# Patient Record
Sex: Female | Born: 1945 | Race: White | Hispanic: No | Marital: Married | Smoking: Never smoker
Health system: Southern US, Community
[De-identification: ages and names within clinical notes are randomized; demographics above are authoritative.]

## PROBLEM LIST (undated history)

## (undated) DIAGNOSIS — F419 Anxiety disorder, unspecified: Secondary | ICD-10-CM

## (undated) DIAGNOSIS — E119 Type 2 diabetes mellitus without complications: Secondary | ICD-10-CM

## (undated) DIAGNOSIS — M542 Cervicalgia: Secondary | ICD-10-CM

## (undated) DIAGNOSIS — H269 Unspecified cataract: Secondary | ICD-10-CM

## (undated) DIAGNOSIS — Z8679 Personal history of other diseases of the circulatory system: Secondary | ICD-10-CM

## (undated) DIAGNOSIS — E7800 Pure hypercholesterolemia, unspecified: Secondary | ICD-10-CM

## (undated) DIAGNOSIS — C801 Malignant (primary) neoplasm, unspecified: Secondary | ICD-10-CM

## (undated) DIAGNOSIS — E039 Hypothyroidism, unspecified: Secondary | ICD-10-CM

## (undated) DIAGNOSIS — K589 Irritable bowel syndrome without diarrhea: Secondary | ICD-10-CM

## (undated) DIAGNOSIS — K56609 Unspecified intestinal obstruction, unspecified as to partial versus complete obstruction: Secondary | ICD-10-CM

## (undated) DIAGNOSIS — K219 Gastro-esophageal reflux disease without esophagitis: Secondary | ICD-10-CM

## (undated) DIAGNOSIS — Z9889 Other specified postprocedural states: Secondary | ICD-10-CM

## (undated) DIAGNOSIS — C4491 Basal cell carcinoma of skin, unspecified: Secondary | ICD-10-CM

## (undated) DIAGNOSIS — K5792 Diverticulitis of intestine, part unspecified, without perforation or abscess without bleeding: Secondary | ICD-10-CM

## (undated) DIAGNOSIS — R519 Headache, unspecified: Secondary | ICD-10-CM

## (undated) DIAGNOSIS — T7840XA Allergy, unspecified, initial encounter: Secondary | ICD-10-CM

## (undated) DIAGNOSIS — M109 Gout, unspecified: Secondary | ICD-10-CM

## (undated) DIAGNOSIS — M47816 Spondylosis without myelopathy or radiculopathy, lumbar region: Secondary | ICD-10-CM

## (undated) DIAGNOSIS — I2510 Atherosclerotic heart disease of native coronary artery without angina pectoris: Secondary | ICD-10-CM

## (undated) DIAGNOSIS — I10 Essential (primary) hypertension: Secondary | ICD-10-CM

## (undated) DIAGNOSIS — I150 Renovascular hypertension: Secondary | ICD-10-CM

## (undated) DIAGNOSIS — Z87442 Personal history of urinary calculi: Secondary | ICD-10-CM

## (undated) HISTORY — DX: Allergy, unspecified, initial encounter: T78.40XA

## (undated) HISTORY — DX: Hypothyroidism, unspecified: E03.9

## (undated) HISTORY — PX: WRIST GANGLION EXCISION: SUR520

## (undated) HISTORY — PX: EXPLORATORY LAPAROTOMY: SUR591

## (undated) HISTORY — DX: Gastro-esophageal reflux disease without esophagitis: K21.9

## (undated) HISTORY — PX: CARDIAC CATHETERIZATION: SHX172

## (undated) HISTORY — DX: Basal cell carcinoma of skin, unspecified: C44.91

## (undated) HISTORY — DX: Irritable bowel syndrome, unspecified: K58.9

## (undated) HISTORY — PX: CHOLECYSTECTOMY: SHX55

## (undated) HISTORY — PX: BREAST BIOPSY: SHX20

## (undated) HISTORY — PX: APPENDECTOMY: SHX54

## (undated) HISTORY — DX: Unspecified cataract: H26.9

## (undated) HISTORY — PX: BLADDER SURGERY: SHX569

## (undated) HISTORY — DX: Gout, unspecified: M10.9

## (undated) HISTORY — PX: WRIST FRACTURE SURGERY: SHX121

## (undated) HISTORY — PX: ABDOMINAL HYSTERECTOMY: SHX81

## (undated) HISTORY — DX: Anxiety disorder, unspecified: F41.9

## (undated) HISTORY — DX: Spondylosis without myelopathy or radiculopathy, lumbar region: M47.816

## (undated) HISTORY — DX: Cervicalgia: M54.2

## (undated) HISTORY — PX: TONSILLECTOMY: SUR1361

## (undated) HISTORY — PX: EYE SURGERY: SHX253

## (undated) HISTORY — PX: FRACTURE SURGERY: SHX138

## (undated) HISTORY — PX: HERNIA REPAIR: SHX51

---

## 1997-08-07 ENCOUNTER — Emergency Department (HOSPITAL_COMMUNITY): Admission: EM | Admit: 1997-08-07 | Discharge: 1997-08-07 | Payer: Self-pay | Admitting: Emergency Medicine

## 1997-09-18 ENCOUNTER — Emergency Department (HOSPITAL_COMMUNITY): Admission: EM | Admit: 1997-09-18 | Discharge: 1997-09-18 | Payer: Self-pay | Admitting: Emergency Medicine

## 1998-05-31 ENCOUNTER — Ambulatory Visit (HOSPITAL_COMMUNITY): Admission: RE | Admit: 1998-05-31 | Discharge: 1998-05-31 | Payer: Self-pay | Admitting: Cardiology

## 1998-05-31 ENCOUNTER — Encounter: Payer: Self-pay | Admitting: Cardiology

## 1999-12-12 ENCOUNTER — Ambulatory Visit (HOSPITAL_COMMUNITY): Admission: RE | Admit: 1999-12-12 | Discharge: 1999-12-12 | Payer: Self-pay | Admitting: Internal Medicine

## 1999-12-12 ENCOUNTER — Encounter: Payer: Self-pay | Admitting: Internal Medicine

## 2000-07-04 ENCOUNTER — Encounter: Admission: RE | Admit: 2000-07-04 | Discharge: 2000-07-04 | Payer: Self-pay | Admitting: *Deleted

## 2000-07-04 ENCOUNTER — Encounter: Payer: Self-pay | Admitting: *Deleted

## 2000-12-18 ENCOUNTER — Encounter: Payer: Self-pay | Admitting: Internal Medicine

## 2000-12-18 ENCOUNTER — Ambulatory Visit (HOSPITAL_COMMUNITY): Admission: RE | Admit: 2000-12-18 | Discharge: 2000-12-18 | Payer: Self-pay | Admitting: Internal Medicine

## 2002-01-04 ENCOUNTER — Encounter: Payer: Self-pay | Admitting: Internal Medicine

## 2002-01-04 ENCOUNTER — Ambulatory Visit (HOSPITAL_COMMUNITY): Admission: RE | Admit: 2002-01-04 | Discharge: 2002-01-04 | Payer: Self-pay | Admitting: Internal Medicine

## 2003-01-16 ENCOUNTER — Emergency Department (HOSPITAL_COMMUNITY): Admission: EM | Admit: 2003-01-16 | Discharge: 2003-01-16 | Payer: Self-pay | Admitting: Emergency Medicine

## 2006-05-01 ENCOUNTER — Ambulatory Visit (HOSPITAL_COMMUNITY): Admission: RE | Admit: 2006-05-01 | Discharge: 2006-05-02 | Payer: Self-pay | Admitting: Surgery

## 2006-06-30 ENCOUNTER — Encounter: Admission: RE | Admit: 2006-06-30 | Discharge: 2006-06-30 | Payer: Self-pay | Admitting: Internal Medicine

## 2010-03-18 ENCOUNTER — Encounter: Payer: Self-pay | Admitting: Surgery

## 2010-03-18 ENCOUNTER — Encounter: Payer: Self-pay | Admitting: Internal Medicine

## 2016-03-27 DIAGNOSIS — Z79899 Other long term (current) drug therapy: Secondary | ICD-10-CM | POA: Diagnosis not present

## 2016-03-27 DIAGNOSIS — D649 Anemia, unspecified: Secondary | ICD-10-CM | POA: Diagnosis not present

## 2016-03-27 DIAGNOSIS — E039 Hypothyroidism, unspecified: Secondary | ICD-10-CM | POA: Diagnosis not present

## 2016-03-27 DIAGNOSIS — E559 Vitamin D deficiency, unspecified: Secondary | ICD-10-CM | POA: Diagnosis not present

## 2016-03-27 DIAGNOSIS — E119 Type 2 diabetes mellitus without complications: Secondary | ICD-10-CM | POA: Diagnosis not present

## 2016-03-27 DIAGNOSIS — E8881 Metabolic syndrome: Secondary | ICD-10-CM | POA: Diagnosis not present

## 2016-03-27 DIAGNOSIS — E785 Hyperlipidemia, unspecified: Secondary | ICD-10-CM | POA: Diagnosis not present

## 2016-04-02 DIAGNOSIS — E1140 Type 2 diabetes mellitus with diabetic neuropathy, unspecified: Secondary | ICD-10-CM | POA: Diagnosis not present

## 2016-04-02 DIAGNOSIS — I10 Essential (primary) hypertension: Secondary | ICD-10-CM | POA: Diagnosis not present

## 2016-04-02 DIAGNOSIS — E559 Vitamin D deficiency, unspecified: Secondary | ICD-10-CM | POA: Diagnosis not present

## 2016-04-02 DIAGNOSIS — E785 Hyperlipidemia, unspecified: Secondary | ICD-10-CM | POA: Diagnosis not present

## 2016-04-02 DIAGNOSIS — G8929 Other chronic pain: Secondary | ICD-10-CM | POA: Diagnosis not present

## 2016-04-02 DIAGNOSIS — Z79899 Other long term (current) drug therapy: Secondary | ICD-10-CM | POA: Diagnosis not present

## 2016-04-02 DIAGNOSIS — E7212 Methylenetetrahydrofolate reductase deficiency: Secondary | ICD-10-CM | POA: Diagnosis not present

## 2016-04-03 DIAGNOSIS — R531 Weakness: Secondary | ICD-10-CM | POA: Diagnosis not present

## 2016-04-03 DIAGNOSIS — M545 Low back pain: Secondary | ICD-10-CM | POA: Diagnosis not present

## 2016-04-10 DIAGNOSIS — R531 Weakness: Secondary | ICD-10-CM | POA: Diagnosis not present

## 2016-04-10 DIAGNOSIS — M545 Low back pain: Secondary | ICD-10-CM | POA: Diagnosis not present

## 2016-04-12 DIAGNOSIS — M545 Low back pain: Secondary | ICD-10-CM | POA: Diagnosis not present

## 2016-04-12 DIAGNOSIS — R531 Weakness: Secondary | ICD-10-CM | POA: Diagnosis not present

## 2016-04-23 DIAGNOSIS — M545 Low back pain: Secondary | ICD-10-CM | POA: Diagnosis not present

## 2016-04-23 DIAGNOSIS — R531 Weakness: Secondary | ICD-10-CM | POA: Diagnosis not present

## 2016-04-25 DIAGNOSIS — M545 Low back pain: Secondary | ICD-10-CM | POA: Diagnosis not present

## 2016-04-25 DIAGNOSIS — R531 Weakness: Secondary | ICD-10-CM | POA: Diagnosis not present

## 2016-05-07 DIAGNOSIS — R531 Weakness: Secondary | ICD-10-CM | POA: Diagnosis not present

## 2016-05-07 DIAGNOSIS — M545 Low back pain: Secondary | ICD-10-CM | POA: Diagnosis not present

## 2016-05-13 DIAGNOSIS — K5730 Diverticulosis of large intestine without perforation or abscess without bleeding: Secondary | ICD-10-CM | POA: Diagnosis not present

## 2016-05-13 DIAGNOSIS — N200 Calculus of kidney: Secondary | ICD-10-CM | POA: Diagnosis not present

## 2016-05-13 DIAGNOSIS — R1084 Generalized abdominal pain: Secondary | ICD-10-CM | POA: Diagnosis not present

## 2016-05-14 DIAGNOSIS — R531 Weakness: Secondary | ICD-10-CM | POA: Diagnosis not present

## 2016-05-14 DIAGNOSIS — M545 Low back pain: Secondary | ICD-10-CM | POA: Diagnosis not present

## 2016-05-18 DIAGNOSIS — M545 Low back pain: Secondary | ICD-10-CM | POA: Diagnosis not present

## 2016-05-18 DIAGNOSIS — R531 Weakness: Secondary | ICD-10-CM | POA: Diagnosis not present

## 2016-05-23 DIAGNOSIS — R531 Weakness: Secondary | ICD-10-CM | POA: Diagnosis not present

## 2016-05-23 DIAGNOSIS — M545 Low back pain: Secondary | ICD-10-CM | POA: Diagnosis not present

## 2016-05-27 DIAGNOSIS — M5126 Other intervertebral disc displacement, lumbar region: Secondary | ICD-10-CM | POA: Diagnosis not present

## 2016-05-27 DIAGNOSIS — M438X6 Other specified deforming dorsopathies, lumbar region: Secondary | ICD-10-CM | POA: Diagnosis not present

## 2016-05-27 DIAGNOSIS — M4696 Unspecified inflammatory spondylopathy, lumbar region: Secondary | ICD-10-CM | POA: Diagnosis not present

## 2016-05-30 DIAGNOSIS — M545 Low back pain: Secondary | ICD-10-CM | POA: Diagnosis not present

## 2016-05-30 DIAGNOSIS — M4155 Other secondary scoliosis, thoracolumbar region: Secondary | ICD-10-CM | POA: Diagnosis not present

## 2016-05-30 DIAGNOSIS — M542 Cervicalgia: Secondary | ICD-10-CM | POA: Diagnosis not present

## 2016-05-30 DIAGNOSIS — M4036 Flatback syndrome, lumbar region: Secondary | ICD-10-CM | POA: Diagnosis not present

## 2016-06-04 DIAGNOSIS — R1084 Generalized abdominal pain: Secondary | ICD-10-CM | POA: Diagnosis not present

## 2016-06-04 DIAGNOSIS — K5730 Diverticulosis of large intestine without perforation or abscess without bleeding: Secondary | ICD-10-CM | POA: Diagnosis not present

## 2016-06-05 DIAGNOSIS — R531 Weakness: Secondary | ICD-10-CM | POA: Diagnosis not present

## 2016-06-05 DIAGNOSIS — M545 Low back pain: Secondary | ICD-10-CM | POA: Diagnosis not present

## 2016-06-19 DIAGNOSIS — M79604 Pain in right leg: Secondary | ICD-10-CM | POA: Diagnosis not present

## 2016-07-31 DIAGNOSIS — L304 Erythema intertrigo: Secondary | ICD-10-CM | POA: Diagnosis not present

## 2016-07-31 DIAGNOSIS — L570 Actinic keratosis: Secondary | ICD-10-CM | POA: Diagnosis not present

## 2016-07-31 DIAGNOSIS — B351 Tinea unguium: Secondary | ICD-10-CM | POA: Diagnosis not present

## 2016-07-31 DIAGNOSIS — L820 Inflamed seborrheic keratosis: Secondary | ICD-10-CM | POA: Diagnosis not present

## 2016-07-31 DIAGNOSIS — L728 Other follicular cysts of the skin and subcutaneous tissue: Secondary | ICD-10-CM | POA: Diagnosis not present

## 2016-08-08 DIAGNOSIS — M1288 Other specific arthropathies, not elsewhere classified, other specified site: Secondary | ICD-10-CM | POA: Diagnosis not present

## 2016-08-08 DIAGNOSIS — N959 Unspecified menopausal and perimenopausal disorder: Secondary | ICD-10-CM | POA: Diagnosis not present

## 2016-08-08 DIAGNOSIS — Z01419 Encounter for gynecological examination (general) (routine) without abnormal findings: Secondary | ICD-10-CM | POA: Diagnosis not present

## 2016-08-08 DIAGNOSIS — I10 Essential (primary) hypertension: Secondary | ICD-10-CM | POA: Diagnosis not present

## 2016-08-13 DIAGNOSIS — M47816 Spondylosis without myelopathy or radiculopathy, lumbar region: Secondary | ICD-10-CM | POA: Diagnosis not present

## 2016-08-13 DIAGNOSIS — M545 Low back pain: Secondary | ICD-10-CM | POA: Diagnosis not present

## 2016-08-13 DIAGNOSIS — G8929 Other chronic pain: Secondary | ICD-10-CM | POA: Diagnosis not present

## 2016-08-13 DIAGNOSIS — M4126 Other idiopathic scoliosis, lumbar region: Secondary | ICD-10-CM | POA: Diagnosis not present

## 2016-08-15 DIAGNOSIS — G8929 Other chronic pain: Secondary | ICD-10-CM | POA: Diagnosis not present

## 2016-08-15 DIAGNOSIS — M47816 Spondylosis without myelopathy or radiculopathy, lumbar region: Secondary | ICD-10-CM | POA: Diagnosis not present

## 2016-09-02 DIAGNOSIS — M47816 Spondylosis without myelopathy or radiculopathy, lumbar region: Secondary | ICD-10-CM | POA: Diagnosis not present

## 2016-09-02 DIAGNOSIS — G8929 Other chronic pain: Secondary | ICD-10-CM | POA: Diagnosis not present

## 2016-09-25 DIAGNOSIS — E559 Vitamin D deficiency, unspecified: Secondary | ICD-10-CM | POA: Diagnosis not present

## 2016-09-25 DIAGNOSIS — E1140 Type 2 diabetes mellitus with diabetic neuropathy, unspecified: Secondary | ICD-10-CM | POA: Diagnosis not present

## 2016-09-27 DIAGNOSIS — E559 Vitamin D deficiency, unspecified: Secondary | ICD-10-CM | POA: Diagnosis not present

## 2016-09-27 DIAGNOSIS — E1165 Type 2 diabetes mellitus with hyperglycemia: Secondary | ICD-10-CM | POA: Diagnosis not present

## 2016-09-27 DIAGNOSIS — E063 Autoimmune thyroiditis: Secondary | ICD-10-CM | POA: Diagnosis not present

## 2016-09-27 DIAGNOSIS — E039 Hypothyroidism, unspecified: Secondary | ICD-10-CM | POA: Diagnosis not present

## 2016-09-27 DIAGNOSIS — E049 Nontoxic goiter, unspecified: Secondary | ICD-10-CM | POA: Diagnosis not present

## 2016-09-27 DIAGNOSIS — Z6831 Body mass index (BMI) 31.0-31.9, adult: Secondary | ICD-10-CM | POA: Diagnosis not present

## 2016-09-30 DIAGNOSIS — M542 Cervicalgia: Secondary | ICD-10-CM | POA: Diagnosis not present

## 2016-09-30 DIAGNOSIS — M47812 Spondylosis without myelopathy or radiculopathy, cervical region: Secondary | ICD-10-CM | POA: Diagnosis not present

## 2016-10-02 DIAGNOSIS — Z0000 Encounter for general adult medical examination without abnormal findings: Secondary | ICD-10-CM | POA: Diagnosis not present

## 2016-10-02 DIAGNOSIS — I10 Essential (primary) hypertension: Secondary | ICD-10-CM | POA: Diagnosis not present

## 2016-10-02 DIAGNOSIS — M62830 Muscle spasm of back: Secondary | ICD-10-CM | POA: Diagnosis not present

## 2016-10-02 DIAGNOSIS — R609 Edema, unspecified: Secondary | ICD-10-CM | POA: Diagnosis not present

## 2016-10-02 DIAGNOSIS — H04129 Dry eye syndrome of unspecified lacrimal gland: Secondary | ICD-10-CM | POA: Diagnosis not present

## 2016-10-02 DIAGNOSIS — E785 Hyperlipidemia, unspecified: Secondary | ICD-10-CM | POA: Diagnosis not present

## 2016-10-03 DIAGNOSIS — R609 Edema, unspecified: Secondary | ICD-10-CM | POA: Diagnosis not present

## 2016-10-03 DIAGNOSIS — E785 Hyperlipidemia, unspecified: Secondary | ICD-10-CM | POA: Diagnosis not present

## 2016-10-03 DIAGNOSIS — I10 Essential (primary) hypertension: Secondary | ICD-10-CM | POA: Diagnosis not present

## 2016-10-03 DIAGNOSIS — M62830 Muscle spasm of back: Secondary | ICD-10-CM | POA: Diagnosis not present

## 2016-10-18 DIAGNOSIS — L508 Other urticaria: Secondary | ICD-10-CM | POA: Diagnosis not present

## 2016-10-18 DIAGNOSIS — L728 Other follicular cysts of the skin and subcutaneous tissue: Secondary | ICD-10-CM | POA: Diagnosis not present

## 2016-10-31 DIAGNOSIS — G4489 Other headache syndrome: Secondary | ICD-10-CM | POA: Diagnosis not present

## 2016-10-31 DIAGNOSIS — M47812 Spondylosis without myelopathy or radiculopathy, cervical region: Secondary | ICD-10-CM | POA: Diagnosis not present

## 2016-10-31 DIAGNOSIS — M542 Cervicalgia: Secondary | ICD-10-CM | POA: Diagnosis not present

## 2016-10-31 DIAGNOSIS — R51 Headache: Secondary | ICD-10-CM | POA: Diagnosis not present

## 2016-11-04 DIAGNOSIS — E039 Hypothyroidism, unspecified: Secondary | ICD-10-CM | POA: Diagnosis not present

## 2016-11-20 DIAGNOSIS — G43011 Migraine without aura, intractable, with status migrainosus: Secondary | ICD-10-CM | POA: Diagnosis not present

## 2016-11-20 DIAGNOSIS — M9901 Segmental and somatic dysfunction of cervical region: Secondary | ICD-10-CM | POA: Diagnosis not present

## 2016-12-10 DIAGNOSIS — M531 Cervicobrachial syndrome: Secondary | ICD-10-CM | POA: Diagnosis not present

## 2016-12-10 DIAGNOSIS — M9901 Segmental and somatic dysfunction of cervical region: Secondary | ICD-10-CM | POA: Diagnosis not present

## 2016-12-31 DIAGNOSIS — H10412 Chronic giant papillary conjunctivitis, left eye: Secondary | ICD-10-CM | POA: Diagnosis not present

## 2016-12-31 DIAGNOSIS — H1045 Other chronic allergic conjunctivitis: Secondary | ICD-10-CM | POA: Diagnosis not present

## 2016-12-31 DIAGNOSIS — Z961 Presence of intraocular lens: Secondary | ICD-10-CM | POA: Diagnosis not present

## 2017-01-07 DIAGNOSIS — E039 Hypothyroidism, unspecified: Secondary | ICD-10-CM | POA: Diagnosis not present

## 2017-02-03 DIAGNOSIS — E039 Hypothyroidism, unspecified: Secondary | ICD-10-CM | POA: Diagnosis not present

## 2017-02-05 DIAGNOSIS — Z961 Presence of intraocular lens: Secondary | ICD-10-CM | POA: Diagnosis not present

## 2017-02-05 DIAGNOSIS — H1045 Other chronic allergic conjunctivitis: Secondary | ICD-10-CM | POA: Diagnosis not present

## 2017-02-05 DIAGNOSIS — H10403 Unspecified chronic conjunctivitis, bilateral: Secondary | ICD-10-CM | POA: Diagnosis not present

## 2017-02-05 DIAGNOSIS — Z9841 Cataract extraction status, right eye: Secondary | ICD-10-CM | POA: Diagnosis not present

## 2017-02-10 DIAGNOSIS — E559 Vitamin D deficiency, unspecified: Secondary | ICD-10-CM | POA: Diagnosis not present

## 2017-02-10 DIAGNOSIS — E049 Nontoxic goiter, unspecified: Secondary | ICD-10-CM | POA: Diagnosis not present

## 2017-02-10 DIAGNOSIS — Z6830 Body mass index (BMI) 30.0-30.9, adult: Secondary | ICD-10-CM | POA: Diagnosis not present

## 2017-02-10 DIAGNOSIS — E039 Hypothyroidism, unspecified: Secondary | ICD-10-CM | POA: Diagnosis not present

## 2017-02-10 DIAGNOSIS — E1165 Type 2 diabetes mellitus with hyperglycemia: Secondary | ICD-10-CM | POA: Diagnosis not present

## 2017-02-10 DIAGNOSIS — E063 Autoimmune thyroiditis: Secondary | ICD-10-CM | POA: Diagnosis not present

## 2017-03-12 DIAGNOSIS — Z1231 Encounter for screening mammogram for malignant neoplasm of breast: Secondary | ICD-10-CM | POA: Diagnosis not present

## 2017-03-24 DIAGNOSIS — E039 Hypothyroidism, unspecified: Secondary | ICD-10-CM | POA: Diagnosis not present

## 2017-04-03 DIAGNOSIS — H538 Other visual disturbances: Secondary | ICD-10-CM | POA: Diagnosis not present

## 2017-04-03 DIAGNOSIS — H10413 Chronic giant papillary conjunctivitis, bilateral: Secondary | ICD-10-CM | POA: Diagnosis not present

## 2017-04-30 DIAGNOSIS — B351 Tinea unguium: Secondary | ICD-10-CM | POA: Diagnosis not present

## 2017-04-30 DIAGNOSIS — L570 Actinic keratosis: Secondary | ICD-10-CM | POA: Diagnosis not present

## 2017-04-30 DIAGNOSIS — L650 Telogen effluvium: Secondary | ICD-10-CM | POA: Diagnosis not present

## 2017-04-30 DIAGNOSIS — L728 Other follicular cysts of the skin and subcutaneous tissue: Secondary | ICD-10-CM | POA: Diagnosis not present

## 2017-06-23 DIAGNOSIS — R109 Unspecified abdominal pain: Secondary | ICD-10-CM | POA: Diagnosis not present

## 2017-06-23 DIAGNOSIS — Z6830 Body mass index (BMI) 30.0-30.9, adult: Secondary | ICD-10-CM | POA: Diagnosis not present

## 2017-06-23 DIAGNOSIS — K5792 Diverticulitis of intestine, part unspecified, without perforation or abscess without bleeding: Secondary | ICD-10-CM | POA: Diagnosis not present

## 2017-06-26 ENCOUNTER — Emergency Department (HOSPITAL_COMMUNITY)
Admission: EM | Admit: 2017-06-26 | Discharge: 2017-06-26 | Disposition: A | Discharge status: Home / Self Care | Payer: Medicare Other | Attending: Emergency Medicine | Admitting: Emergency Medicine

## 2017-06-26 ENCOUNTER — Encounter (HOSPITAL_COMMUNITY): Payer: Self-pay | Admitting: Emergency Medicine

## 2017-06-26 ENCOUNTER — Emergency Department (HOSPITAL_COMMUNITY): Payer: Medicare Other

## 2017-06-26 DIAGNOSIS — R197 Diarrhea, unspecified: Secondary | ICD-10-CM

## 2017-06-26 DIAGNOSIS — Z79899 Other long term (current) drug therapy: Secondary | ICD-10-CM | POA: Insufficient documentation

## 2017-06-26 DIAGNOSIS — R1032 Left lower quadrant pain: Secondary | ICD-10-CM | POA: Diagnosis not present

## 2017-06-26 DIAGNOSIS — N200 Calculus of kidney: Secondary | ICD-10-CM | POA: Diagnosis not present

## 2017-06-26 DIAGNOSIS — R112 Nausea with vomiting, unspecified: Secondary | ICD-10-CM | POA: Diagnosis not present

## 2017-06-26 DIAGNOSIS — E119 Type 2 diabetes mellitus without complications: Secondary | ICD-10-CM | POA: Diagnosis not present

## 2017-06-26 DIAGNOSIS — I10 Essential (primary) hypertension: Secondary | ICD-10-CM | POA: Insufficient documentation

## 2017-06-26 DIAGNOSIS — A084 Viral intestinal infection, unspecified: Secondary | ICD-10-CM | POA: Diagnosis not present

## 2017-06-26 DIAGNOSIS — Z91040 Latex allergy status: Secondary | ICD-10-CM | POA: Diagnosis not present

## 2017-06-26 DIAGNOSIS — Z7984 Long term (current) use of oral hypoglycemic drugs: Secondary | ICD-10-CM | POA: Diagnosis not present

## 2017-06-26 HISTORY — DX: Type 2 diabetes mellitus without complications: E11.9

## 2017-06-26 HISTORY — DX: Pure hypercholesterolemia, unspecified: E78.00

## 2017-06-26 HISTORY — DX: Essential (primary) hypertension: I10

## 2017-06-26 HISTORY — DX: Diverticulitis of intestine, part unspecified, without perforation or abscess without bleeding: K57.92

## 2017-06-26 HISTORY — DX: Unspecified intestinal obstruction, unspecified as to partial versus complete obstruction: K56.609

## 2017-06-26 LAB — CBC
HCT: 42.6 % (ref 36.0–46.0)
Hemoglobin: 14.3 g/dL (ref 12.0–15.0)
MCH: 29.5 pg (ref 26.0–34.0)
MCHC: 33.6 g/dL (ref 30.0–36.0)
MCV: 88 fL (ref 78.0–100.0)
Platelets: 321 10*3/uL (ref 150–400)
RBC: 4.84 MIL/uL (ref 3.87–5.11)
RDW: 13.5 % (ref 11.5–15.5)
WBC: 10.1 10*3/uL (ref 4.0–10.5)

## 2017-06-26 LAB — COMPREHENSIVE METABOLIC PANEL
ALT: 16 U/L (ref 14–54)
AST: 24 U/L (ref 15–41)
Albumin: 4.3 g/dL (ref 3.5–5.0)
Alkaline Phosphatase: 88 U/L (ref 38–126)
Anion gap: 12 (ref 5–15)
BUN: 18 mg/dL (ref 6–20)
CO2: 22 mmol/L (ref 22–32)
Calcium: 10.3 mg/dL (ref 8.9–10.3)
Chloride: 102 mmol/L (ref 101–111)
Creatinine, Ser: 0.8 mg/dL (ref 0.44–1.00)
GFR calc Af Amer: >60 mL/min (ref >60)
GFR calc non Af Amer: >60 mL/min (ref >60)
Glucose, Bld: 109 mg/dL — ABNORMAL HIGH (ref 65–99)
Potassium: 4.2 mmol/L (ref 3.5–5.1)
Sodium: 136 mmol/L (ref 135–145)
Total Bilirubin: 1.1 mg/dL (ref 0.3–1.2)
Total Protein: 8.4 g/dL — ABNORMAL HIGH (ref 6.5–8.1)

## 2017-06-26 LAB — URINALYSIS, ROUTINE W REFLEX MICROSCOPIC
Bilirubin Urine: NEGATIVE
Glucose, UA: NEGATIVE mg/dL
Ketones, ur: 20 mg/dL — AB
Nitrite: NEGATIVE
Protein, ur: NEGATIVE mg/dL
RBC / HPF: >50 RBC/hpf — ABNORMAL HIGH (ref 0–5)
Specific Gravity, Urine: 1.018 (ref 1.005–1.030)
pH: 5 (ref 5.0–8.0)

## 2017-06-26 LAB — LIPASE, BLOOD: Lipase: 31 U/L (ref 11–51)

## 2017-06-26 MED ORDER — LORAZEPAM 2 MG/ML IJ SOLN
0.5000 mg | Freq: Once | INTRAMUSCULAR | Status: DC
  Filled 2017-06-26: qty 1

## 2017-06-26 MED ORDER — METOCLOPRAMIDE HCL 5 MG/ML IJ SOLN
10.0000 mg | Freq: Once | INTRAMUSCULAR | Status: AC
  Administered 2017-06-26: 10 mg via INTRAVENOUS
  Filled 2017-06-26: qty 2

## 2017-06-26 MED ORDER — ONDANSETRON 4 MG PO TBDP
4.0000 mg | ORAL_TABLET | Freq: Once | ORAL | Status: AC | PRN
  Administered 2017-06-26: 4 mg via ORAL
  Filled 2017-06-26: qty 1

## 2017-06-26 MED ORDER — ONDANSETRON HCL 4 MG/2ML IJ SOLN
4.0000 mg | Freq: Once | INTRAMUSCULAR | Status: AC
  Administered 2017-06-26: 4 mg via INTRAVENOUS
  Filled 2017-06-26: qty 2

## 2017-06-26 MED ORDER — IOPAMIDOL (ISOVUE-300) INJECTION 61%
INTRAVENOUS | Status: AC
  Filled 2017-06-26: qty 100

## 2017-06-26 MED ORDER — ONDANSETRON 4 MG PO TBDP: 4.0000 mg | ORAL_TABLET | Freq: Three times a day (TID) | ORAL | 0 refills | Status: DC | PRN

## 2017-06-26 MED ORDER — MORPHINE SULFATE (PF) 4 MG/ML IV SOLN
4.0000 mg | Freq: Once | INTRAVENOUS | Status: AC
  Administered 2017-06-26: 4 mg via INTRAVENOUS
  Filled 2017-06-26: qty 1

## 2017-06-26 MED ORDER — SODIUM CHLORIDE 0.9 % IJ SOLN
INTRAMUSCULAR | Status: AC
  Filled 2017-06-26: qty 50

## 2017-06-26 MED ORDER — SODIUM CHLORIDE 0.9 % IV BOLUS
1000.0000 mL | Freq: Once | INTRAVENOUS | Status: AC
Start: 2017-06-26 — End: 2017-06-26
  Administered 2017-06-26: 1000 mL via INTRAVENOUS

## 2017-06-26 NOTE — ED Provider Notes (Signed)
[facility] DEPT Provider Note   CSN: [id number] Arrival date & time: 06/26/17  1446     History   Chief Complaint Chief Complaint  Patient presents with  . Abdominal Pain  . Emesis    HPI Jillian [name] is a 72 y.o. female.  HPI   Hx of diverticulitis and SBO Started flagyl/doxycycline, bentyl on Monday due to concern for possible diverticulitis Has had diarrhea, loud stomach sounds, is passing flatus Abdominal pain is diffuse, worse on the left, 7/10 pain Began Friday, has continued and worsened Nausea started yesterday, threw up a few times, no blood, no black or bloody stools No fever, no urinary symptoms or dysuria Diarrhea every hour, is calming down some today compared to yesterday  Past Medical History:  Diagnosis Date  . Bowel obstruction ([place])   . Diabetes mellitus without complication ([place])   . Diverticulitis   . High cholesterol   . Hypertension     There are no active problems to display for this patient.   Past Surgical History:  Procedure Laterality Date  . ABDOMINAL HYSTERECTOMY    . CARDIAC CATHETERIZATION    . CHOLECYSTECTOMY    . FRACTURE SURGERY    . HERNIA REPAIR    . TONSILLECTOMY    . WRIST FRACTURE SURGERY Right      OB History   None      Home Medications    Prior to Admission medications   Medication Sig Start Date End Date Taking? Authorizing Provider  cetirizine (ZYRTEC) 10 MG tablet Take 10 mg by mouth daily as needed for allergies.   Yes [provider]  cyclobenzaprine (FLEXERIL) 10 MG tablet Take 10 mg by mouth 3 (three) times daily as needed for muscle spasms.   Yes [provider]  Fexofenadine HCl (ALLEGRA ALLERGY PO) Take 1 tablet by mouth daily as needed (allergies).   Yes [provider]  ibuprofen (ADVIL,MOTRIN) 200 MG tablet Take 200 mg by mouth 3 (three) times daily as needed for moderate pain.   Yes [provider]  levothyroxine (SYNTHROID,  LEVOTHROID) 200 MCG tablet Take 200 mcg by mouth as directed. Take 1 tablet on Mon thru Sat and Hold on Sunday.   Yes [provider]  metFORMIN (GLUCOPHAGE-XR) 500 MG 24 hr tablet Take 1,000 mg by mouth 2 (two) times daily.   Yes [provider]  rosuvastatin (CRESTOR) 20 MG tablet Take 20 mg by mouth daily.   Yes [provider]  telmisartan (MICARDIS) 40 MG tablet Take 40 mg by mouth daily.   Yes [provider]  ondansetron (ZOFRAN ODT) 4 MG disintegrating tablet Take 1 tablet (4 mg total) by mouth every 8 (eight) hours as needed for nausea or vomiting. 06/26/17   Gareth Morgan, MD    Family History No family history on file.  Social History Social History   Tobacco Use  . Smoking status: Never Smoker  . Smokeless tobacco: Never Used  Substance Use Topics  . Alcohol use: Not on file  . Drug use: Not on file     Allergies   Amoxicillin; Iodinated diagnostic agents; Luminal [phenobarbital]; Mobic [meloxicam]; Penicillins; Allopurinol; Ciprofloxacin; and Latex   Review of Systems Review of Systems  Constitutional: Negative for fever.  HENT: Negative for sore throat.   Eyes: Negative for visual disturbance.  Respiratory: Negative for cough and shortness of breath.   Cardiovascular: Negative for chest pain.  Gastrointestinal: Positive for abdominal pain, diarrhea, nausea and vomiting.  Genitourinary: Negative for difficulty urinating and dysuria.  Musculoskeletal: Negative for back pain and neck pain.  Skin: Negative for rash.  Neurological: Negative for syncope and headaches.     Physical Exam Updated Vital Signs BP (!) 156/70 (BP Location: Right Arm)   Pulse 90   Temp 97.8 F (36.6 C) (Oral)   Resp 18   Ht 5\' 4"  (1.626 m)   Wt 78.9 kg (174 lb)   SpO2 100%   BMI 29.87 kg/m   Physical Exam  Constitutional: She is oriented to person, place, and time. She appears well-developed and well-nourished. No distress.  HENT:  Head:  Normocephalic and atraumatic.  Eyes: Conjunctivae and EOM are normal.  Neck: Normal range of motion.  Cardiovascular: Normal rate, regular rhythm, normal heart sounds and intact distal pulses. Exam reveals no gallop and no friction rub.  No murmur heard. Pulmonary/Chest: Effort normal and breath sounds normal. No respiratory distress. She has no wheezes. She has no rales.  Abdominal: Soft. She exhibits no distension. There is tenderness in the suprapubic area and left lower quadrant. There is guarding.  Musculoskeletal: She exhibits no edema or tenderness.  Neurological: She is alert and oriented to person, place, and time.  Skin: Skin is warm and dry. No rash noted. She is not diaphoretic. No erythema.  Nursing note and vitals reviewed.    ED Treatments / Results  Labs (all labs ordered are listed, but only abnormal results are displayed) Labs Reviewed  COMPREHENSIVE METABOLIC PANEL - Abnormal; Notable for the following components:      Result Value   Glucose, Bld 109 (*)    Total Protein 8.4 (*)    All other components within normal limits  URINALYSIS, ROUTINE W REFLEX MICROSCOPIC - Abnormal; Notable for the following components:   APPearance HAZY (*)    Hgb urine dipstick LARGE (*)    Ketones, ur 20 (*)    Leukocytes, UA TRACE (*)    RBC / HPF >50 (*)    Bacteria, UA RARE (*)    All other components within normal limits  URINE CULTURE  LIPASE, BLOOD  CBC    EKG None  Radiology Ct Abdomen Pelvis Wo Contrast  Result Date: 06/26/2017 CLINICAL DATA:  Pt reports abd pains with n/v/d started on Monday. Was started on doxycyline, flagyl and bentyl Monday night. Hx diverticulitis and bowel obstruction. PO Barium given EXAM: CT ABDOMEN AND PELVIS WITHOUT CONTRAST TECHNIQUE: Multidetector CT imaging of the abdomen and pelvis was performed following the standard protocol without IV contrast. COMPARISON:  01/16/2003 FINDINGS: Lower chest: Heart size is normal. No pericardial effusion  or significant coronary artery calcifications. No pulmonary nodules, pleural effusions, or infiltrates. Hepatobiliary: Status post cholecystectomy. The liver is homogeneous. There is focal fatty infiltration adjacent to the falciform ligament. No suspicious liver lesions. Pancreas: Unremarkable. No pancreatic ductal dilatation or surrounding inflammatory changes. Spleen: Normal in size without focal abnormality. Adrenals/Urinary Tract: The adrenal glands are normal in appearance. There are intrarenal calculi bilaterally, largest measuring approximately 7 millimeters in the UPPER pole of the RIGHT kidney. There is no hydronephrosis. Urinary bladder is unremarkable. Stomach/Bowel: The stomach and small bowel loops are normal in appearance. There is significant colonic diverticulosis, particularly involving the sigmoid colon. There is no associated inflammation or abscess. Vascular/Lymphatic: There is atherosclerotic associated with aneurysm calcification of the abdominal aorta not. No retroperitoneal or mesenteric adenopathy. Reproductive: Hysterectomy.  No adnexal mass. Other: No free pelvic fluid. Interval anterior abdominal wall hernia repair. Musculoskeletal: Scoliosis  and degenerative changes in the lumbar spine. There are degenerative changes in both hips. No suspicious lytic or blastic lesions are identified. IMPRESSION: 1. Nephrolithiasis.  No obstructing stone. 2. Colonic diverticulosis without acute diverticulitis. 3.  Aortic atherosclerosis.  (ICD10-I70.0) 4. Hysterectomy. 5. Scoliosis and lumbar spondylosis. Degenerative changes in both hips. Electronically Signed   By: Nolon Nations M.D.   On: 06/26/2017 21:09    Procedures Procedures (including critical care time)  Medications Ordered in ED Medications  sodium chloride 0.9 % injection (has no administration in time range)  LORazepam (ATIVAN) injection 0.5 mg (0.5 mg Intravenous Refused 06/26/17 2200)  ondansetron (ZOFRAN-ODT) disintegrating  tablet 4 mg (4 mg Oral Given 06/26/17 1527)  sodium chloride 0.9 % bolus 1,000 mL (0 mLs Intravenous Stopped 06/26/17 2301)  morphine 4 MG/ML injection 4 mg (4 mg Intravenous Given 06/26/17 1821)  ondansetron (ZOFRAN) injection 4 mg (4 mg Intravenous Given 06/26/17 1821)  metoCLOPramide (REGLAN) injection 10 mg (10 mg Intravenous Given 06/26/17 2003)  ondansetron (ZOFRAN-ODT) disintegrating tablet 4 mg (4 mg Oral Given 06/26/17 2200)     Initial Impression / Assessment and Plan / ED Course  I have reviewed the triage vital signs and the nursing notes.  Pertinent labs & imaging results that were available during my care of the patient were reviewed by me and considered in my medical decision making (see chart for details).     72 year old female with a history of diabetes, diverticulitis, SBO, hypertension, hyperlipidemia presents with concern for abdominal pain.  Patient had been started on Flagyl, doxycycline for empiric treatment of diverticulitis as an outpatient on Monday, however has had worsening symptoms.  Exam concerning for persisting diverticulitis.  UA without signs of infection. No sign of pancreatitis or hepatitis.  Labs show no significant findings.  CT abdomen pelvis done shows no sign of diverticulitis. Shows nephrolithiasis without obstructing stone.    Suspect infectious etiology of nausea, vomiting, diarrhea and abdominal pain in setting of CT without acute findings.  Recommend discontinuing abx given concern some of her symptoms may be side effects to abx, and possibility this could be viral gastroenteritis. However, discussed if she continues to have symptoms on Monday to have her PCP send stool studies.  Discussed reasons to return in detail.  Tolerating po. Given rx for zofran. Patient discharged in stable condition with understanding of reasons to return.   Final Clinical Impressions(s) / ED Diagnoses   Final diagnoses:  Nausea and vomiting, intractability of vomiting not  specified, unspecified vomiting type  Left lower quadrant pain  Diarrhea of presumed infectious origin    ED Discharge Orders        Ordered    ondansetron (ZOFRAN ODT) 4 MG disintegrating tablet  Every 8 hours PRN     06/26/17 [place], [place], MD 06/27/17 [phone]

## 2017-06-26 NOTE — ED Notes (Signed)
Pt has tried to give a urine sample but has episodes of diarrhea.

## 2017-06-26 NOTE — ED Triage Notes (Signed)
Pt reports abd pains with n/v/d started on Monday. Was started on doxycyline, flagyl and bentyl Monday night. Hx diverticulitis and bowel obstruction.

## 2017-06-26 NOTE — ED Notes (Signed)
Patient reports vomiting despite getting reglan, provider made aware.

## 2017-06-26 NOTE — ED Notes (Signed)
Patient transported to CT 

## 2017-06-28 LAB — URINE CULTURE

## 2017-07-23 DIAGNOSIS — M47816 Spondylosis without myelopathy or radiculopathy, lumbar region: Secondary | ICD-10-CM | POA: Diagnosis not present

## 2017-07-23 DIAGNOSIS — E038 Other specified hypothyroidism: Secondary | ICD-10-CM | POA: Diagnosis not present

## 2017-07-23 DIAGNOSIS — Z6829 Body mass index (BMI) 29.0-29.9, adult: Secondary | ICD-10-CM | POA: Diagnosis not present

## 2017-07-23 DIAGNOSIS — I10 Essential (primary) hypertension: Secondary | ICD-10-CM | POA: Diagnosis not present

## 2017-07-23 DIAGNOSIS — Z1389 Encounter for screening for other disorder: Secondary | ICD-10-CM | POA: Diagnosis not present

## 2017-07-23 DIAGNOSIS — E1169 Type 2 diabetes mellitus with other specified complication: Secondary | ICD-10-CM | POA: Diagnosis not present

## 2017-07-23 DIAGNOSIS — E7849 Other hyperlipidemia: Secondary | ICD-10-CM | POA: Diagnosis not present

## 2017-07-23 DIAGNOSIS — Z8719 Personal history of other diseases of the digestive system: Secondary | ICD-10-CM | POA: Diagnosis not present

## 2017-08-11 DIAGNOSIS — M47812 Spondylosis without myelopathy or radiculopathy, cervical region: Secondary | ICD-10-CM | POA: Diagnosis not present

## 2017-08-11 DIAGNOSIS — M542 Cervicalgia: Secondary | ICD-10-CM | POA: Diagnosis not present

## 2017-08-11 DIAGNOSIS — M47816 Spondylosis without myelopathy or radiculopathy, lumbar region: Secondary | ICD-10-CM | POA: Diagnosis not present

## 2017-09-08 DIAGNOSIS — I10 Essential (primary) hypertension: Secondary | ICD-10-CM | POA: Diagnosis not present

## 2017-09-08 DIAGNOSIS — M47816 Spondylosis without myelopathy or radiculopathy, lumbar region: Secondary | ICD-10-CM | POA: Diagnosis not present

## 2017-09-08 DIAGNOSIS — M542 Cervicalgia: Secondary | ICD-10-CM | POA: Diagnosis not present

## 2017-09-08 DIAGNOSIS — M5481 Occipital neuralgia: Secondary | ICD-10-CM | POA: Diagnosis not present

## 2017-09-08 DIAGNOSIS — Z6830 Body mass index (BMI) 30.0-30.9, adult: Secondary | ICD-10-CM | POA: Diagnosis not present

## 2017-10-02 DIAGNOSIS — E049 Nontoxic goiter, unspecified: Secondary | ICD-10-CM | POA: Diagnosis not present

## 2017-10-02 DIAGNOSIS — E063 Autoimmune thyroiditis: Secondary | ICD-10-CM | POA: Diagnosis not present

## 2017-10-02 DIAGNOSIS — E559 Vitamin D deficiency, unspecified: Secondary | ICD-10-CM | POA: Diagnosis not present

## 2017-10-02 DIAGNOSIS — E039 Hypothyroidism, unspecified: Secondary | ICD-10-CM | POA: Diagnosis not present

## 2017-10-02 DIAGNOSIS — E1165 Type 2 diabetes mellitus with hyperglycemia: Secondary | ICD-10-CM | POA: Diagnosis not present

## 2017-10-02 DIAGNOSIS — Z6830 Body mass index (BMI) 30.0-30.9, adult: Secondary | ICD-10-CM | POA: Diagnosis not present

## 2017-10-17 DIAGNOSIS — M542 Cervicalgia: Secondary | ICD-10-CM | POA: Diagnosis not present

## 2017-10-17 DIAGNOSIS — I10 Essential (primary) hypertension: Secondary | ICD-10-CM | POA: Diagnosis not present

## 2017-10-17 DIAGNOSIS — Z6831 Body mass index (BMI) 31.0-31.9, adult: Secondary | ICD-10-CM | POA: Diagnosis not present

## 2017-12-10 DIAGNOSIS — I10 Essential (primary) hypertension: Secondary | ICD-10-CM | POA: Diagnosis not present

## 2017-12-10 DIAGNOSIS — E038 Other specified hypothyroidism: Secondary | ICD-10-CM | POA: Diagnosis not present

## 2017-12-10 DIAGNOSIS — E1169 Type 2 diabetes mellitus with other specified complication: Secondary | ICD-10-CM | POA: Diagnosis not present

## 2017-12-10 DIAGNOSIS — Z23 Encounter for immunization: Secondary | ICD-10-CM | POA: Diagnosis not present

## 2017-12-10 DIAGNOSIS — E7849 Other hyperlipidemia: Secondary | ICD-10-CM | POA: Diagnosis not present

## 2017-12-10 DIAGNOSIS — E663 Overweight: Secondary | ICD-10-CM | POA: Diagnosis not present

## 2017-12-10 DIAGNOSIS — M47816 Spondylosis without myelopathy or radiculopathy, lumbar region: Secondary | ICD-10-CM | POA: Diagnosis not present

## 2017-12-10 DIAGNOSIS — Z8719 Personal history of other diseases of the digestive system: Secondary | ICD-10-CM | POA: Diagnosis not present

## 2017-12-18 DIAGNOSIS — N6489 Other specified disorders of breast: Secondary | ICD-10-CM | POA: Diagnosis not present

## 2017-12-18 DIAGNOSIS — Z6831 Body mass index (BMI) 31.0-31.9, adult: Secondary | ICD-10-CM | POA: Diagnosis not present

## 2017-12-18 DIAGNOSIS — E669 Obesity, unspecified: Secondary | ICD-10-CM | POA: Diagnosis not present

## 2018-01-20 DIAGNOSIS — Z6832 Body mass index (BMI) 32.0-32.9, adult: Secondary | ICD-10-CM | POA: Diagnosis not present

## 2018-01-20 DIAGNOSIS — K5900 Constipation, unspecified: Secondary | ICD-10-CM | POA: Diagnosis not present

## 2018-01-20 DIAGNOSIS — I10 Essential (primary) hypertension: Secondary | ICD-10-CM | POA: Diagnosis not present

## 2018-01-20 DIAGNOSIS — E039 Hypothyroidism, unspecified: Secondary | ICD-10-CM | POA: Diagnosis not present

## 2018-02-03 DIAGNOSIS — M542 Cervicalgia: Secondary | ICD-10-CM | POA: Diagnosis not present

## 2018-02-03 DIAGNOSIS — M5481 Occipital neuralgia: Secondary | ICD-10-CM | POA: Diagnosis not present

## 2018-02-03 DIAGNOSIS — I10 Essential (primary) hypertension: Secondary | ICD-10-CM | POA: Diagnosis not present

## 2018-02-03 DIAGNOSIS — G4489 Other headache syndrome: Secondary | ICD-10-CM | POA: Diagnosis not present

## 2018-02-04 DIAGNOSIS — M5481 Occipital neuralgia: Secondary | ICD-10-CM | POA: Diagnosis not present

## 2018-02-04 DIAGNOSIS — R51 Headache: Secondary | ICD-10-CM | POA: Diagnosis not present

## 2018-02-04 DIAGNOSIS — M542 Cervicalgia: Secondary | ICD-10-CM | POA: Diagnosis not present

## 2018-02-04 DIAGNOSIS — M4722 Other spondylosis with radiculopathy, cervical region: Secondary | ICD-10-CM | POA: Diagnosis not present

## 2018-04-16 ENCOUNTER — Ambulatory Visit: Payer: Medicare Other | Admitting: Neurology

## 2018-04-28 DIAGNOSIS — R69 Illness, unspecified: Secondary | ICD-10-CM | POA: Diagnosis not present

## 2018-04-29 DIAGNOSIS — N6489 Other specified disorders of breast: Secondary | ICD-10-CM | POA: Diagnosis not present

## 2018-04-29 DIAGNOSIS — E1169 Type 2 diabetes mellitus with other specified complication: Secondary | ICD-10-CM | POA: Diagnosis not present

## 2018-04-29 DIAGNOSIS — M47816 Spondylosis without myelopathy or radiculopathy, lumbar region: Secondary | ICD-10-CM | POA: Diagnosis not present

## 2018-04-29 DIAGNOSIS — Z8719 Personal history of other diseases of the digestive system: Secondary | ICD-10-CM | POA: Diagnosis not present

## 2018-04-29 DIAGNOSIS — K5900 Constipation, unspecified: Secondary | ICD-10-CM | POA: Diagnosis not present

## 2018-04-29 DIAGNOSIS — M542 Cervicalgia: Secondary | ICD-10-CM | POA: Diagnosis not present

## 2018-04-29 DIAGNOSIS — E038 Other specified hypothyroidism: Secondary | ICD-10-CM | POA: Diagnosis not present

## 2018-04-29 DIAGNOSIS — E785 Hyperlipidemia, unspecified: Secondary | ICD-10-CM | POA: Diagnosis not present

## 2018-04-29 DIAGNOSIS — E669 Obesity, unspecified: Secondary | ICD-10-CM | POA: Diagnosis not present

## 2018-04-29 DIAGNOSIS — I10 Essential (primary) hypertension: Secondary | ICD-10-CM | POA: Diagnosis not present

## 2018-05-22 DIAGNOSIS — H6690 Otitis media, unspecified, unspecified ear: Secondary | ICD-10-CM | POA: Diagnosis not present

## 2018-05-22 DIAGNOSIS — I10 Essential (primary) hypertension: Secondary | ICD-10-CM | POA: Diagnosis not present

## 2018-05-22 DIAGNOSIS — H9202 Otalgia, left ear: Secondary | ICD-10-CM | POA: Diagnosis not present

## 2018-07-23 DIAGNOSIS — L821 Other seborrheic keratosis: Secondary | ICD-10-CM | POA: Diagnosis not present

## 2018-07-23 DIAGNOSIS — L570 Actinic keratosis: Secondary | ICD-10-CM | POA: Diagnosis not present

## 2018-07-23 DIAGNOSIS — L719 Rosacea, unspecified: Secondary | ICD-10-CM | POA: Diagnosis not present

## 2018-07-23 DIAGNOSIS — E119 Type 2 diabetes mellitus without complications: Secondary | ICD-10-CM | POA: Diagnosis not present

## 2018-07-23 DIAGNOSIS — L820 Inflamed seborrheic keratosis: Secondary | ICD-10-CM | POA: Diagnosis not present

## 2018-07-27 DIAGNOSIS — E7849 Other hyperlipidemia: Secondary | ICD-10-CM | POA: Diagnosis not present

## 2018-07-27 DIAGNOSIS — E038 Other specified hypothyroidism: Secondary | ICD-10-CM | POA: Diagnosis not present

## 2018-07-27 DIAGNOSIS — E1169 Type 2 diabetes mellitus with other specified complication: Secondary | ICD-10-CM | POA: Diagnosis not present

## 2018-07-28 DIAGNOSIS — I10 Essential (primary) hypertension: Secondary | ICD-10-CM | POA: Diagnosis not present

## 2018-07-28 DIAGNOSIS — R82998 Other abnormal findings in urine: Secondary | ICD-10-CM | POA: Diagnosis not present

## 2018-08-03 DIAGNOSIS — N6489 Other specified disorders of breast: Secondary | ICD-10-CM | POA: Diagnosis not present

## 2018-08-03 DIAGNOSIS — K5900 Constipation, unspecified: Secondary | ICD-10-CM | POA: Diagnosis not present

## 2018-08-03 DIAGNOSIS — I10 Essential (primary) hypertension: Secondary | ICD-10-CM | POA: Diagnosis not present

## 2018-08-03 DIAGNOSIS — E1169 Type 2 diabetes mellitus with other specified complication: Secondary | ICD-10-CM | POA: Diagnosis not present

## 2018-08-03 DIAGNOSIS — Z1331 Encounter for screening for depression: Secondary | ICD-10-CM | POA: Diagnosis not present

## 2018-08-03 DIAGNOSIS — E785 Hyperlipidemia, unspecified: Secondary | ICD-10-CM | POA: Diagnosis not present

## 2018-08-03 DIAGNOSIS — Z0000 Encounter for general adult medical examination without abnormal findings: Secondary | ICD-10-CM | POA: Diagnosis not present

## 2018-08-03 DIAGNOSIS — M47816 Spondylosis without myelopathy or radiculopathy, lumbar region: Secondary | ICD-10-CM | POA: Diagnosis not present

## 2018-08-03 DIAGNOSIS — E039 Hypothyroidism, unspecified: Secondary | ICD-10-CM | POA: Diagnosis not present

## 2018-08-03 DIAGNOSIS — Z1339 Encounter for screening examination for other mental health and behavioral disorders: Secondary | ICD-10-CM | POA: Diagnosis not present

## 2018-08-03 DIAGNOSIS — E669 Obesity, unspecified: Secondary | ICD-10-CM | POA: Diagnosis not present

## 2018-08-03 DIAGNOSIS — M542 Cervicalgia: Secondary | ICD-10-CM | POA: Diagnosis not present

## 2018-09-02 DIAGNOSIS — R350 Frequency of micturition: Secondary | ICD-10-CM | POA: Diagnosis not present

## 2018-09-02 DIAGNOSIS — Z7689 Persons encountering health services in other specified circumstances: Secondary | ICD-10-CM | POA: Diagnosis not present

## 2018-09-29 DIAGNOSIS — L821 Other seborrheic keratosis: Secondary | ICD-10-CM | POA: Diagnosis not present

## 2018-09-29 DIAGNOSIS — D225 Melanocytic nevi of trunk: Secondary | ICD-10-CM | POA: Diagnosis not present

## 2018-09-29 DIAGNOSIS — D485 Neoplasm of uncertain behavior of skin: Secondary | ICD-10-CM | POA: Diagnosis not present

## 2018-09-29 DIAGNOSIS — D2271 Melanocytic nevi of right lower limb, including hip: Secondary | ICD-10-CM | POA: Diagnosis not present

## 2018-09-29 DIAGNOSIS — B078 Other viral warts: Secondary | ICD-10-CM | POA: Diagnosis not present

## 2018-09-29 DIAGNOSIS — D2272 Melanocytic nevi of left lower limb, including hip: Secondary | ICD-10-CM | POA: Diagnosis not present

## 2018-11-11 DIAGNOSIS — R6881 Early satiety: Secondary | ICD-10-CM | POA: Diagnosis not present

## 2018-11-11 DIAGNOSIS — R140 Abdominal distension (gaseous): Secondary | ICD-10-CM | POA: Diagnosis not present

## 2018-11-11 DIAGNOSIS — K219 Gastro-esophageal reflux disease without esophagitis: Secondary | ICD-10-CM | POA: Diagnosis not present

## 2018-11-16 ENCOUNTER — Other Ambulatory Visit: Payer: Self-pay | Admitting: Internal Medicine

## 2018-11-16 ENCOUNTER — Other Ambulatory Visit: Payer: Self-pay

## 2018-11-16 ENCOUNTER — Ambulatory Visit (HOSPITAL_COMMUNITY)
Admission: RE | Admit: 2018-11-16 | Discharge: 2018-11-16 | Disposition: A | Discharge status: Home / Self Care | Payer: Medicare HMO | Source: Ambulatory Visit | Attending: Internal Medicine | Admitting: Internal Medicine

## 2018-11-16 DIAGNOSIS — N200 Calculus of kidney: Secondary | ICD-10-CM | POA: Diagnosis not present

## 2018-11-16 DIAGNOSIS — Z8719 Personal history of other diseases of the digestive system: Secondary | ICD-10-CM | POA: Diagnosis not present

## 2018-11-16 DIAGNOSIS — R3121 Asymptomatic microscopic hematuria: Secondary | ICD-10-CM | POA: Diagnosis not present

## 2018-11-16 DIAGNOSIS — R1084 Generalized abdominal pain: Secondary | ICD-10-CM | POA: Diagnosis not present

## 2018-11-16 DIAGNOSIS — E1169 Type 2 diabetes mellitus with other specified complication: Secondary | ICD-10-CM | POA: Diagnosis not present

## 2018-11-16 DIAGNOSIS — R109 Unspecified abdominal pain: Secondary | ICD-10-CM | POA: Diagnosis not present

## 2018-11-16 DIAGNOSIS — R350 Frequency of micturition: Secondary | ICD-10-CM | POA: Diagnosis not present

## 2018-11-19 DIAGNOSIS — R1084 Generalized abdominal pain: Secondary | ICD-10-CM | POA: Diagnosis not present

## 2018-11-19 DIAGNOSIS — R1900 Intra-abdominal and pelvic swelling, mass and lump, unspecified site: Secondary | ICD-10-CM | POA: Diagnosis not present

## 2018-11-25 ENCOUNTER — Other Ambulatory Visit (HOSPITAL_COMMUNITY): Payer: Self-pay | Admitting: Internal Medicine

## 2018-11-25 DIAGNOSIS — E1169 Type 2 diabetes mellitus with other specified complication: Secondary | ICD-10-CM | POA: Diagnosis not present

## 2018-11-25 DIAGNOSIS — M47816 Spondylosis without myelopathy or radiculopathy, lumbar region: Secondary | ICD-10-CM | POA: Diagnosis not present

## 2018-11-25 DIAGNOSIS — R109 Unspecified abdominal pain: Secondary | ICD-10-CM | POA: Diagnosis not present

## 2018-11-25 DIAGNOSIS — N6489 Other specified disorders of breast: Secondary | ICD-10-CM | POA: Diagnosis not present

## 2018-11-25 DIAGNOSIS — E669 Obesity, unspecified: Secondary | ICD-10-CM | POA: Diagnosis not present

## 2018-11-25 DIAGNOSIS — E039 Hypothyroidism, unspecified: Secondary | ICD-10-CM | POA: Diagnosis not present

## 2018-11-25 DIAGNOSIS — E785 Hyperlipidemia, unspecified: Secondary | ICD-10-CM | POA: Diagnosis not present

## 2018-11-25 DIAGNOSIS — K5900 Constipation, unspecified: Secondary | ICD-10-CM | POA: Diagnosis not present

## 2018-11-25 DIAGNOSIS — M542 Cervicalgia: Secondary | ICD-10-CM | POA: Diagnosis not present

## 2018-11-25 DIAGNOSIS — I10 Essential (primary) hypertension: Secondary | ICD-10-CM | POA: Diagnosis not present

## 2018-11-26 ENCOUNTER — Other Ambulatory Visit: Payer: Self-pay

## 2018-11-26 ENCOUNTER — Ambulatory Visit (HOSPITAL_COMMUNITY): Admission: RE | Admit: 2018-11-26 | Payer: Medicare HMO | Source: Ambulatory Visit

## 2018-11-26 ENCOUNTER — Ambulatory Visit (HOSPITAL_COMMUNITY)
Admission: RE | Admit: 2018-11-26 | Discharge: 2018-11-26 | Disposition: A | Discharge status: Home / Self Care | Payer: Medicare HMO | Source: Ambulatory Visit | Attending: Internal Medicine | Admitting: Internal Medicine

## 2018-11-26 ENCOUNTER — Other Ambulatory Visit (HOSPITAL_COMMUNITY): Payer: Self-pay | Admitting: Internal Medicine

## 2018-11-26 ENCOUNTER — Encounter (HOSPITAL_COMMUNITY): Payer: Self-pay

## 2018-11-26 DIAGNOSIS — K760 Fatty (change of) liver, not elsewhere classified: Secondary | ICD-10-CM | POA: Diagnosis not present

## 2018-11-26 DIAGNOSIS — R1084 Generalized abdominal pain: Secondary | ICD-10-CM | POA: Diagnosis not present

## 2018-11-26 DIAGNOSIS — R109 Unspecified abdominal pain: Secondary | ICD-10-CM

## 2018-11-26 DIAGNOSIS — Z01812 Encounter for preprocedural laboratory examination: Secondary | ICD-10-CM | POA: Diagnosis not present

## 2018-11-26 DIAGNOSIS — Z20828 Contact with and (suspected) exposure to other viral communicable diseases: Secondary | ICD-10-CM | POA: Diagnosis not present

## 2018-11-26 DIAGNOSIS — K5730 Diverticulosis of large intestine without perforation or abscess without bleeding: Secondary | ICD-10-CM | POA: Diagnosis not present

## 2018-11-26 LAB — CREATININE, SERUM
Creatinine, Ser: 1.04 mg/dL — ABNORMAL HIGH (ref 0.44–1.00)
GFR calc Af Amer: >60 mL/min (ref >60)
GFR calc non Af Amer: 54 mL/min — ABNORMAL LOW (ref >60)

## 2018-11-26 MED ORDER — GADOBUTROL 1 MMOL/ML IV SOLN
10.0000 mL | Freq: Once | INTRAVENOUS | Status: AC | PRN
  Administered 2018-11-26: 10 mL via INTRAVENOUS

## 2018-11-30 ENCOUNTER — Other Ambulatory Visit: Payer: Self-pay | Admitting: Internal Medicine

## 2018-12-02 DIAGNOSIS — K2950 Unspecified chronic gastritis without bleeding: Secondary | ICD-10-CM | POA: Diagnosis not present

## 2018-12-02 DIAGNOSIS — K2960 Other gastritis without bleeding: Secondary | ICD-10-CM | POA: Diagnosis not present

## 2018-12-02 DIAGNOSIS — E119 Type 2 diabetes mellitus without complications: Secondary | ICD-10-CM | POA: Diagnosis not present

## 2018-12-02 DIAGNOSIS — R1084 Generalized abdominal pain: Secondary | ICD-10-CM | POA: Diagnosis not present

## 2018-12-02 DIAGNOSIS — K2990 Gastroduodenitis, unspecified, without bleeding: Secondary | ICD-10-CM | POA: Diagnosis not present

## 2018-12-02 DIAGNOSIS — K319 Disease of stomach and duodenum, unspecified: Secondary | ICD-10-CM | POA: Diagnosis not present

## 2018-12-02 DIAGNOSIS — K2980 Duodenitis without bleeding: Secondary | ICD-10-CM | POA: Diagnosis not present

## 2018-12-02 DIAGNOSIS — K449 Diaphragmatic hernia without obstruction or gangrene: Secondary | ICD-10-CM | POA: Diagnosis not present

## 2018-12-07 DIAGNOSIS — E785 Hyperlipidemia, unspecified: Secondary | ICD-10-CM | POA: Diagnosis not present

## 2018-12-07 DIAGNOSIS — E1169 Type 2 diabetes mellitus with other specified complication: Secondary | ICD-10-CM | POA: Diagnosis not present

## 2018-12-07 DIAGNOSIS — M109 Gout, unspecified: Secondary | ICD-10-CM | POA: Diagnosis not present

## 2018-12-07 DIAGNOSIS — K589 Irritable bowel syndrome without diarrhea: Secondary | ICD-10-CM | POA: Diagnosis not present

## 2018-12-07 DIAGNOSIS — N6489 Other specified disorders of breast: Secondary | ICD-10-CM | POA: Diagnosis not present

## 2018-12-07 DIAGNOSIS — E039 Hypothyroidism, unspecified: Secondary | ICD-10-CM | POA: Diagnosis not present

## 2018-12-07 DIAGNOSIS — K219 Gastro-esophageal reflux disease without esophagitis: Secondary | ICD-10-CM | POA: Diagnosis not present

## 2018-12-07 DIAGNOSIS — M542 Cervicalgia: Secondary | ICD-10-CM | POA: Diagnosis not present

## 2018-12-07 DIAGNOSIS — M47816 Spondylosis without myelopathy or radiculopathy, lumbar region: Secondary | ICD-10-CM | POA: Diagnosis not present

## 2018-12-07 DIAGNOSIS — I10 Essential (primary) hypertension: Secondary | ICD-10-CM | POA: Diagnosis not present

## 2018-12-15 DIAGNOSIS — K2930 Chronic superficial gastritis without bleeding: Secondary | ICD-10-CM | POA: Diagnosis not present

## 2018-12-21 DIAGNOSIS — M47816 Spondylosis without myelopathy or radiculopathy, lumbar region: Secondary | ICD-10-CM | POA: Diagnosis not present

## 2018-12-21 DIAGNOSIS — M5136 Other intervertebral disc degeneration, lumbar region: Secondary | ICD-10-CM | POA: Diagnosis not present

## 2018-12-21 DIAGNOSIS — M4125 Other idiopathic scoliosis, thoracolumbar region: Secondary | ICD-10-CM | POA: Diagnosis not present

## 2018-12-21 DIAGNOSIS — M545 Low back pain: Secondary | ICD-10-CM | POA: Diagnosis not present

## 2018-12-25 DIAGNOSIS — B078 Other viral warts: Secondary | ICD-10-CM | POA: Diagnosis not present

## 2018-12-25 DIAGNOSIS — D485 Neoplasm of uncertain behavior of skin: Secondary | ICD-10-CM | POA: Diagnosis not present

## 2018-12-30 DIAGNOSIS — E1169 Type 2 diabetes mellitus with other specified complication: Secondary | ICD-10-CM | POA: Diagnosis not present

## 2018-12-30 DIAGNOSIS — M109 Gout, unspecified: Secondary | ICD-10-CM | POA: Diagnosis not present

## 2018-12-30 DIAGNOSIS — M79602 Pain in left arm: Secondary | ICD-10-CM | POA: Diagnosis not present

## 2019-01-07 DIAGNOSIS — R300 Dysuria: Secondary | ICD-10-CM | POA: Diagnosis not present

## 2019-01-07 DIAGNOSIS — D485 Neoplasm of uncertain behavior of skin: Secondary | ICD-10-CM | POA: Diagnosis not present

## 2019-01-07 DIAGNOSIS — L309 Dermatitis, unspecified: Secondary | ICD-10-CM | POA: Diagnosis not present

## 2019-01-07 DIAGNOSIS — C44311 Basal cell carcinoma of skin of nose: Secondary | ICD-10-CM | POA: Diagnosis not present

## 2019-01-07 DIAGNOSIS — N390 Urinary tract infection, site not specified: Secondary | ICD-10-CM | POA: Diagnosis not present

## 2019-03-08 ENCOUNTER — Ambulatory Visit: Payer: Medicare Other | Attending: Internal Medicine

## 2019-03-08 DIAGNOSIS — Z23 Encounter for immunization: Secondary | ICD-10-CM | POA: Insufficient documentation

## 2019-03-08 NOTE — Progress Notes (Signed)
.     Covid-19 Vaccination Clinic  Name:  Jillian [name]    MRN: [id number] DOB: 08/18/1945  03/08/2019  [name] was observed post Covid-19 immunization for 30 minutes based on pre-vaccination screening without incidence. She was provided with Vaccine Information Sheet and instruction to access the V-Safe system.   [name] was instructed to call 911 with any severe reactions post vaccine: Marland Kitchen Difficulty breathing  . Swelling of your face and throat  . A fast heartbeat  . A bad rash all over your body  . Dizziness and weakness    Immunizations Administered    Name Date Dose VIS Date Route   Pfizer COVID-19 Vaccine 03/08/2019 11:13 AM 0.3 mL 02/05/2019 Intramuscular   Manufacturer: [employer], [employer]   Lot: [id number]   [place]: [id number]

## 2019-03-17 DIAGNOSIS — L988 Other specified disorders of the skin and subcutaneous tissue: Secondary | ICD-10-CM | POA: Diagnosis not present

## 2019-03-17 DIAGNOSIS — C44311 Basal cell carcinoma of skin of nose: Secondary | ICD-10-CM | POA: Diagnosis not present

## 2019-03-17 DIAGNOSIS — L578 Other skin changes due to chronic exposure to nonionizing radiation: Secondary | ICD-10-CM | POA: Diagnosis not present

## 2019-03-17 DIAGNOSIS — L814 Other melanin hyperpigmentation: Secondary | ICD-10-CM | POA: Diagnosis not present

## 2019-03-26 ENCOUNTER — Ambulatory Visit: Payer: Medicare Other

## 2019-03-26 DIAGNOSIS — L298 Other pruritus: Secondary | ICD-10-CM | POA: Diagnosis not present

## 2019-03-27 ENCOUNTER — Ambulatory Visit: Payer: Medicare HMO | Attending: Internal Medicine

## 2019-03-27 DIAGNOSIS — Z23 Encounter for immunization: Secondary | ICD-10-CM | POA: Insufficient documentation

## 2019-03-27 NOTE — Progress Notes (Signed)
   Covid-19 Vaccination Clinic  Name:  Jillian [name]    MRN: [id number] DOB: 1946-01-29  03/27/2019  [name] was observed post Covid-19 immunization for 30 minutes based on pre-vaccination screening without incidence. She was provided with Vaccine Information Sheet and instruction to access the V-Safe system.   [name] was instructed to call 911 with any severe reactions post vaccine: Marland Kitchen Difficulty breathing  . Swelling of your face and throat  . A fast heartbeat  . A bad rash all over your body  . Dizziness and weakness    Immunizations Administered    Name Date Dose VIS Date Route   Pfizer COVID-19 Vaccine 03/27/2019  1:36 PM 0.3 mL 02/05/2019 Intramuscular   Manufacturer: [employer]   Lot: [id number]   [place]: [id number]

## 2019-03-28 ENCOUNTER — Telehealth: Payer: Self-pay | Admitting: Internal Medicine

## 2019-03-28 NOTE — Telephone Encounter (Signed)
Pt called and stated that she had 2nd covid vaccine and had no reactions. Pt thinks this is strange and would like to know if vaccine is good. Please advise

## 2019-03-28 NOTE — Telephone Encounter (Signed)
Pt. Concerned because she did not have a reaction to her second COVID 19 vaccine. Concerned it "was maybe expired." Instructed it was a new vaccine and she could always reach out to her PCP. For guidance. Verbalizes understanding.

## 2019-03-29 DIAGNOSIS — Z79899 Other long term (current) drug therapy: Secondary | ICD-10-CM | POA: Diagnosis not present

## 2019-03-29 DIAGNOSIS — Z85828 Personal history of other malignant neoplasm of skin: Secondary | ICD-10-CM | POA: Diagnosis not present

## 2019-03-29 DIAGNOSIS — L309 Dermatitis, unspecified: Secondary | ICD-10-CM | POA: Diagnosis not present

## 2019-03-29 DIAGNOSIS — L298 Other pruritus: Secondary | ICD-10-CM | POA: Diagnosis not present

## 2019-04-14 DIAGNOSIS — L308 Other specified dermatitis: Secondary | ICD-10-CM | POA: Diagnosis not present

## 2019-04-14 DIAGNOSIS — Z85828 Personal history of other malignant neoplasm of skin: Secondary | ICD-10-CM | POA: Diagnosis not present

## 2019-04-23 ENCOUNTER — Other Ambulatory Visit: Payer: Self-pay | Admitting: Internal Medicine

## 2019-04-23 DIAGNOSIS — Z85828 Personal history of other malignant neoplasm of skin: Secondary | ICD-10-CM | POA: Diagnosis not present

## 2019-04-23 DIAGNOSIS — L509 Urticaria, unspecified: Secondary | ICD-10-CM | POA: Diagnosis not present

## 2019-04-27 ENCOUNTER — Other Ambulatory Visit: Payer: Self-pay | Admitting: Internal Medicine

## 2019-04-27 DIAGNOSIS — Z1231 Encounter for screening mammogram for malignant neoplasm of breast: Secondary | ICD-10-CM

## 2019-04-28 DIAGNOSIS — R69 Illness, unspecified: Secondary | ICD-10-CM | POA: Diagnosis not present

## 2019-06-01 ENCOUNTER — Ambulatory Visit: Payer: Medicare HMO | Admitting: Neurology

## 2019-06-28 ENCOUNTER — Other Ambulatory Visit: Payer: Self-pay

## 2019-06-28 ENCOUNTER — Ambulatory Visit
Admission: RE | Admit: 2019-06-28 | Discharge: 2019-06-28 | Disposition: A | Discharge status: Home / Self Care | Payer: Medicare HMO | Source: Ambulatory Visit | Attending: Internal Medicine | Admitting: Internal Medicine

## 2019-06-28 DIAGNOSIS — Z1231 Encounter for screening mammogram for malignant neoplasm of breast: Secondary | ICD-10-CM

## 2019-07-09 ENCOUNTER — Encounter: Payer: Self-pay | Admitting: Neurology

## 2019-07-09 ENCOUNTER — Ambulatory Visit: Payer: Medicare HMO | Admitting: Neurology

## 2019-07-09 ENCOUNTER — Other Ambulatory Visit: Payer: Self-pay

## 2019-07-09 VITALS — BP 155/78 | HR 94 | Ht 64.0 in | Wt 207.0 lb

## 2019-07-09 DIAGNOSIS — E538 Deficiency of other specified B group vitamins: Secondary | ICD-10-CM

## 2019-07-09 DIAGNOSIS — G603 Idiopathic progressive neuropathy: Secondary | ICD-10-CM | POA: Diagnosis not present

## 2019-07-09 MED ORDER — GABAPENTIN 100 MG PO CAPS: 100.0000 mg | ORAL_CAPSULE | Freq: Every day | ORAL | 3 refills | Status: DC

## 2019-07-09 NOTE — Patient Instructions (Signed)
We will start low dose gabapentin at night for the neuropathy pain.  Neurontin (gabapentin) may result in drowsiness, ankle swelling, gait instability, or possibly dizziness. Please contact our office if significant side effects occur with this medication.

## 2019-07-09 NOTE — Progress Notes (Signed)
Reason for visit: Peripheral neuropathy  Referring physician: Dr. Reynaldo Minium  Jillian [name] is a 74 y.o. female  History of present illness:  Jillian [name] is a 74 year old right-handed white female with a history of diabetes over the last 12 years.  She claims that about 3 years prior to her diabetes diagnosis she began having some sensory alteration in the feet.  This has continued over time, but particularly over the last 2 months the symptoms have become much more uncomfortable and noticeable.  The patient reports that she feels fairly good when she is up on her feet walking, but at nighttime when she tries to go to bed and rest her feet begin to tingle and burn.  She feels as if her toes are in a vice.  She is wrapping the feet with a pillow cover to put pressure on the feet to help the sensation.  At times she feels a crawling sensation on the lower leg below the knee.  She has chronic issues with low back pain without associated sciatica.  She also has bilateral hip discomfort and decreased mobility of the hips.  She has difficulty walking because of this.  The patient has noted that she has had increasing issues with constipation and some abdominal discomfort.  She does note some slight gait instability, she has not had any falls.  She has noted some slight numbness in the fingers.  She is to see Dr. Berenice Primas from orthopedic surgery in the near future.  She is sent to this office for further evaluation.  Past Medical History:  Diagnosis Date  . Bowel obstruction ([place])   . Diabetes mellitus without complication ([place])   . Diverticulitis   . Diverticulitis   . GERD (gastroesophageal reflux disease)   . Gout   . High cholesterol   . Hypertension   . Hypothyroidism   . IBS (irritable bowel syndrome)   . Neck pain   . Spondylosis of lumbar spine     Past Surgical History:  Procedure Laterality Date  . ABDOMINAL HYSTERECTOMY    . CARDIAC CATHETERIZATION    . CHOLECYSTECTOMY    .  FRACTURE SURGERY    . HERNIA REPAIR    . TONSILLECTOMY    . WRIST FRACTURE SURGERY Right     History reviewed. No pertinent family history.  Social history:  reports that she has never smoked. She has never used smokeless tobacco. No history on file for alcohol and drug.  Medications:  Prior to Admission medications   Medication Sig Start Date End Date Taking? Authorizing Provider  acetaminophen (TYLENOL) 500 MG tablet Take by mouth.   Yes [provider]  cetirizine (ZYRTEC) 10 MG tablet Take 10 mg by mouth daily as needed for allergies.   Yes [provider]  Cholecalciferol 125 MCG (5000 UT) TABS Take by mouth.   Yes [provider]  cyclobenzaprine (FLEXERIL) 10 MG tablet Take 10 mg by mouth 3 (three) times daily as needed for muscle spasms.   Yes [provider]  Famotidine (PEPCID PO) Take by mouth.   Yes [provider]  ibuprofen (ADVIL,MOTRIN) 200 MG tablet Take 200 mg by mouth 3 (three) times daily as needed for moderate pain.   Yes [provider]  levothyroxine (SYNTHROID) 125 MCG tablet Take by mouth. 12/12/18  Yes [provider]  magnesium gluconate (MAGONATE) 500 MG tablet Take by mouth.   Yes [provider]  metFORMIN (GLUCOPHAGE-XR) 500 MG 24 hr tablet  Take 1,000 mg by mouth 2 (two) times daily.   Yes [provider]  rosuvastatin (CRESTOR) 20 MG tablet Take 20 mg by mouth daily.   Yes [provider]  telmisartan (MICARDIS) 40 MG tablet Take 40 mg by mouth daily.   Yes [provider]      Allergies  Allergen Reactions  . Amoxicillin     Numbness  . Iodinated Diagnostic Agents     rash  . Luminal [Phenobarbital] Other (See Comments)    Blisters  . Mobic [Meloxicam]   . Penicillins     Has patient had a PCN reaction causing immediate rash, facial/tongue/throat swelling, SOB or lightheadedness with hypotension:Y Has patient had a PCN reaction causing severe rash  involving mucus membranes or skin necrosis: N Has patient had a PCN reaction that required hospitalization: N Has patient had a PCN reaction occurring within the last 10 years: N If all of the above answers are "NO", then may proceed with Cephalosporin use.   . Allopurinol Rash  . Ciprofloxacin Rash  . Latex Rash    ROS:  Out of a complete 14 system review of symptoms, the patient complains only of the following symptoms, and all other reviewed systems are negative.  Foot discomfort Chronic back pain, hip pain Walking difficulty Weight gain Swelling in the legs Skin rash, itching Easy bruising Feeling cold Joint pain, aching muscles Allergies, runny nose, skin sensitivity Memory loss, headache, numbness Depression, anxiety, decreased energy Daytime sleepiness  Blood pressure (!) 155/78, pulse 94, height 5\' 4"  (1.626 m), weight 207 lb (93.9 kg).  Physical Exam  General: The patient is alert and cooperative at the time of the examination.  The patient is moderately to markedly obese.  Eyes: Pupils are equal, round, and reactive to light. Discs are flat bilaterally.  Neck: The neck is supple, no carotid bruits are noted.  Respiratory: The respiratory examination is clear.  Cardiovascular: The cardiovascular examination reveals a regular rate and rhythm, no obvious murmurs or rubs are noted.  Skin: Extremities are without significant edema.  Neurologic Exam  Mental status: The patient is alert and oriented x 3 at the time of the examination. The patient has apparent normal recent and remote memory, with an apparently normal attention span and concentration ability.  Cranial nerves: Facial symmetry is present. There is good sensation of the face to pinprick and soft touch bilaterally. The strength of the facial muscles and the muscles to head turning and shoulder shrug are normal bilaterally. Speech is well enunciated, no aphasia or dysarthria is noted. Extraocular  movements are full. Visual fields are full. The tongue is midline, and the patient has symmetric elevation of the soft palate. No obvious hearing deficits are noted.  Motor: The motor testing reveals 5 over 5 strength of all 4 extremities. Good symmetric motor tone is noted throughout.  Sensory: Sensory testing is intact to pinprick, soft touch, vibration sensation, and position sense on the upper extremities.  With the lower extremities there is a stocking pattern pinprick sensory deficit across the ankles bilaterally, vibration sensation and position sensation are intact bilaterally.  No evidence of extinction is noted.  Coordination: Cerebellar testing reveals good finger-nose-finger bilaterally.  The patient is not able to perform heel-to-shin due to decreased mobility and range of movement of the hips.  Gait and station: Gait is normal. Tandem gait is unsteady. Romberg is negative. No drift is seen.  The patient is able to walk on the heels and the toes  bilaterally.  Reflexes: Deep tendon reflexes are symmetric and normal bilaterally.  The ankle jerk reflexes are well maintained bilaterally.  Toes are downgoing bilaterally.   Assessment/Plan:  1.  Bilateral foot discomfort, possible diabetic neuropathy  2.  Chronic low back pain  3.  Decreased hip mobility bilaterally  The patient has well-maintained reflexes throughout, if she does have a neuropathy it is very mild in nature.  She has had symptoms however for 15 years.  We will add low-dose gabapentin at night, taking 100 mg.  She will be set up for blood work today, she will have nerve conduction studies on both legs and EMG on 1 leg.  She will follow-up for the EMG and follow-up for revisit sometime in the next 4 months.  Jill Alexanders MD 07/09/2019 9:59 AM  [facility] [address] [place], [place] [address]  Phone [phone] Fax [phone]

## 2019-07-14 LAB — ANGIOTENSIN CONVERTING ENZYME: Angio Convert Enzyme: 30 U/L (ref 14–82)

## 2019-07-14 LAB — MULTIPLE MYELOMA PANEL, SERUM
Albumin SerPl Elph-Mcnc: 3.9 g/dL (ref 2.9–4.4)
Albumin/Glob SerPl: 1.1 (ref 0.7–1.7)
Alpha 1: 0.2 g/dL (ref 0.0–0.4)
Alpha2 Glob SerPl Elph-Mcnc: 1 g/dL (ref 0.4–1.0)
B-Globulin SerPl Elph-Mcnc: 1.2 g/dL (ref 0.7–1.3)
Gamma Glob SerPl Elph-Mcnc: 1.2 g/dL (ref 0.4–1.8)
Globulin, Total: 3.6 g/dL (ref 2.2–3.9)
IgA/Immunoglobulin A, Serum: 195 mg/dL (ref 64–422)
IgG (Immunoglobin G), Serum: 1244 mg/dL (ref 586–1602)
IgM (Immunoglobulin M), Srm: 44 mg/dL (ref 26–217)
Total Protein: 7.5 g/dL (ref 6.0–8.5)

## 2019-07-14 LAB — VITAMIN B12: Vitamin B-12: 807 pg/mL (ref 232–1245)

## 2019-07-14 LAB — ANA W/REFLEX: Anti Nuclear Antibody (ANA): NEGATIVE

## 2019-07-14 LAB — SEDIMENTATION RATE: Sed Rate: 11 mm/hr (ref 0–40)

## 2019-07-14 LAB — B. BURGDORFI ANTIBODIES: Lyme IgG/IgM Ab: <0.91 {ISR} (ref 0.00–0.90)

## 2019-07-16 DIAGNOSIS — R197 Diarrhea, unspecified: Secondary | ICD-10-CM | POA: Diagnosis not present

## 2019-07-16 DIAGNOSIS — K589 Irritable bowel syndrome without diarrhea: Secondary | ICD-10-CM | POA: Diagnosis not present

## 2019-07-16 DIAGNOSIS — R1084 Generalized abdominal pain: Secondary | ICD-10-CM | POA: Diagnosis not present

## 2019-07-19 ENCOUNTER — Encounter: Payer: Self-pay | Admitting: Internal Medicine

## 2019-07-20 ENCOUNTER — Telehealth: Payer: Self-pay

## 2019-07-20 NOTE — Telephone Encounter (Signed)
-----   Message from Kathrynn Ducking, MD sent at 07/15/2019  4:16 PM EDT -----  The blood work results are unremarkable. Please call the patient. ----- Message ----- From: Lavone Neri Lab Results In Sent: 07/10/2019   7:38 AM EDT To: Kathrynn Ducking, MD

## 2019-07-20 NOTE — Telephone Encounter (Signed)
I called pt that the lab work results are unremarkable. Pt verbalized understanding

## 2019-07-28 DIAGNOSIS — E7849 Other hyperlipidemia: Secondary | ICD-10-CM | POA: Diagnosis not present

## 2019-07-28 DIAGNOSIS — E038 Other specified hypothyroidism: Secondary | ICD-10-CM | POA: Diagnosis not present

## 2019-07-28 DIAGNOSIS — E1169 Type 2 diabetes mellitus with other specified complication: Secondary | ICD-10-CM | POA: Diagnosis not present

## 2019-07-28 DIAGNOSIS — M109 Gout, unspecified: Secondary | ICD-10-CM | POA: Diagnosis not present

## 2019-07-28 DIAGNOSIS — Z0000 Encounter for general adult medical examination without abnormal findings: Secondary | ICD-10-CM | POA: Diagnosis not present

## 2019-07-29 DIAGNOSIS — M5442 Lumbago with sciatica, left side: Secondary | ICD-10-CM | POA: Diagnosis not present

## 2019-07-29 DIAGNOSIS — M545 Low back pain: Secondary | ICD-10-CM | POA: Diagnosis not present

## 2019-07-29 DIAGNOSIS — M5441 Lumbago with sciatica, right side: Secondary | ICD-10-CM | POA: Diagnosis not present

## 2019-08-04 DIAGNOSIS — K589 Irritable bowel syndrome without diarrhea: Secondary | ICD-10-CM | POA: Diagnosis not present

## 2019-08-04 DIAGNOSIS — E039 Hypothyroidism, unspecified: Secondary | ICD-10-CM | POA: Diagnosis not present

## 2019-08-04 DIAGNOSIS — E785 Hyperlipidemia, unspecified: Secondary | ICD-10-CM | POA: Diagnosis not present

## 2019-08-04 DIAGNOSIS — M47816 Spondylosis without myelopathy or radiculopathy, lumbar region: Secondary | ICD-10-CM | POA: Diagnosis not present

## 2019-08-04 DIAGNOSIS — E669 Obesity, unspecified: Secondary | ICD-10-CM | POA: Diagnosis not present

## 2019-08-04 DIAGNOSIS — I10 Essential (primary) hypertension: Secondary | ICD-10-CM | POA: Diagnosis not present

## 2019-08-04 DIAGNOSIS — E1140 Type 2 diabetes mellitus with diabetic neuropathy, unspecified: Secondary | ICD-10-CM | POA: Diagnosis not present

## 2019-08-04 DIAGNOSIS — Z1339 Encounter for screening examination for other mental health and behavioral disorders: Secondary | ICD-10-CM | POA: Diagnosis not present

## 2019-08-04 DIAGNOSIS — Z0000 Encounter for general adult medical examination without abnormal findings: Secondary | ICD-10-CM | POA: Diagnosis not present

## 2019-08-04 DIAGNOSIS — Z1331 Encounter for screening for depression: Secondary | ICD-10-CM | POA: Diagnosis not present

## 2019-08-04 DIAGNOSIS — M109 Gout, unspecified: Secondary | ICD-10-CM | POA: Diagnosis not present

## 2019-08-04 DIAGNOSIS — R69 Illness, unspecified: Secondary | ICD-10-CM | POA: Diagnosis not present

## 2019-08-04 DIAGNOSIS — E1149 Type 2 diabetes mellitus with other diabetic neurological complication: Secondary | ICD-10-CM | POA: Diagnosis not present

## 2019-08-05 DIAGNOSIS — R69 Illness, unspecified: Secondary | ICD-10-CM | POA: Diagnosis not present

## 2019-08-16 ENCOUNTER — Encounter: Payer: Medicare HMO | Admitting: Neurology

## 2019-08-26 DIAGNOSIS — R300 Dysuria: Secondary | ICD-10-CM | POA: Diagnosis not present

## 2019-08-26 DIAGNOSIS — N390 Urinary tract infection, site not specified: Secondary | ICD-10-CM | POA: Diagnosis not present

## 2019-09-02 DIAGNOSIS — M5442 Lumbago with sciatica, left side: Secondary | ICD-10-CM | POA: Diagnosis not present

## 2019-09-02 DIAGNOSIS — M545 Low back pain: Secondary | ICD-10-CM | POA: Diagnosis not present

## 2019-09-02 DIAGNOSIS — M5441 Lumbago with sciatica, right side: Secondary | ICD-10-CM | POA: Diagnosis not present

## 2019-09-02 DIAGNOSIS — M25551 Pain in right hip: Secondary | ICD-10-CM | POA: Diagnosis not present

## 2019-09-02 DIAGNOSIS — M25552 Pain in left hip: Secondary | ICD-10-CM | POA: Diagnosis not present

## 2019-09-13 ENCOUNTER — Other Ambulatory Visit: Payer: Self-pay

## 2019-09-13 ENCOUNTER — Ambulatory Visit (INDEPENDENT_AMBULATORY_CARE_PROVIDER_SITE_OTHER): Payer: Medicare HMO | Admitting: Neurology

## 2019-09-13 ENCOUNTER — Encounter: Payer: Self-pay | Admitting: Neurology

## 2019-09-13 ENCOUNTER — Ambulatory Visit: Payer: Medicare HMO | Admitting: Neurology

## 2019-09-13 DIAGNOSIS — M79672 Pain in left foot: Secondary | ICD-10-CM

## 2019-09-13 DIAGNOSIS — G603 Idiopathic progressive neuropathy: Secondary | ICD-10-CM | POA: Diagnosis not present

## 2019-09-13 DIAGNOSIS — M79671 Pain in right foot: Secondary | ICD-10-CM | POA: Insufficient documentation

## 2019-09-13 NOTE — Progress Notes (Signed)
Please refer to EMG and nerve conduction procedure note.  

## 2019-09-13 NOTE — Progress Notes (Signed)
[facility]    Nerve / Sites Muscle Latency Ref. Amplitude Ref. Rel Amp Segments Distance Velocity Ref. Area    ms ms mV mV %  cm m/s m/s mVms  R Peroneal - EDB     Ankle EDB 4.9 ?6.5 6.0 ?2.0 100 Ankle - EDB 9   17.3     Fib head EDB 11.0  5.0  82.7 Fib head - Ankle 28 46 ?44 15.8     Pop fossa EDB 13.2  4.8  97 Pop fossa - Fib head 10 45 ?44 15.4         Pop fossa - Ankle      R Tibial - AH     Ankle AH 4.8 ?5.8 7.5 ?4.0 100 Ankle - AH 9   20.5     Pop fossa AH 13.2  5.6  74.4 Pop fossa - Ankle 37 44 ?41 19.8         SNC    Nerve / Sites Rec. Site Peak Lat Ref.  Amp Ref. Segments Distance    ms ms V V  cm  R Sural - Ankle (Calf)     Calf Ankle 3.5 ?4.4 21 ?6 Calf - Ankle 14  R Superficial peroneal - Ankle     Lat leg Ankle 3.5 ?4.4 6 ?6 Lat leg - Ankle 14         F  Wave    Nerve F Lat Ref.   ms ms  R Tibial - AH 55.5 ?56.0

## 2019-09-13 NOTE — Procedures (Signed)
     HISTORY:  Jillian [name] is a 74 year old patient with a history of chronic back pain who reports some discomfort in the toes and feet in the evenings.  The patient does have a history of diabetes, she is being evaluated for a possible neuropathy or a radiculopathy.  NERVE CONDUCTION STUDIES:  Nerve conduction studies were performed on the right lower extremity. The distal motor latencies and motor amplitudes for the peroneal and posterior tibial nerves were within normal limits. The nerve conduction velocities for these nerves were also normal. The sensory latencies for the peroneal and sural nerves were within normal limits. The F wave latency for the posterior tibial nerve was within normal limits.  The patient did not wish to have the left leg evaluated.  EMG STUDIES:  EMG study was performed on the right lower extremity:  The tibialis anterior muscle reveals 2 to 4K motor units with full recruitment. No fibrillations or positive waves were seen. The peroneus tertius muscle reveals 2 to 4K motor units with full recruitment. No fibrillations or positive waves were seen. The medial gastrocnemius muscle reveals 1 to 3K motor units with full recruitment. No fibrillations or positive waves were seen. The vastus lateralis muscle reveals 2 to 4K motor units with full recruitment. No fibrillations or positive waves were seen. The iliopsoas muscle reveals 2 to 4K motor units with full recruitment. No fibrillations or positive waves were seen. The biceps femoris muscle (long head) reveals 2 to 4K motor units with full recruitment. No fibrillations or positive waves were seen. The lumbosacral paraspinal muscles were tested at 3 levels, and revealed no abnormalities of insertional activity at all 3 levels tested. There was good relaxation.   IMPRESSION:  Nerve conduction studies done on the right lower extremity were unremarkable, no evidence of a neuropathy was seen.  A small fiber neuropathy  cannot be excluded by this study.  EMG evaluation of the right lower extremity was unremarkable, there is no evidence of an overlying lumbosacral radiculopathy.  Jill Alexanders MD 09/13/2019 3:38 PM  [facility] [address] [place], [place] [address]  Phone [phone] Fax [phone]

## 2019-09-21 DIAGNOSIS — M791 Myalgia, unspecified site: Secondary | ICD-10-CM | POA: Diagnosis not present

## 2019-09-21 DIAGNOSIS — M47816 Spondylosis without myelopathy or radiculopathy, lumbar region: Secondary | ICD-10-CM | POA: Diagnosis not present

## 2019-10-12 ENCOUNTER — Telehealth: Payer: Self-pay

## 2019-10-12 NOTE — Telephone Encounter (Signed)
Patient called to get a new patient appointment regarding the Covid component testing. Patient states she got both of her vaccines in January 2021. Patient received her 2nd vaccine and broke out in hives all over her body. Patient seen her dermatologist and was prescribed Prednisone 3 weeks after developing hives.  The hives did disappear after the prednisone was taken. Patient is worried about taking the booster and having the same reaction or worse.  Please Advise.

## 2019-10-12 NOTE — Telephone Encounter (Signed)
Patient received her 1 vaccine in January 11 th patient received her 2nd vaccine late January 30 th 2021, 3 weeks after and developed hives several days  following her injection occurring on bilateral arms and legs ONLY. She was treated with steroid creams and benadryl for 3 weeks and didn't seemed to help. Patient was then followed up by the dermatologist 3x with in the weeks of treatments and was then prescribed prednisone after demanding them to provide her with steroids. She is allergic Phenobarbital, Cipro, Allopurinol, Latex, Amoxicillin, as well as environmental allergies as well. She has not had any allergic reactions to any other immunizations such shingles, pneumococcal, as well as the Flu vaccine. No issues with PEG in the past, but has issue taking Miralax in the past and claims she felt like she was feeling like she was passing sand through her body but did not have an allergic reaction.  Had never use derma fillers, so patient stated she is highly nervous and is wanting to know what steps she needs to take. Please advise on this

## 2019-10-15 NOTE — Telephone Encounter (Signed)
I am not certain that testing is going to help, but it might reassure her regarding future boosters. Go ahead and schedule it.   Salvatore Marvel, MD [facility] [place]

## 2019-10-15 NOTE — Telephone Encounter (Signed)
Dee,   Can you schedule this patient please?

## 2019-10-18 DIAGNOSIS — L986 Other infiltrative disorders of the skin and subcutaneous tissue: Secondary | ICD-10-CM | POA: Diagnosis not present

## 2019-10-18 DIAGNOSIS — L820 Inflamed seborrheic keratosis: Secondary | ICD-10-CM | POA: Diagnosis not present

## 2019-10-18 DIAGNOSIS — D2262 Melanocytic nevi of left upper limb, including shoulder: Secondary | ICD-10-CM | POA: Diagnosis not present

## 2019-10-18 DIAGNOSIS — L538 Other specified erythematous conditions: Secondary | ICD-10-CM | POA: Diagnosis not present

## 2019-10-18 DIAGNOSIS — L308 Other specified dermatitis: Secondary | ICD-10-CM | POA: Diagnosis not present

## 2019-10-18 DIAGNOSIS — D2271 Melanocytic nevi of right lower limb, including hip: Secondary | ICD-10-CM | POA: Diagnosis not present

## 2019-10-18 DIAGNOSIS — D225 Melanocytic nevi of trunk: Secondary | ICD-10-CM | POA: Diagnosis not present

## 2019-10-18 DIAGNOSIS — L309 Dermatitis, unspecified: Secondary | ICD-10-CM | POA: Diagnosis not present

## 2019-10-18 DIAGNOSIS — D2272 Melanocytic nevi of left lower limb, including hip: Secondary | ICD-10-CM | POA: Diagnosis not present

## 2019-10-18 DIAGNOSIS — Z85828 Personal history of other malignant neoplasm of skin: Secondary | ICD-10-CM | POA: Diagnosis not present

## 2019-10-18 NOTE — Telephone Encounter (Signed)
Patient is scheduled for 11/23/2019 @ 1:30 with Gareth Morgan. Patient had more questions so I placed her on the phone with Da'Shaunia.  Thanks

## 2019-10-18 NOTE — Telephone Encounter (Signed)
Patient had concerns of an anaphylaxis reaction and I expressed to patient that we are prepared for any kind of reaction that she may have and I stated that the doctor will come in and talk to her before any testing is done. Patient expressed understanding and felt better about coming in for appointment.

## 2019-10-19 NOTE — Telephone Encounter (Signed)
She sounds like she could be a handful. Webb Silversmith - should we try to schedule this on a Friday with Dr. Nelva Bush instead? Is Dr. Verlin Fester going to go for this?   Salvatore Marvel, MD [facility] [place]

## 2019-10-28 DIAGNOSIS — M47816 Spondylosis without myelopathy or radiculopathy, lumbar region: Secondary | ICD-10-CM | POA: Diagnosis not present

## 2019-11-02 DIAGNOSIS — R69 Illness, unspecified: Secondary | ICD-10-CM | POA: Diagnosis not present

## 2019-11-05 NOTE — Telephone Encounter (Signed)
Left voicemail to reschedule COVID component testing due to being scheduled too late on a Friday. Patient can go to [facility] for testing on 9/17 or 9/20 if interested.

## 2019-11-09 ENCOUNTER — Ambulatory Visit: Payer: Medicare HMO | Admitting: Neurology

## 2019-11-11 ENCOUNTER — Encounter: Payer: Self-pay | Admitting: Physician Assistant

## 2019-11-12 ENCOUNTER — Encounter: Payer: Medicare HMO | Admitting: Allergy

## 2019-11-15 ENCOUNTER — Ambulatory Visit (INDEPENDENT_AMBULATORY_CARE_PROVIDER_SITE_OTHER): Payer: Medicare HMO | Admitting: Allergy & Immunology

## 2019-11-15 ENCOUNTER — Other Ambulatory Visit: Payer: Self-pay

## 2019-11-15 ENCOUNTER — Encounter: Payer: Self-pay | Admitting: Allergy & Immunology

## 2019-11-15 VITALS — BP 140/80 | HR 93 | Resp 17 | Ht 64.0 in | Wt 214.5 lb

## 2019-11-15 DIAGNOSIS — Z888 Allergy status to other drugs, medicaments and biological substances status: Secondary | ICD-10-CM

## 2019-11-15 DIAGNOSIS — T50Z95D Adverse effect of other vaccines and biological substances, subsequent encounter: Secondary | ICD-10-CM

## 2019-11-15 DIAGNOSIS — T50B95D Adverse effect of other viral vaccines, subsequent encounter: Secondary | ICD-10-CM | POA: Diagnosis not present

## 2019-11-15 NOTE — Progress Notes (Signed)
NEW PATIENT  Date of Service/Encounter:  11/15/19  Referring provider: Burnard Bunting, MD   Assessment:   Vaccines and biological substances causing adverse effect in therapeutic use  Plan/Recommendations:   1. Concern for COVID vaccine reaction - Testing today was negative, including the challenge to the Miralax (PEG). - I think that you will tolerate the COVID vaccine well. - There is currently no definite skin testing available for the Pfizer and Moderna COVID vaccines and data is limited however, there has been a concern regarding sensitivity to PEG in patients who had anaphylactic reactions to the COVID-19 vaccine. - Skin testing was negative to Miralax (source of PEG 3350), methylprednisolone acetate (source of PEG 3350), and triamcinolone acetonide (source of polysorbate-80). - Let us know how you do when you decide to get the vaccine and how you do with it. - I would prefer to avoid steroids for a week or so after the vaccine.   2. Return if symptoms worsen or fail to improve.    Subjective:   Jillian [name] is a 74 y.o. female presenting today for evaluation of  Chief Complaint  Patient presents with   Allergy Testing    Covid Component Testing    Jillian [name] has a history of the following: Patient Active Problem List   Diagnosis Date Noted   Foot pain, bilateral 09/13/2019    History obtained from: chart review and patient and her husband.  Jillian [name] was referred by Burnard Bunting, MD.     Jillian [name] is a 74 y.o. female presenting for an evaluation of COVID19 vaccine anaphylaxis.  She received her first vaccine January 11 and her second vaccine on January 30.  She developed hives several days later after her second vaccine on her arms and legs only.  She was treated with steroid cream and Benadryl for 3 weeks which did not seem to help.  She was then placed on steroids after there was no improvement.  She went to see her PCP a  few times and this was driving her crazy. She was up all night scratching and got tired after three weeks. She was finally given a course of prednisone with clearance of the rash. She describes urticaria. They did not leave permanent skin changes on the arms or legs.   She did have hives in the past around 48 years ago when she with her daughter. She was given phenobarbital to prevent any seizures for preeclampsia. She developed welts from the first dose. She was on prednisone for nearly two weeks to clear this up.  She has never had any allergic reactions other immunizations including shingles, Pneumovax, or influenza.  She has no problems with polyethylene glycol in general, but reports that when using MiraLAX she feels like she was "passing sand through [her] body".    She has a history of chronic back pain. She did have a problem with a Toradol shot and developed dizziness and chest heaviness from this injection. She has not had Toradol in quite some time.  She was on shots for 3-4 years when she was younger. She has noticed that since she has moved into her new condo, she has had worsening symptoms.  She reports that there is white fluff that tends to collect on the furniture.  They have had two different companies come in to clean air ducts.  Otherwise, there is no history of other atopic diseases, including asthma, stinging insect allergies, eczema, urticaria or contact dermatitis. There is  no significant infectious history. Vaccinations are up to date.    Past Medical History: Patient Active Problem List   Diagnosis Date Noted   Foot pain, bilateral 09/13/2019    Medication List:  Allergies as of 11/15/2019      Reactions   Amoxicillin    Numbness   Iodinated Diagnostic Agents    rash   Luminal [phenobarbital] Other (See Comments)   Blisters   Mobic [meloxicam]    Penicillins    Has patient had a PCN reaction causing immediate rash, facial/tongue/throat swelling, SOB or  lightheadedness with hypotension:Y Has patient had a PCN reaction causing severe rash involving mucus membranes or skin necrosis: N Has patient had a PCN reaction that required hospitalization: N Has patient had a PCN reaction occurring within the last 10 years: N If all of the above answers are "NO", then may proceed with Cephalosporin use.   Allopurinol Rash   Ciprofloxacin Rash   Latex Rash      Medication List       Accurate as of November 15, 2019 11:59 PM. If you have any questions, ask your nurse or doctor.        STOP taking these medications   acetaminophen 500 MG tablet Commonly known as: TYLENOL Stopped by: Valentina Shaggy, MD   cetirizine 10 MG tablet Commonly known as: ZYRTEC Stopped by: Valentina Shaggy, MD   gabapentin 100 MG capsule Commonly known as: Neurontin Stopped by: Valentina Shaggy, MD     TAKE these medications   Cholecalciferol 125 MCG (5000 UT) Tabs Take by mouth.   cyclobenzaprine 10 MG tablet Commonly known as: FLEXERIL Take 10 mg by mouth 3 (three) times daily as needed for muscle spasms.   ibuprofen 200 MG tablet Commonly known as: ADVIL Take 200 mg by mouth 3 (three) times daily as needed for moderate pain.   levothyroxine 137 MCG tablet Commonly known as: SYNTHROID Take 137 mcg by mouth daily. What changed: Another medication with the same name was removed. Continue taking this medication, and follow the directions you see here. Changed by: Valentina Shaggy, MD   magnesium gluconate 500 MG tablet Commonly known as: MAGONATE Take by mouth.   metFORMIN 500 MG 24 hr tablet Commonly known as: GLUCOPHAGE-XR Take 1,000 mg by mouth 2 (two) times daily.   PEPCID PO Take by mouth.   rosuvastatin 20 MG tablet Commonly known as: CRESTOR Take 20 mg by mouth daily.   telmisartan 40 MG tablet Commonly known as: MICARDIS Take 40 mg by mouth daily.       Birth History: non-contributory  Developmental History:  non-contributory  Past Surgical History: Past Surgical History:  Procedure Laterality Date   ABDOMINAL HYSTERECTOMY     CARDIAC CATHETERIZATION     CHOLECYSTECTOMY     FRACTURE SURGERY     HERNIA REPAIR     TONSILLECTOMY     WRIST FRACTURE SURGERY Right      Family History: History reviewed. No pertinent family history.   Social History: Marnita lives at home with her husband.  They live in a townhome that is 72 years old.  There is carpeting throughout the home.  They have gas heating and central cooling.  There are no animals inside or outside of the home.  She does not have dust mite covers on the bedding.  She is retired.  [place]  There is no fume, chemical, or dust exposure.  She does  not have a HEPA filter in the home.  There is no tobacco exposure.   Review of Systems  Constitutional: Negative.  Negative for chills, fever, malaise/fatigue and weight loss.  HENT: Negative.  Negative for congestion, ear discharge and ear pain.   Eyes: Negative for pain, discharge and redness.  Respiratory: Negative for cough, sputum production, shortness of breath and wheezing.   Cardiovascular: Negative.  Negative for chest pain and palpitations.  Gastrointestinal: Negative for abdominal pain, constipation, diarrhea, heartburn, nausea and vomiting.  Skin: Positive for itching and rash.  Neurological: Negative for dizziness and headaches.  Endo/Heme/Allergies: Negative for environmental allergies. Does not bruise/bleed easily.       Objective:   Blood pressure 140/80, pulse 93, resp. rate 17, height 5\' 4"  (1.626 m), weight 214 lb 8 oz (97.3 kg), SpO2 97 %. Body mass index is 36.82 kg/m.   Physical Exam:   Physical Exam Constitutional:      Appearance: She is well-developed.  HENT:     Head: Normocephalic and atraumatic.     Right Ear: Tympanic membrane, ear canal and external ear normal. No drainage, swelling or tenderness. Tympanic  membrane is not injected, scarred, erythematous, retracted or bulging.     Left Ear: Tympanic membrane, ear canal and external ear normal. No drainage, swelling or tenderness. Tympanic membrane is not injected, scarred, erythematous, retracted or bulging.     Nose: No nasal deformity, septal deviation, mucosal edema or rhinorrhea.     Right Sinus: No maxillary sinus tenderness or frontal sinus tenderness.     Left Sinus: No maxillary sinus tenderness or frontal sinus tenderness.     Mouth/Throat:     Mouth: Mucous membranes are not pale and not dry.     Pharynx: Uvula midline.  Eyes:     General:        Right eye: No discharge.        Left eye: No discharge.     Conjunctiva/sclera: Conjunctivae normal.     Right eye: Right conjunctiva is not injected. No chemosis.    Left eye: Left conjunctiva is not injected. No chemosis.    Pupils: Pupils are equal, round, and reactive to light.  Cardiovascular:     Rate and Rhythm: Normal rate and regular rhythm.     Heart sounds: Normal heart sounds.  Pulmonary:     Effort: Pulmonary effort is normal. No tachypnea, accessory muscle usage or respiratory distress.     Breath sounds: Normal breath sounds. No wheezing, rhonchi or rales.  Chest:     Chest wall: No tenderness.  Abdominal:     Tenderness: There is no abdominal tenderness. There is no guarding or rebound.  Lymphadenopathy:     Head:     Right side of head: No submandibular, tonsillar or occipital adenopathy.     Left side of head: No submandibular, tonsillar or occipital adenopathy.     Cervical: No cervical adenopathy.  Skin:    Coloration: Skin is not pale.     Findings: No abrasion, erythema, petechiae or rash. Rash is not papular, urticarial or vesicular.  Neurological:     Mental Status: She is alert.      Diagnostic studies:   Allergy Studies:     COVID Vaccine Testing - 11/15/19 1504      Test Information   Consent Yes    Medications Miralax    Triamcinolone Lot #  [id number]    Triamcinolone EXP DATE 12/26/20    Methylprednisolone Lot #  [id number]    Methylprednisolone EXP DATE 04/25/20    Miralax Lot # [id number]    Miralax EXP DATE 06/25/20      Pre Test Vitals   BP 140/80    Pulse 93    Resp 17      SKIN PRICK TESTING - Arm #1   Location Left Arm    Select Select      HISTAMINE (1mg /mL) Skin Prick Arm #1   Histamine Time Testing Placed 1300    Histamine Wheal Negative      Control (negative - HSA) Skin Prick Arm #1   Control Time Testing Placed 1300    Control Wheal 2+      Triamcinolone (40mg /mL) Skin Prick Arm #1   Triamcinolone Time Testing Placed 1300    Triamcinolone Wheal Negative      Methylprednisolone (40mg /mL) Skin Prick Arm #1   Methylprednisolone Time Testing Placed 1300    Methylprednisolone Wheal Negative      Miralax (1:100 or 1.7 mg/mL) Skin Prick Arm #1   Miralax Time Testing Placed 1300    Miralax Wheal Negative      Miralax (1:10 or 17mg /mL) Skin Prick Arm #1   Miralax Time Testing Placed 1327    Miralax Wheal Negative      Miralax (1:1 or 170mg /mL) Skin Prick Arm #1   Miralax Time Testing Placed 1348    Miralax Wheal Negative      INTRADERMAL TESTING - Arm #2   Location Right Arm    Select Select      Control (negative - HSA) Intradermal Arm #2   Control Time Testing Placed  1327    Control Wheal Negative      Triamcinolone (1:100) Intradermal Arm #2   Triamcinolone Time Testing Placed 1327    Triamcinolone Wheal Negative      Methylprednisolone (1:100) Intradermal Arm #2   Methylprednisolone Time Testing Placed  1327    Methylprednisolone Wheal Negative      Triamcinolone (1:10) Intradermal Arm #2   Triamcinolone Time Testing Placed 1348    Triamcinolone Wheal Negative      Methylprednisolone (1:10) Intradermal Arm #2   Methylprednisolone Time Testing Placed  1348    Methylprednisolone Wheal Negative      Triamcinolone (1:1) Intradermal Arm #2   Triamcinolone Time Testing Placed 1407     Triamcinolone Wheal Negative      Skin Prick/Intradermal Post Testing   Skin Prick/Intradermal Testing Total Pricks 13      ORAL CHALLENGE TESTING   Select Select      Miralax 170mg /mL Suspension Oral Challenge 0.3 mL   Miralax 0.3 mL Time Given 1425      Miralax 170mg /mL Suspension Oral Challenge 3 mL   Miralax 3 mL Time Given 1443      Miralax 170mg /mL Suspension Oral Challenge 15 mL   Miralax 15 mL Time Given 1500           Allergy testing results were read and interpreted by myself, documented by clinical staff.         Salvatore Marvel, MD [facility] and [facility] [place]

## 2019-11-15 NOTE — Patient Instructions (Signed)
1. Concern for COVID vaccine reaction - Testing today was negative, including the challenge to the Miralax (PEG). - I think that you will tolerate the COVID vaccine well. - There is currently no definite skin testing available for the Pfizer and Moderna COVID vaccines and data is limited however, there has been a concern regarding sensitivity to PEG in patients who had anaphylactic reactions to the COVID-19 vaccine. - Skin testing was negative to Miralax (source of PEG 3350), methylprednisolone acetate (source of PEG 3350), and triamcinolone acetonide (source of polysorbate-80). - Let us know how you do when you decide to get the vaccine and how you do with it. - I would prefer to avoid steroids for a week or so after the vaccine.   2. Return if symptoms worsen or fail to improve.    Please inform us of any Emergency Department visits, hospitalizations, or changes in symptoms. Call us before going to the ED for breathing or allergy symptoms since we might be able to fit you in for a sick visit. Feel free to contact us anytime with any questions, problems, or concerns.  It was a pleasure to meet you today!  Websites that have reliable patient information: 1. American Academy of Asthma, Allergy, and Immunology: www.aaaai.org 2. Food Allergy Research and Education (FARE): foodallergy.org 3. Mothers of Asthmatics: http://www.asthmacommunitynetwork.org 4. American College of Allergy, Asthma, and Immunology: www.acaai.org   COVID-19 Vaccine Information can be found at: ShippingScam.co.uk For questions related to vaccine distribution or appointments, please email [email or web address] or call [phone].     "Like" [place] on Facebook and Instagram for our latest updates!        Make sure you are registered to vote! If you have moved or changed any of your contact information, you will need to get this updated before voting!  In some  cases, you MAY be able to register to vote online: [email or web address]

## 2019-11-16 ENCOUNTER — Encounter: Payer: Self-pay | Admitting: Allergy & Immunology

## 2019-11-22 ENCOUNTER — Other Ambulatory Visit: Payer: Self-pay | Admitting: Physical Medicine and Rehabilitation

## 2019-11-22 DIAGNOSIS — M545 Low back pain, unspecified: Secondary | ICD-10-CM

## 2019-11-23 ENCOUNTER — Encounter: Payer: Medicare HMO | Admitting: Family Medicine

## 2019-11-24 ENCOUNTER — Telehealth: Payer: Self-pay | Admitting: Allergy & Immunology

## 2019-11-24 ENCOUNTER — Other Ambulatory Visit: Payer: Medicare HMO

## 2019-11-24 NOTE — Telephone Encounter (Signed)
Patient had her Covid shots and said you offered her a booster and she said she needed to think about it. She has some questions she needs to ask you before deciding and she only wants to talk to you. I did tell her you were out this week, so it might be next week before you contacted her. She said that was fine.

## 2019-11-27 ENCOUNTER — Ambulatory Visit
Admission: RE | Admit: 2019-11-27 | Discharge: 2019-11-27 | Disposition: A | Discharge status: Home / Self Care | Payer: Medicare HMO | Source: Ambulatory Visit | Attending: Physical Medicine and Rehabilitation | Admitting: Physical Medicine and Rehabilitation

## 2019-11-27 ENCOUNTER — Other Ambulatory Visit: Payer: Self-pay

## 2019-11-27 DIAGNOSIS — M4186 Other forms of scoliosis, lumbar region: Secondary | ICD-10-CM | POA: Diagnosis not present

## 2019-11-27 DIAGNOSIS — M545 Low back pain, unspecified: Secondary | ICD-10-CM

## 2019-11-29 DIAGNOSIS — M47816 Spondylosis without myelopathy or radiculopathy, lumbar region: Secondary | ICD-10-CM | POA: Diagnosis not present

## 2019-12-01 DIAGNOSIS — L578 Other skin changes due to chronic exposure to nonionizing radiation: Secondary | ICD-10-CM | POA: Diagnosis not present

## 2019-12-01 DIAGNOSIS — L570 Actinic keratosis: Secondary | ICD-10-CM | POA: Diagnosis not present

## 2019-12-01 DIAGNOSIS — D485 Neoplasm of uncertain behavior of skin: Secondary | ICD-10-CM | POA: Diagnosis not present

## 2019-12-01 NOTE — Telephone Encounter (Signed)
Pt states that she had the covid component test done a few weeks ago. Pt states that she had a sever reaction from the second vaccine. She broke out into hives 2 days after getting the component testing. Pt wants to know if she gets the booster on Oct 11th will she go into shock? She wanted to know if she needs her epi pen as well when she receives the booster? She is really trying to take her time about getting it. Pt had hives on arms and legs for 3 weeks. Dermatology wouldn't give her prednisone because they thought it would interfere with the vaccine. I advised pt that your schedule is full for today but I would relay the message. She is ok with that.

## 2019-12-01 NOTE — Telephone Encounter (Signed)
I have a very full schedule today.  Can a nurse call and get some details about what her questions are about the COVID-19 vaccine?  Salvatore Marvel, MD [facility] [place]

## 2019-12-03 NOTE — Telephone Encounter (Signed)
I called the patient to talk to her about her concerns.  She is just worried about having hives for a couple weeks after the booster shot.  She is scheduled to get it on Monday with me in [facility].  Her dermatologist recommended that she take 2 Zyrtec nightly starting tonight before she gets the booster on Monday.  I told her that was a great idea.  She is also worried about taking too many Zyrtec after the injection.  I reassured her that we have urticaria patients are on 8-10 times the recommended dosing of Zyrtec to suppress hives and they do very well with this.  I did reassure her that there has never been a case of Zyrtec overdose in the literature.  She does seem reassured by this.    She also tells me that she had to have another biopsy on Wednesday to look at a basal cell carcinoma. The results are pending.    Salvatore Marvel, MD [facility]

## 2019-12-06 ENCOUNTER — Ambulatory Visit (INDEPENDENT_AMBULATORY_CARE_PROVIDER_SITE_OTHER): Payer: Medicare HMO

## 2019-12-06 ENCOUNTER — Other Ambulatory Visit: Payer: Self-pay

## 2019-12-06 DIAGNOSIS — Z23 Encounter for immunization: Secondary | ICD-10-CM | POA: Diagnosis not present

## 2019-12-06 NOTE — Progress Notes (Signed)
   Covid-19 Vaccination Clinic  Name:  Jillian [name]    MRN: [id number] DOB: [birth year]  12/06/2019  [name] was observed post Covid-19 immunization for 30 minutes based on pre-vaccination screening without incident. She was provided with Vaccine Information Sheet and instruction to access the V-Safe system.   [name] was instructed to call 911 with any severe reactions post vaccine: Marland Kitchen Difficulty breathing  . Swelling of face and throat  . A fast heartbeat  . A bad rash all over body  . Dizziness and weakness

## 2019-12-14 DIAGNOSIS — K5792 Diverticulitis of intestine, part unspecified, without perforation or abscess without bleeding: Secondary | ICD-10-CM | POA: Insufficient documentation

## 2019-12-14 DIAGNOSIS — K5790 Diverticulosis of intestine, part unspecified, without perforation or abscess without bleeding: Secondary | ICD-10-CM

## 2019-12-14 DIAGNOSIS — E7800 Pure hypercholesterolemia, unspecified: Secondary | ICD-10-CM | POA: Insufficient documentation

## 2019-12-14 DIAGNOSIS — E119 Type 2 diabetes mellitus without complications: Secondary | ICD-10-CM | POA: Insufficient documentation

## 2019-12-14 DIAGNOSIS — K529 Noninfective gastroenteritis and colitis, unspecified: Secondary | ICD-10-CM | POA: Insufficient documentation

## 2019-12-14 DIAGNOSIS — M359 Systemic involvement of connective tissue, unspecified: Secondary | ICD-10-CM | POA: Insufficient documentation

## 2019-12-14 DIAGNOSIS — E039 Hypothyroidism, unspecified: Secondary | ICD-10-CM | POA: Insufficient documentation

## 2019-12-14 DIAGNOSIS — I10 Essential (primary) hypertension: Secondary | ICD-10-CM | POA: Insufficient documentation

## 2019-12-15 ENCOUNTER — Ambulatory Visit: Payer: Medicare HMO | Admitting: Physician Assistant

## 2019-12-15 ENCOUNTER — Encounter: Payer: Self-pay | Admitting: Physician Assistant

## 2019-12-15 ENCOUNTER — Telehealth: Payer: Self-pay | Admitting: Physician Assistant

## 2019-12-15 VITALS — BP 138/84 | HR 94 | Ht 64.0 in | Wt 212.0 lb

## 2019-12-15 DIAGNOSIS — R109 Unspecified abdominal pain: Secondary | ICD-10-CM | POA: Diagnosis not present

## 2019-12-15 DIAGNOSIS — K5900 Constipation, unspecified: Secondary | ICD-10-CM

## 2019-12-15 DIAGNOSIS — K660 Peritoneal adhesions (postprocedural) (postinfection): Secondary | ICD-10-CM

## 2019-12-15 DIAGNOSIS — R140 Abdominal distension (gaseous): Secondary | ICD-10-CM

## 2019-12-15 DIAGNOSIS — R143 Flatulence: Secondary | ICD-10-CM | POA: Diagnosis not present

## 2019-12-15 DIAGNOSIS — K6389 Other specified diseases of intestine: Secondary | ICD-10-CM

## 2019-12-15 MED ORDER — LINACLOTIDE 145 MCG PO CAPS: 145.0000 ug | ORAL_CAPSULE | Freq: Every day | ORAL | Status: DC

## 2019-12-15 NOTE — Progress Notes (Signed)
Subjective:    Patient ID: Jillian [name], female    DOB: 18-Oct-1945, 74 y.o.   MRN: [id number]  HPI Jillian [name] is a pleasant 74 year old white female, self-referred today, and requesting to establish with Dr. Henrene Pastor who was recommended by a good friend of hers. Patient has history of hypertension, diverticulosis, adult onset diabetes mellitus, prior diagnosis of IBS.  She has  chronic issues with her back..  She has prior history of small bowel obstruction, and multiple prior abdominal surgeries including abdominal hysterectomy, appendectomy, cholecystectomy and had ventral hernia repair x2 with mesh. She comes in today with complaints of 4 to 5-week history of nausea which she says occurs on a daily basis if she eats any more than small amounts at a time.  She gets bloated and full feeling, uncomfortable but has not been vomiting.  She has been having some problems with constipation and says if she does not have good bowel movements that it empty her bowel all of her symptoms are worse.  She has been going 3 to 4 days without a bowel movement and this definitely exacerbates her symptoms.  She says sometimes when she does have a bowel movement she will have solid stool followed by several loose stools.  She also mentions intermittent very small amounts of fecal soilage.  She has not been having any bleeding. She has tried MiraLAX in the past and says it makes her uncomfortable as if she can feel it going through her bowel. Patient had prior colonoscopy in [facility] in December 2018 per Dr. Caryl Comes.  Notes show a severe diverticulosis, no polyps. She also had work-up in the fall 2024 complaints of abdominal pain and distention.  She apparently had seen Dr. Benson Norway in September 2020, did not like him.  She did not have any procedures done.  She underwent a CT of the abdomen and pelvis at that time, without contrast which had shown an ovoid soft tissue mass in the right pelvis presumably related to residual  ovarian tissue, there is noted to be hernia mesh of the anterior abdominal wall. She then had MRI of the abdomen and pelvis, in October 2020 because of persistence of abdominal and pelvic pain.  This showed the previously described soft tissue density in the right pelvis favored to be unopacified loops of bowel, there was no abnormal soft tissue mass or nodularity identified.  She had extensive postoperative changes from ventral abdominal wall herniorrhaphy no recurrent hernia,.  There was convergence of small bowel loops and cecum within the midline anterior abdomen just below the ventral abdominal wall and an associated single loop of small bowel with mildly increased caliber at 3 cm, findings felt to represent postoperative adhesions with bowel tethering which may explain her abdominal pain.  She has a cecal bascule.,  No evidence for high-grade obstruction, extensive diverticulosis and hepatic steatosis.  Explained all of the MRI findings to the patient in our conversation today and she says that nobody had explained any of those findings to her.  She has been worried because of increase in abdominal pain and the new nausea etc. over the past couple of months that she has a cancer, as was suggested by that initial CT scan.  Review of Systems Pertinent positive and negative review of systems were noted in the above HPI section.  All other review of systems was otherwise negative.  Outpatient Encounter Medications as of 12/15/2019  Medication Sig  . Cholecalciferol 125 MCG (5000 UT) TABS Take by mouth.  Marland Kitchen  cyclobenzaprine (FLEXERIL) 10 MG tablet Take 10 mg by mouth 3 (three) times daily as needed for muscle spasms.  . Famotidine (PEPCID PO) Take by mouth.  Marland Kitchen ibuprofen (ADVIL,MOTRIN) 200 MG tablet Take 200 mg by mouth 3 (three) times daily as needed for moderate pain.  Marland Kitchen levothyroxine (SYNTHROID) 137 MCG tablet Take 137 mcg by mouth daily.  . magnesium gluconate (MAGONATE) 500 MG tablet Take by mouth.    . metFORMIN (GLUCOPHAGE-XR) 500 MG 24 hr tablet Take 1,000 mg by mouth 2 (two) times daily.  . rosuvastatin (CRESTOR) 20 MG tablet Take 20 mg by mouth daily.  Marland Kitchen telmisartan (MICARDIS) 40 MG tablet Take 40 mg by mouth daily.  Marland Kitchen linaclotide (LINZESS) 145 MCG CAPS capsule Take 1 capsule (145 mcg total) by mouth daily before breakfast.  . [DISCONTINUED] ondansetron (ZOFRAN) 4 MG tablet Take 4 mg by mouth every 6 (six) hours as needed for nausea or vomiting.   No facility-administered encounter medications on file as of 12/15/2019.   Allergies  Allergen Reactions  . Amoxicillin     Numbness  . Iodinated Diagnostic Agents     rash  . Luminal [Phenobarbital] Other (See Comments)    Blisters  . Mobic [Meloxicam]   . Penicillins     Has patient had a PCN reaction causing immediate rash, facial/tongue/throat swelling, SOB or lightheadedness with hypotension:Y Has patient had a PCN reaction causing severe rash involving mucus membranes or skin necrosis: N Has patient had a PCN reaction that required hospitalization: N Has patient had a PCN reaction occurring within the last 10 years: N If all of the above answers are "NO", then may proceed with Cephalosporin use.   . Allopurinol Rash  . Ciprofloxacin Rash  . Latex Rash   Patient Active Problem List   Diagnosis Date Noted  . Hypertension 12/14/2019  . Hypothyroidism 12/14/2019  . 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  . Hypercholesterolemia 12/14/2019  . Diabetes mellitus ([place]) 12/14/2019  . Autoimmune disease ([place]) 12/14/2019  . Diverticulosis 12/14/2019  . Diverticulitis 12/14/2019  . Foot pain, bilateral 09/13/2019   Social History   Socioeconomic History  . Marital status: Married    Spouse name: Not on file  . Number of children: 1  . Years of education: Not on file  . Highest education level: Not on file  Occupational History  . Not on file  Tobacco Use  . Smoking status: Never Smoker  . Smokeless tobacco: Never Used   Vaping Use  . Vaping Use: Never used  Substance and Sexual Activity  . Alcohol use: Yes  . Drug use: Never  . Sexual activity: Yes  Other Topics Concern  . Not on file  Social History Narrative  . Not on file   Social Determinants of Health   Financial Resource Strain:   . Difficulty of Paying Living Expenses: Not on file  Food Insecurity:   . Worried About Charity fundraiser in the Last Year: Not on file  . Ran Out of Food in the Last Year: Not on file  Transportation Needs:   . Lack of Transportation (Medical): Not on file  . Lack of Transportation (Non-Medical): Not on file  Physical Activity:   . Days of Exercise per Week: Not on file  . Minutes of Exercise per Session: Not on file  Stress:   . Feeling of Stress : Not on file  Social Connections:   . Frequency of Communication with Friends and Family: Not on file  . Frequency  of Social Gatherings with Friends and Family: Not on file  . Attends Religious Services: Not on file  . Active Member of Clubs or Organizations: Not on file  . Attends Archivist Meetings: Not on file  . Marital Status: Not on file  Intimate Partner Violence:   . Fear of Current or Ex-Partner: Not on file  . Emotionally Abused: Not on file  . Physically Abused: Not on file  . Sexually Abused: Not on file    Ms. Viernes's family history includes Colon cancer in her paternal grandmother.      Objective:    Vitals:   12/15/19 1328  BP: 138/84  Pulse: 94    Physical Exam Well-developed well-nourished older white female, in no acute distress.  Height, Weight,212 BMI36.  Ambulating with some difficulty  HEENT; nontraumatic normocephalic, EOMI, PER R LA, sclera anicteric. Oropharynx; not examined Neck; supple, no JVD Cardiovascular; regular rate and rhythm with S1-S2, no murmur rub or gallop Pulmonary; Clear bilaterally Abdomen; soft, obese, multiple incisional scars, nondistended, no palpable mass or hepatosplenomegaly, bowel  sounds are active.  There is some minimal tenderness in the mid and lower abdomen, no guarding or rebound Rectal; not done today Skin; benign exam, no jaundice rash or appreciable lesions Extremities; no clubbing cyanosis or edema skin warm and dry Neuro/Psych; alert and oriented x4, grossly nonfocal mood and affect appropriate       Assessment & Plan:   #72 74 year old white female with 4 to 5-week history of nausea, early satiety, intermittent abdominal bloating and distention, in setting of chronic constipation.  Patient says abdominal symptoms all improved with good emptying of her bowel. She has had multiple abdominal surgeries, and 2 ventral hernia repairs with mesh and as outlined above per previous MRI done in October 2020 she has extensive adhesions and tethering of loops of small bowel and cecum midline in the low abdomen behind the prior ventral hernia.  I suspect her chronic symptoms are secondary to extensive adhesions and she may have intermittent low-grade obstructions accounting for more acute symptoms. Her current symptoms are different over the past month or so with nausea early satiety etc. in addition to her other symptoms and therefore need to consider other new process, intra-abdominal inflammatory process malignancy etc.  #2 extensive diverticulosis 3.  Colon cancer surveillance-up-to-date with negative colonoscopy 2018 [place] 4.  Adult onset diabetes mellitus 5.  Chronic back pain, degenerative disc disease 6.  Hypertension 7.  Status post cholecystectomy hysterectomy appendectomy and ventral hernia repair x2 with mesh  Plan; patient is allergic to CT contrast, therefore will proceed with MRI of the abdomen and pelvis with and without contrast. Patient was given a copy of a gastroparesis diet, asked to review the step 3 diet and follow which is generally a low roughage type diet. She has found magnesium to be helpful for constipation, advise she take 400 mg of  magnesium daily alternating with 600 mg of magnesium daily, with goal to have a bowel movement on a daily basis even if stools are looser as this definitely helps her abdominal bloating and discomfort. Further recommendations pending results of MRI.  Patient will be established with Dr. Scarlette Shorts at her request.    Alfredia Ferguson PA-C 12/15/2019   Cc: Burnard Bunting, MD

## 2019-12-15 NOTE — Telephone Encounter (Signed)
Pt is requesting a call back from a nurse to discuss the contrast for her MRI, pt states she is allergic.

## 2019-12-15 NOTE — Patient Instructions (Addendum)
If you are age 72 or older, your body mass index should be between 23-30. Your Body mass index is 36.39 kg/m. If this is out of the aforementioned range listed, please consider follow up with your Primary Care Provider.  If you are age 43 or younger, your body mass index should be between 19-25. Your Body mass index is 36.39 kg/m. If this is out of the aformentioned range listed, please consider follow up with your Primary Care Provider.   You have been scheduled for an MRI at [facility] on 01/04/20. Your appointment time is 3:50 pm. Please arrive 30 minutes prior to your appointment time for registration purposes. Please make certain not to have anything to eat or drink 4 hours prior to your test. In addition, if you have any metal in your body, have a pacemaker or defibrillator, please be sure to let your ordering physician know. Please be sure not to wear any zippers, or any clothing with clips. Try to wear cotton clothing if possible. This test typically takes 45 minutes to 1 hour to complete. Should you need to reschedule, please call [phone] to do so.  Try Linzess 145 mcg 1 capsule daily. Call the office and let us know if the samples work and we will send in a prescription to your pharmacy. If Linzess is not helpful then try 400 mg of Magnesium daily, increase to 600 mg if needed.  Follow a Gastroparesis diet Step 3.  Follow up pending the results of your MRI or as needed for now.

## 2019-12-16 ENCOUNTER — Other Ambulatory Visit: Payer: Self-pay

## 2019-12-16 MED ORDER — GLYCOPYRROLATE 2 MG PO TABS: 2.0000 mg | ORAL_TABLET | Freq: Two times a day (BID) | ORAL | 4 refills | Status: DC

## 2019-12-16 NOTE — Telephone Encounter (Signed)
Spoke with patient about the contrast allergy that she has which is to CT contrast and not MRI contrast.  She was also wondering about what she can do for her abdominal pain from her diarrhea? She is not wanting to take any Imodium since it stops her up for days, she is just wanting advice on how to take care of the pain.

## 2019-12-16 NOTE — Telephone Encounter (Signed)
Lets try an antispasmotic - which may help with pain and slow her just a little - send rx for glycopyrolate 2mg  - may take one today then start one po qam - if having a bad day with loose stool can take a second dose  in the afternoon  # 60 /4

## 2019-12-16 NOTE — Telephone Encounter (Signed)
Patient agrees to try this plan of care.

## 2019-12-16 NOTE — Progress Notes (Signed)
Assessment and plans reviewed  

## 2019-12-16 NOTE — Telephone Encounter (Signed)
Spoke with the patient. She describes watery stools that pass every time she goes to the restroom. She has passed 4 or 5 such stools this morning. She has an ache in the lower abdomen described as being like a toothache. The heating pad feels comforting. She felt cold last night but does not think she is feverish. Symptoms started Monday. She has not taken Mg+ since Monday because of this. States this is the pattern she has been dealing with. Understands she should not take any antidiarrheal medications. She is not eating much. She has had soup and crackers in the past 14 hours. She does not feel hungry.  Suggestions?

## 2019-12-20 DIAGNOSIS — R6883 Chills (without fever): Secondary | ICD-10-CM | POA: Diagnosis not present

## 2019-12-20 DIAGNOSIS — E1169 Type 2 diabetes mellitus with other specified complication: Secondary | ICD-10-CM | POA: Diagnosis not present

## 2019-12-20 DIAGNOSIS — Z1152 Encounter for screening for COVID-19: Secondary | ICD-10-CM | POA: Diagnosis not present

## 2019-12-20 DIAGNOSIS — J0100 Acute maxillary sinusitis, unspecified: Secondary | ICD-10-CM | POA: Diagnosis not present

## 2019-12-23 ENCOUNTER — Other Ambulatory Visit: Payer: Self-pay

## 2019-12-23 ENCOUNTER — Telehealth: Payer: Self-pay | Admitting: Physician Assistant

## 2019-12-23 DIAGNOSIS — K660 Peritoneal adhesions (postprocedural) (postinfection): Secondary | ICD-10-CM

## 2019-12-23 DIAGNOSIS — R109 Unspecified abdominal pain: Secondary | ICD-10-CM

## 2019-12-23 MED ORDER — POLYETHYLENE GLYCOL 3350 17 GM/SCOOP PO POWD: ORAL | 0 refills | Status: AC

## 2019-12-23 MED ORDER — ONDANSETRON HCL 4 MG PO TABS: 4.0000 mg | ORAL_TABLET | Freq: Four times a day (QID) | ORAL | 0 refills | Status: DC | PRN

## 2019-12-23 NOTE — Telephone Encounter (Signed)
Spoke with the patient and discussed the plan in detail with her. She states she is taking Magnesium, not mag citrate. This was my misunderstanding. She is agreeable to trying Miralax instead. She agrees to Zofran for the nausea so she can tolerate PO fluids. She will try Pedialyte for re-hydration. Declines Tramadol. Asked to use Advil. Advised against it and suggested Tylenol.  She will go to the ED if she acutely worsens.

## 2019-12-23 NOTE — Telephone Encounter (Signed)
Moved the MRI abd and pelvis w/wo contrast to [facility] for 12/29/19 at 4:00 pm. Patient accepts this appointment. She will arrive at 3:30 pm. She will fast for 4 hours prior to her test at 4:00 pm. She states she is barely eating, feels very nauseated but without vomiting, her abdomen hurts. She is drinking mag citrate at night. Passing "muddy water" per rectum. Has a passage of this watery stool with any PO intake. She admits difficulty maintaining hydration due to her nausea and pain. Afebrile. "Just laying around." Hesitant to take the antispasmodic due to her fear of it slowing her passage of stool.

## 2019-12-23 NOTE — Telephone Encounter (Signed)
Please make sure she has an antiemetic- Zofran 4mg  q 6 hours prn - encourage her to take so can push hydration .Why is she drinking Mag Citrate ? May aggravate abdominal pain- Mirlax qd-BID should be gentler - if she needs pain med -ok with Tramadol 50 mg sparingly as may constipate # 25 /0

## 2019-12-29 ENCOUNTER — Ambulatory Visit (HOSPITAL_COMMUNITY): Payer: Medicare HMO

## 2020-01-03 ENCOUNTER — Ambulatory Visit (HOSPITAL_COMMUNITY)
Admission: RE | Admit: 2020-01-03 | Discharge: 2020-01-03 | Disposition: A | Discharge status: Home / Self Care | Payer: Medicare HMO | Source: Ambulatory Visit | Attending: Physician Assistant | Admitting: Physician Assistant

## 2020-01-03 DIAGNOSIS — R109 Unspecified abdominal pain: Secondary | ICD-10-CM

## 2020-01-03 DIAGNOSIS — K5900 Constipation, unspecified: Secondary | ICD-10-CM | POA: Diagnosis present

## 2020-01-03 DIAGNOSIS — R140 Abdominal distension (gaseous): Secondary | ICD-10-CM

## 2020-01-03 DIAGNOSIS — Z9049 Acquired absence of other specified parts of digestive tract: Secondary | ICD-10-CM | POA: Diagnosis not present

## 2020-01-03 DIAGNOSIS — K660 Peritoneal adhesions (postprocedural) (postinfection): Secondary | ICD-10-CM | POA: Diagnosis present

## 2020-01-03 DIAGNOSIS — K5730 Diverticulosis of large intestine without perforation or abscess without bleeding: Secondary | ICD-10-CM | POA: Diagnosis not present

## 2020-01-03 DIAGNOSIS — K6389 Other specified diseases of intestine: Secondary | ICD-10-CM | POA: Diagnosis present

## 2020-01-03 DIAGNOSIS — R143 Flatulence: Secondary | ICD-10-CM

## 2020-01-03 DIAGNOSIS — Z90710 Acquired absence of both cervix and uterus: Secondary | ICD-10-CM | POA: Diagnosis not present

## 2020-01-03 MED ORDER — GADOBUTROL 1 MMOL/ML IV SOLN
10.0000 mL | Freq: Once | INTRAVENOUS | Status: AC | PRN
  Administered 2020-01-03: 10 mL via INTRAVENOUS

## 2020-01-04 ENCOUNTER — Other Ambulatory Visit: Payer: Medicare HMO

## 2020-01-04 ENCOUNTER — Telehealth: Payer: Self-pay | Admitting: Physician Assistant

## 2020-01-05 ENCOUNTER — Other Ambulatory Visit (HOSPITAL_COMMUNITY): Payer: Medicare HMO

## 2020-01-05 NOTE — Telephone Encounter (Signed)
See result note.  

## 2020-01-05 NOTE — Telephone Encounter (Signed)
Pt calling again or results

## 2020-01-09 ENCOUNTER — Other Ambulatory Visit: Payer: Medicare HMO

## 2020-01-18 DIAGNOSIS — M47816 Spondylosis without myelopathy or radiculopathy, lumbar region: Secondary | ICD-10-CM | POA: Diagnosis not present

## 2020-02-07 DIAGNOSIS — M47816 Spondylosis without myelopathy or radiculopathy, lumbar region: Secondary | ICD-10-CM | POA: Diagnosis not present

## 2020-02-09 DIAGNOSIS — E039 Hypothyroidism, unspecified: Secondary | ICD-10-CM | POA: Diagnosis not present

## 2020-02-09 DIAGNOSIS — E669 Obesity, unspecified: Secondary | ICD-10-CM | POA: Diagnosis not present

## 2020-02-09 DIAGNOSIS — K5909 Other constipation: Secondary | ICD-10-CM | POA: Diagnosis not present

## 2020-02-09 DIAGNOSIS — I10 Essential (primary) hypertension: Secondary | ICD-10-CM | POA: Diagnosis not present

## 2020-02-09 DIAGNOSIS — E1140 Type 2 diabetes mellitus with diabetic neuropathy, unspecified: Secondary | ICD-10-CM | POA: Diagnosis not present

## 2020-03-02 ENCOUNTER — Ambulatory Visit: Payer: Medicare HMO | Admitting: Internal Medicine

## 2020-03-07 DIAGNOSIS — M47816 Spondylosis without myelopathy or radiculopathy, lumbar region: Secondary | ICD-10-CM | POA: Diagnosis not present

## 2020-03-15 DIAGNOSIS — E039 Hypothyroidism, unspecified: Secondary | ICD-10-CM | POA: Diagnosis not present

## 2020-03-24 DIAGNOSIS — M47816 Spondylosis without myelopathy or radiculopathy, lumbar region: Secondary | ICD-10-CM | POA: Diagnosis not present

## 2020-03-28 HISTORY — PX: RADIOFREQUENCY ABLATION NERVES: SUR1070

## 2020-03-30 DIAGNOSIS — M47816 Spondylosis without myelopathy or radiculopathy, lumbar region: Secondary | ICD-10-CM | POA: Diagnosis not present

## 2020-04-12 ENCOUNTER — Ambulatory Visit (INDEPENDENT_AMBULATORY_CARE_PROVIDER_SITE_OTHER): Payer: Medicare HMO | Admitting: Internal Medicine

## 2020-04-12 ENCOUNTER — Encounter: Payer: Self-pay | Admitting: Internal Medicine

## 2020-04-12 VITALS — BP 140/80 | HR 96 | Ht 63.0 in | Wt 204.4 lb

## 2020-04-12 DIAGNOSIS — R151 Fecal smearing: Secondary | ICD-10-CM | POA: Diagnosis not present

## 2020-04-12 DIAGNOSIS — K580 Irritable bowel syndrome with diarrhea: Secondary | ICD-10-CM | POA: Diagnosis not present

## 2020-04-12 DIAGNOSIS — K219 Gastro-esophageal reflux disease without esophagitis: Secondary | ICD-10-CM | POA: Diagnosis not present

## 2020-04-12 DIAGNOSIS — K6289 Other specified diseases of anus and rectum: Secondary | ICD-10-CM | POA: Diagnosis not present

## 2020-04-12 DIAGNOSIS — R110 Nausea: Secondary | ICD-10-CM

## 2020-04-12 NOTE — Patient Instructions (Signed)
Take 2 tablespoons of Citrucel daily in 8-12 ounces of juice or water  Use Desitin on affected area  Resume Omeprazole

## 2020-04-13 ENCOUNTER — Telehealth: Payer: Self-pay | Admitting: Internal Medicine

## 2020-04-13 ENCOUNTER — Encounter: Payer: Self-pay | Admitting: Internal Medicine

## 2020-04-13 NOTE — Telephone Encounter (Signed)
Pt wants to know if she could take citrucel in pill form instead of powder.

## 2020-04-13 NOTE — Telephone Encounter (Signed)
Pt was here yesterday and told to take citrucel powder. She states that made her sick this am but she went online and saw where this comes in a pill form. She wanted to see if that was ok for her to take. Please advise.

## 2020-04-13 NOTE — Telephone Encounter (Signed)
Okay.  Start with 4 Citrucel pills daily.  Can increase if needed to achieve desired result

## 2020-04-13 NOTE — Telephone Encounter (Signed)
Spoke with pt and she is aware.

## 2020-04-13 NOTE — Progress Notes (Signed)
HISTORY OF PRESENT ILLNESS:  Jillian [name] is a 75 y.o. female, acquaintance of Therese Sarah and her husband NR (now deceased), who established in this office December 15, 2019 regarding myriad of GI complaints such as nausea, early satiety, intermittent abdominal bloating with distention, and constipation.  This on the background of prior GI care elsewhere including colonoscopy in [place] 2018, multiple abdominal surgeries, history of bowel obstruction secondary to adhesions.  See that dictation.  She presents today with concerns over impending bowel obstruction as she developed some issues with constipation.  Thereafter she describes constipation alternating with loose stools.  She has noticed when her stools are loose she might experience fecal incontinence or fecal leakage.  There is associated perirectal burning discomfort.  She also has intermittent abdominal bloating and cramping which is consistent with her known irritable bowel, and unchanged.  She continues with some intermittent mild nausea but no vomiting.  Her weight has been stable.  She does have history of GERD which is managed with Pepcid.  She does experience significant breakthrough.  Had been on PPI previously.  She has completed her COVID vaccination series plus booster  REVIEW OF SYSTEMS:  All non-GI ROS negative unless otherwise stated in the HPI except for sinus allergy, anxiety, back pain, visual change, depression, itching, headaches, skin rash, ankle swelling, urinary leakage  Past Medical History:  Diagnosis Date  . Bowel obstruction ([place])   . Diabetes mellitus without complication ([place])   . Diverticulitis   . GERD (gastroesophageal reflux disease)   . Gout   . High cholesterol   . Hypertension   . Hypothyroidism   . IBS (irritable bowel syndrome)   . Neck pain   . Spondylosis of lumbar spine     Past Surgical History:  Procedure Laterality Date  . ABDOMINAL HYSTERECTOMY    . APPENDECTOMY    . CARDIAC  CATHETERIZATION    . CHOLECYSTECTOMY    . FRACTURE SURGERY    . HERNIA REPAIR    . RADIOFREQUENCY ABLATION NERVES  03/2020   on back  . TONSILLECTOMY    . WRIST FRACTURE SURGERY Right     Social History Ginnie Monchel Pollitt  reports that she has never smoked. She has never used smokeless tobacco. She reports current alcohol use. She reports that she does not use drugs.  family history includes Colon cancer in her paternal grandmother.  Allergies  Allergen Reactions  . Amoxicillin     Numbness  . Iodinated Diagnostic Agents     rash  . Luminal [Phenobarbital] Other (See Comments)    Blisters  . Mobic [Meloxicam]   . Penicillins     Has patient had a PCN reaction causing immediate rash, facial/tongue/throat swelling, SOB or lightheadedness with hypotension:Y Has patient had a PCN reaction causing severe rash involving mucus membranes or skin necrosis: N Has patient had a PCN reaction that required hospitalization: N Has patient had a PCN reaction occurring within the last 10 years: N If all of the above answers are "NO", then may proceed with Cephalosporin use.   . Allopurinol Rash  . Ciprofloxacin Rash  . Latex Rash       PHYSICAL EXAMINATION: Vital signs: BP 140/80 (BP Location: Left Arm, Patient Position: Sitting, Cuff Size: Normal)   Pulse 96   Ht '5\' 3"'  (1.6 m) Comment: height meaured without shoes  Wt 204 lb 6 oz (92.7 kg)   BMI 36.20 kg/m   Constitutional: Pleasant, generally well-appearing, no acute distress Psychiatric: alert  and oriented x3, cooperative Eyes: extraocular movements intact, anicteric, conjunctiva pink Mouth: oral pharynx moist, no lesions Neck: supple no lymphadenopathy Cardiovascular: heart regular rate and rhythm, no murmur Lungs: clear to auscultation bilaterally Abdomen: soft, obese, nontender, nondistended, no obvious ascites, no peritoneal signs, normal bowel sounds, no organomegaly Rectal: Omitted Extremities: no clubbing, cyanosis,  or lower extremity edema bilaterally Skin: no lesions on visible extremities Neuro: No focal deficits.  Cranial nerves intact  ASSESSMENT:  1.  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  Fecal incontinence with loose stools 3.  Colonoscopy [place] 2018 4.  GERD.  Active symptoms on H2 receptor antagonist therapy 5.  Nausea.  Suspect secondary to inadequately treated GERD 6.  Obesity 7.  Perirectal discomfort   PLAN:  1.  Reflux precautions with attention to weight loss 2.  Resume PPI daily.  Medication risks reviewed 3.  Citrucel 2 tablespoons daily.  This to improve consistency of bowel habits.  This should reduce episodes of incontinence less in the way of loose stools. 4.  Desitin.  Apply to the area perirectal irritation 5.  GI follow-up as needed Total time of 30 minutes was spent preparing seeing patient, reviewing test, obtaining comprehensive history and performing medically appropriate physical examination.  Counseling the patient regarding her above listed issues.  Ordering medications and directed medical therapy.  Finally, documenting clinical information in the health record

## 2020-04-19 DIAGNOSIS — D2261 Melanocytic nevi of right upper limb, including shoulder: Secondary | ICD-10-CM | POA: Diagnosis not present

## 2020-04-19 DIAGNOSIS — Z85828 Personal history of other malignant neoplasm of skin: Secondary | ICD-10-CM | POA: Diagnosis not present

## 2020-04-19 DIAGNOSIS — L900 Lichen sclerosus et atrophicus: Secondary | ICD-10-CM | POA: Diagnosis not present

## 2020-04-19 DIAGNOSIS — L570 Actinic keratosis: Secondary | ICD-10-CM | POA: Diagnosis not present

## 2020-04-19 DIAGNOSIS — L298 Other pruritus: Secondary | ICD-10-CM | POA: Diagnosis not present

## 2020-04-19 DIAGNOSIS — D2271 Melanocytic nevi of right lower limb, including hip: Secondary | ICD-10-CM | POA: Diagnosis not present

## 2020-04-19 DIAGNOSIS — D2272 Melanocytic nevi of left lower limb, including hip: Secondary | ICD-10-CM | POA: Diagnosis not present

## 2020-04-19 DIAGNOSIS — X32XXXA Exposure to sunlight, initial encounter: Secondary | ICD-10-CM | POA: Diagnosis not present

## 2020-04-19 DIAGNOSIS — D2262 Melanocytic nevi of left upper limb, including shoulder: Secondary | ICD-10-CM | POA: Diagnosis not present

## 2020-05-08 DIAGNOSIS — M545 Low back pain, unspecified: Secondary | ICD-10-CM | POA: Diagnosis not present

## 2020-05-10 DIAGNOSIS — M792 Neuralgia and neuritis, unspecified: Secondary | ICD-10-CM | POA: Diagnosis not present

## 2020-05-10 DIAGNOSIS — M8949 Other hypertrophic osteoarthropathy, multiple sites: Secondary | ICD-10-CM | POA: Diagnosis not present

## 2020-05-16 DIAGNOSIS — L986 Other infiltrative disorders of the skin and subcutaneous tissue: Secondary | ICD-10-CM | POA: Diagnosis not present

## 2020-05-16 DIAGNOSIS — L298 Other pruritus: Secondary | ICD-10-CM | POA: Diagnosis not present

## 2020-05-16 DIAGNOSIS — L538 Other specified erythematous conditions: Secondary | ICD-10-CM | POA: Diagnosis not present

## 2020-05-16 DIAGNOSIS — D485 Neoplasm of uncertain behavior of skin: Secondary | ICD-10-CM | POA: Diagnosis not present

## 2020-05-17 ENCOUNTER — Other Ambulatory Visit: Payer: Self-pay | Admitting: Internal Medicine

## 2020-05-17 DIAGNOSIS — Z1231 Encounter for screening mammogram for malignant neoplasm of breast: Secondary | ICD-10-CM

## 2020-06-28 DIAGNOSIS — G894 Chronic pain syndrome: Secondary | ICD-10-CM | POA: Diagnosis not present

## 2020-06-29 DIAGNOSIS — Z85828 Personal history of other malignant neoplasm of skin: Secondary | ICD-10-CM | POA: Diagnosis not present

## 2020-07-03 DIAGNOSIS — L298 Other pruritus: Secondary | ICD-10-CM | POA: Diagnosis not present

## 2020-07-03 DIAGNOSIS — I10 Essential (primary) hypertension: Secondary | ICD-10-CM | POA: Diagnosis not present

## 2020-07-03 DIAGNOSIS — E039 Hypothyroidism, unspecified: Secondary | ICD-10-CM | POA: Diagnosis not present

## 2020-07-03 DIAGNOSIS — E1140 Type 2 diabetes mellitus with diabetic neuropathy, unspecified: Secondary | ICD-10-CM | POA: Diagnosis not present

## 2020-07-05 DIAGNOSIS — M792 Neuralgia and neuritis, unspecified: Secondary | ICD-10-CM | POA: Diagnosis not present

## 2020-07-05 DIAGNOSIS — M8949 Other hypertrophic osteoarthropathy, multiple sites: Secondary | ICD-10-CM | POA: Diagnosis not present

## 2020-07-05 DIAGNOSIS — M5416 Radiculopathy, lumbar region: Secondary | ICD-10-CM | POA: Diagnosis not present

## 2020-07-10 ENCOUNTER — Ambulatory Visit
Admission: RE | Admit: 2020-07-10 | Discharge: 2020-07-10 | Disposition: A | Discharge status: Home / Self Care | Payer: Medicare HMO | Source: Ambulatory Visit | Attending: Internal Medicine | Admitting: Internal Medicine

## 2020-07-10 ENCOUNTER — Other Ambulatory Visit: Payer: Self-pay

## 2020-07-10 DIAGNOSIS — Z1231 Encounter for screening mammogram for malignant neoplasm of breast: Secondary | ICD-10-CM | POA: Diagnosis not present

## 2020-07-11 DIAGNOSIS — L900 Lichen sclerosus et atrophicus: Secondary | ICD-10-CM | POA: Diagnosis not present

## 2020-07-11 DIAGNOSIS — L298 Other pruritus: Secondary | ICD-10-CM | POA: Diagnosis not present

## 2020-08-04 DIAGNOSIS — Z7984 Long term (current) use of oral hypoglycemic drugs: Secondary | ICD-10-CM | POA: Diagnosis not present

## 2020-08-04 DIAGNOSIS — G629 Polyneuropathy, unspecified: Secondary | ICD-10-CM | POA: Diagnosis not present

## 2020-08-04 DIAGNOSIS — E1140 Type 2 diabetes mellitus with diabetic neuropathy, unspecified: Secondary | ICD-10-CM | POA: Diagnosis not present

## 2020-08-04 DIAGNOSIS — Z79899 Other long term (current) drug therapy: Secondary | ICD-10-CM | POA: Diagnosis not present

## 2020-08-07 DIAGNOSIS — E1169 Type 2 diabetes mellitus with other specified complication: Secondary | ICD-10-CM | POA: Diagnosis not present

## 2020-08-07 DIAGNOSIS — M109 Gout, unspecified: Secondary | ICD-10-CM | POA: Diagnosis not present

## 2020-08-07 DIAGNOSIS — R8290 Unspecified abnormal findings in urine: Secondary | ICD-10-CM | POA: Diagnosis not present

## 2020-08-07 DIAGNOSIS — E785 Hyperlipidemia, unspecified: Secondary | ICD-10-CM | POA: Diagnosis not present

## 2020-08-07 DIAGNOSIS — E039 Hypothyroidism, unspecified: Secondary | ICD-10-CM | POA: Diagnosis not present

## 2020-08-14 ENCOUNTER — Other Ambulatory Visit: Payer: Self-pay | Admitting: Internal Medicine

## 2020-08-14 DIAGNOSIS — E1149 Type 2 diabetes mellitus with other diabetic neurological complication: Secondary | ICD-10-CM | POA: Diagnosis not present

## 2020-08-14 DIAGNOSIS — E1140 Type 2 diabetes mellitus with diabetic neuropathy, unspecified: Secondary | ICD-10-CM | POA: Diagnosis not present

## 2020-08-14 DIAGNOSIS — Z1212 Encounter for screening for malignant neoplasm of rectum: Secondary | ICD-10-CM | POA: Diagnosis not present

## 2020-08-14 DIAGNOSIS — Z0000 Encounter for general adult medical examination without abnormal findings: Secondary | ICD-10-CM | POA: Diagnosis not present

## 2020-08-14 DIAGNOSIS — Z1331 Encounter for screening for depression: Secondary | ICD-10-CM | POA: Diagnosis not present

## 2020-08-14 DIAGNOSIS — R1084 Generalized abdominal pain: Secondary | ICD-10-CM | POA: Diagnosis not present

## 2020-08-14 DIAGNOSIS — K219 Gastro-esophageal reflux disease without esophagitis: Secondary | ICD-10-CM | POA: Diagnosis not present

## 2020-08-14 DIAGNOSIS — Z1339 Encounter for screening examination for other mental health and behavioral disorders: Secondary | ICD-10-CM | POA: Diagnosis not present

## 2020-08-14 DIAGNOSIS — E785 Hyperlipidemia, unspecified: Secondary | ICD-10-CM | POA: Diagnosis not present

## 2020-08-14 DIAGNOSIS — E039 Hypothyroidism, unspecified: Secondary | ICD-10-CM | POA: Diagnosis not present

## 2020-08-14 DIAGNOSIS — E669 Obesity, unspecified: Secondary | ICD-10-CM | POA: Diagnosis not present

## 2020-08-16 DIAGNOSIS — K639 Disease of intestine, unspecified: Secondary | ICD-10-CM | POA: Diagnosis not present

## 2020-08-16 DIAGNOSIS — N200 Calculus of kidney: Secondary | ICD-10-CM | POA: Diagnosis not present

## 2020-08-16 DIAGNOSIS — K5730 Diverticulosis of large intestine without perforation or abscess without bleeding: Secondary | ICD-10-CM | POA: Diagnosis not present

## 2020-08-17 ENCOUNTER — Telehealth: Payer: Self-pay

## 2020-08-17 DIAGNOSIS — R935 Abnormal findings on diagnostic imaging of other abdominal regions, including retroperitoneum: Secondary | ICD-10-CM

## 2020-08-17 NOTE — Telephone Encounter (Signed)
Per Dr. Henrene Pastor colon scheduled for 6/24 at 2:30 pm.  Detailed prep instructions explained to the patient and spouse.  Patient verbalized understanding.  Plenvu sample provided to the patient today.  LOT [id number] Exp 09/2021.  SL zofran provided to patient to take before drinking each dose of prep.

## 2020-08-18 ENCOUNTER — Other Ambulatory Visit: Payer: Self-pay

## 2020-08-18 ENCOUNTER — Telehealth: Payer: Self-pay | Admitting: Internal Medicine

## 2020-08-18 ENCOUNTER — Ambulatory Visit (AMBULATORY_SURGERY_CENTER): Payer: Medicare HMO | Admitting: Internal Medicine

## 2020-08-18 ENCOUNTER — Encounter: Payer: Self-pay | Admitting: Internal Medicine

## 2020-08-18 VITALS — BP 145/89 | HR 97 | Temp 96.6°F | Resp 20 | Ht 63.0 in | Wt 204.0 lb

## 2020-08-18 DIAGNOSIS — K5730 Diverticulosis of large intestine without perforation or abscess without bleeding: Secondary | ICD-10-CM | POA: Diagnosis not present

## 2020-08-18 DIAGNOSIS — R933 Abnormal findings on diagnostic imaging of other parts of digestive tract: Secondary | ICD-10-CM | POA: Diagnosis not present

## 2020-08-18 DIAGNOSIS — R935 Abnormal findings on diagnostic imaging of other abdominal regions, including retroperitoneum: Secondary | ICD-10-CM

## 2020-08-18 DIAGNOSIS — K589 Irritable bowel syndrome without diarrhea: Secondary | ICD-10-CM | POA: Diagnosis not present

## 2020-08-18 DIAGNOSIS — K580 Irritable bowel syndrome with diarrhea: Secondary | ICD-10-CM

## 2020-08-18 MED ORDER — SODIUM CHLORIDE 0.9 % IV SOLN: 500.0000 mL | Freq: Once | INTRAVENOUS | Status: DC

## 2020-08-18 NOTE — Progress Notes (Signed)
Medical history reviewed with no changes noted. VS assessed by C.W 

## 2020-08-18 NOTE — Telephone Encounter (Signed)
Inbound call from patient stating they have been experiencing diarrhea and needing to go to the restroom every 2-3 minute and is wanting to know if it will subside before coming to her procedure or is there something she can take since ride is 40 minutes to practice.  Also states because she's having to go to the restroom a lot, her bottom is raw and wants to know if she can put vasoline on it.  Please advise.

## 2020-08-18 NOTE — Op Note (Signed)
[facility] Patient Name: Carole Fronczak Procedure Date: 08/18/2020 2:44 PM MRN: [id number] Endoscopist: Docia Chuck. Henrene Pastor , MD Age: 75 Referring MD:  Date of Birth: [birth year] Gender: Female Account #: [id number] Procedure:                Colonoscopy Indications:              Abnormal CT of the GI tract (CT scan performed [place]                            22, 2022 was said to show a 3.6 cm mass in the                            cecum). Patient did have a colonoscopy in                            [facility] December 2018 with                            diverticulosis only. Medicines:                Monitored Anesthesia Care Procedure:                Pre-Anesthesia Assessment:                           - Prior to the procedure, a History and Physical                            was performed, and patient medications and                            allergies were reviewed. The patient's tolerance of                            previous anesthesia was also reviewed. The risks                            and benefits of the procedure and the sedation                            options and risks were discussed with the patient.                            All questions were answered, and informed consent                            was obtained. Prior Anticoagulants: The patient has                            taken no previous anticoagulant or antiplatelet                            agents. ASA Grade Assessment: II - A patient with  mild systemic disease. After reviewing the risks                            and benefits, the patient was deemed in                            satisfactory condition to undergo the procedure.                           After obtaining informed consent, the colonoscope                            was passed under direct vision. Throughout the                            procedure, the patient's blood pressure, pulse, and                             oxygen saturations were monitored continuously. The                            Olympus CF-HQ190L [id number]) Colonoscope was                            introduced through the anus and advanced to the the                            cecum, identified by appendiceal orifice and                            ileocecal valve. The ileocecal valve, appendiceal                            orifice, and rectum were photographed. The quality                            of the bowel preparation was excellent. The                            colonoscopy was performed without difficulty. The                            patient tolerated the procedure well. The bowel                            preparation used was Prepopik via split dose                            instruction. Scope In: 2:58:27 PM Scope Out: 3:18:21 PM Scope Withdrawal Time: 0 hours 9 minutes 32 seconds  Total Procedure Duration: 0 hours 19 minutes 54 seconds  Findings:                 Many small and large-mouthed diverticula were found  in the left colon. There was significant                            rectosigmoid/sigmoid stenosis from diverticular                            disease.                           The exam was otherwise without abnormality on                            direct and retroflexion views. No cecal mass. Complications:            No immediate complications. Estimated blood loss:                            None. Estimated Blood Loss:     Estimated blood loss: none. Impression:               - Diverticulosis in the left colon, with                            rectosigmoid stenosis.                           - The examination was otherwise normal on direct                            and retroflexion views.                           - No cecal mass. Recommendation:           - Repeat colonoscopy is not recommended for                            screening purposes.                            - Patient has a contact number available for                            emergencies. The signs and symptoms of potential                            delayed complications were discussed with the                            patient. Return to normal activities tomorrow.                            Written discharge instructions were provided to the                            patient.                           -  Resume previous diet.                           - Continue present medications. Docia Chuck. Henrene Pastor, MD 08/18/2020 3:28:22 PM This report has been signed electronically.

## 2020-08-18 NOTE — Progress Notes (Signed)
1532--pt arrives to PACU and abdomen is distended and tender, pt is awake and unable to pass gas, c/o pain to right hip that is ongoing and down her leg, warm pack to abdoment and instruct pt to try and bear down to pass gas.  1543-pt still having no flatus and abdomen distended despite numerous movements and strategies.  Dr Henrene Pastor notified, Dr Henrene Pastor assesses pt and states she will go back to the procedure room to remove the air, pt is desiring to do this.  1652--pt is feeling much better, back  to PACU and VSS, feeling better.  1712--VSS, pt feeling much better, instructions given and pt to slowly advance diet from clear liquids at first and see how she feels.

## 2020-08-18 NOTE — Progress Notes (Signed)
1630 Pt brought back to endoscopy suite for retained air

## 2020-08-18 NOTE — Telephone Encounter (Signed)
Called and spoke with pt about prep questions. Pt was concerned with having an accident while in route to [facility]. Advised pt that she could put down towels in car, or wear a depend. Also advised pt that she could use Vaseline on rectum if she had any pain or irritation. Pt verbalized understanding.

## 2020-08-18 NOTE — Patient Instructions (Addendum)
Handout given for diverticulosis. ? ?YOU HAD AN ENDOSCOPIC PROCEDURE TODAY AT THE [facility]:   Refer to the procedure report that was given to you for any specific questions about what was found during the examination.  If the procedure report does not answer your questions, please call your gastroenterologist to clarify.  If you requested that your care partner not be given the details of your procedure findings, then the procedure report has been included in a sealed envelope for you to review at your convenience later. ? ?YOU SHOULD EXPECT: Some feelings of bloating in the abdomen. Passage of more gas than usual.  Walking can help get rid of the air that was put into your GI tract during the procedure and reduce the bloating. If you had a lower endoscopy (such as a colonoscopy or flexible sigmoidoscopy) you may notice spotting of blood in your stool or on the toilet paper. If you underwent a bowel prep for your procedure, you may not have a normal bowel movement for a few days. ? ?Please Note:  You might notice some irritation and congestion in your nose or some drainage.  This is from the oxygen used during your procedure.  There is no need for concern and it should clear up in a day or so. ? ?SYMPTOMS TO REPORT IMMEDIATELY: ? ?Following lower endoscopy (colonoscopy): ? Excessive amounts of blood in the stool ? Significant tenderness or worsening of abdominal pains ? Swelling of the abdomen that is new, acute ? Fever of 100?F or higher ? ?For urgent or emergent issues, a gastroenterologist can be reached at any hour by calling [phone]. ?Do not use MyChart messaging for urgent concerns.  ? ? ?DIET:  We do recommend a small meal at first, but then you may proceed to your regular diet.  Drink plenty of fluids but you should avoid alcoholic beverages for 24 hours. ? ?ACTIVITY:  You should plan to take it easy for the rest of today and you should NOT DRIVE or use heavy machinery until tomorrow  (because of the sedation medicines used during the test).   ? ?FOLLOW UP: ?Our staff will call the number listed on your records 48-72 hours following your procedure to check on you and address any questions or concerns that you may have regarding the information given to you following your procedure. If we do not reach you, we will leave a message.  We will attempt to reach you two times.  During this call, we will ask if you have developed any symptoms of COVID 19. If you develop any symptoms (ie: fever, flu-like symptoms, shortness of breath, cough etc.) before then, please call [phone].  If you test positive for Covid 19 in the 2 weeks post procedure, please call and report this information to us.   ? ?If any biopsies were taken you will be contacted by phone or by letter within the next 1-3 weeks.  Please call us at [phone] if you have not heard about the biopsies in 3 weeks.  ? ? ?SIGNATURES/CONFIDENTIALITY: ?You and/or your care partner have signed paperwork which will be entered into your electronic medical record.  These signatures attest to the fact that that the information above on your After Visit Summary has been reviewed and is understood.  Full responsibility of the confidentiality of this discharge information lies with you and/or your care-partner.  ?

## 2020-08-18 NOTE — Progress Notes (Signed)
PT taken to PACU. Monitors in place. VSS. Report given to RN. 

## 2020-08-18 NOTE — Progress Notes (Signed)
A/ox3, pleased with MAC, report to RN 

## 2020-08-22 ENCOUNTER — Telehealth: Payer: Self-pay

## 2020-08-22 DIAGNOSIS — D692 Other nonthrombocytopenic purpura: Secondary | ICD-10-CM | POA: Diagnosis not present

## 2020-08-22 DIAGNOSIS — L304 Erythema intertrigo: Secondary | ICD-10-CM | POA: Diagnosis not present

## 2020-08-22 DIAGNOSIS — L909 Atrophic disorder of skin, unspecified: Secondary | ICD-10-CM | POA: Diagnosis not present

## 2020-08-22 NOTE — Telephone Encounter (Signed)
  Follow up Call-  Call back number 08/18/2020  Post procedure Call Back phone  # [phone]  Permission to leave phone message Yes  Some recent data might be hidden     Patient questions:  Do you have a fever, pain , or abdominal swelling? No. Pain Score  0 *  Have you tolerated food without any problems? Yes.    Have you been able to return to your normal activities? Yes.    Do you have any questions about your discharge instructions: Diet   No. Medications  No. Follow up visit  No.  Do you have questions or concerns about your Care? No.  Actions: * If pain score is 4 or above: No action needed, pain <4.   Have you developed a fever since your procedure? no  2.   Have you had an respiratory symptoms (SOB or cough) since your procedure? no  3.   Have you tested positive for COVID 19 since your procedure no  4.   Have you had any family members/close contacts diagnosed with the COVID 19 since your procedure?  no   If yes to any of these questions please route to Joylene John, RN and Joella Prince, RN

## 2020-08-23 ENCOUNTER — Encounter: Payer: Self-pay | Admitting: Gastroenterology

## 2020-08-23 ENCOUNTER — Ambulatory Visit (INDEPENDENT_AMBULATORY_CARE_PROVIDER_SITE_OTHER): Payer: Medicare HMO | Admitting: Gastroenterology

## 2020-08-23 VITALS — BP 152/84 | HR 92 | Ht 63.0 in | Wt 201.4 lb

## 2020-08-23 DIAGNOSIS — K582 Mixed irritable bowel syndrome: Secondary | ICD-10-CM

## 2020-08-23 DIAGNOSIS — R1084 Generalized abdominal pain: Secondary | ICD-10-CM | POA: Diagnosis not present

## 2020-08-23 MED ORDER — DICYCLOMINE HCL 10 MG PO CAPS: 10.0000 mg | ORAL_CAPSULE | Freq: Two times a day (BID) | ORAL | 1 refills | Status: DC

## 2020-08-23 NOTE — Patient Instructions (Signed)
A high fiber diet with plenty of fluids (up to 8 glasses of water daily) is suggested to relieve these symptoms.  Metamucil, 1 tablespoon once or twice daily can be used to keep bowels regular if needed.  We have sent the following medications to your pharmacy for you to pick up at your convenience: Dicyclomine 10 mg twice daily  Please call or MyChart message [place] within the next couple of weeks to let us know how you are doing. You may ask to speak to Patty P. or Vaughan Basta if you contact us via phone.  If you are age 73 or older, your body mass index should be between 23-30. Your Body mass index is 35.68 kg/m. If this is out of the aforementioned range listed, please consider follow up with your Primary Care Provider.  The [facility] providers would like to encourage you to use [facility] to communicate with providers for non-urgent requests or questions.  Due to long hold times on the telephone, sending your provider a message by [facility] may be a faster and more efficient way to get a response.  Please allow 48 business hours for a response.  Please remember that this is for non-urgent requests.   Due to recent changes in healthcare laws, you may see the results of your imaging and laboratory studies on MyChart before your provider has had a chance to review them.  We understand that in some cases there may be results that are confusing or concerning to you. Not all laboratory results come back in the same time frame and the provider may be waiting for multiple results in order to interpret others.  Please give [place] 48 hours in order for your provider to thoroughly review all the results before contacting the office for clarification of your results.

## 2020-08-23 NOTE — Progress Notes (Signed)
08/23/2020 Jillian [name] [id number] Jul 15, 1945   HISTORY OF PRESENT ILLNESS: This is a 75 year old female who is a patient of Dr. Blanch Media.  She has past medical history of anxiety, diabetes, GERD, IBS-M, chronic back pain, hypertension, hyperlipidemia, hypothyroidism.  She recently had complaints of abdominal pain and had a CT scan of the abdomen and pelvis without IV contrast on 08/16/2020.  That showed a 3.6 cm mass in the cecum that was worrisome for possible neoplastic etiology.  Otherwise she had marked distal colonic diverticulosis with possible surrounding stranding and wall thickening of the sigmoid colon that may suggest possible acute sigmoid diverticulitis.  It also showed nonobstructing right renal calculi.  Colonoscopy 08/18/2020 by Dr. Henrene Pastor showed the following:  - Diverticulosis in the left colon, with rectosigmoid stenosis. - The examination was otherwise normal on direct and retroflexion views. - No cecal mass.  She presents here today for follow-up.  She continues to report constant mid abdominal pain.  She says that her bowel habits alternate from constipation for which she uses MiraLAX to diarrhea.  She says that she tried Citrucel in the past, but that seemed to solidify and she had some issues with swallowing it.   Past Medical History:  Diagnosis Date   Allergy    Anxiety    Bowel obstruction (HCC)    Cataract    Diabetes mellitus without complication (HCC)    Diverticulitis    GERD (gastroesophageal reflux disease)    Gout    High cholesterol    Hypertension    Hypothyroidism    IBS (irritable bowel syndrome)    Neck pain    Spondylosis of lumbar spine    Past Surgical History:  Procedure Laterality Date   ABDOMINAL HYSTERECTOMY     APPENDECTOMY     CARDIAC CATHETERIZATION     x 2   CHOLECYSTECTOMY     EXPLORATORY LAPAROTOMY     HERNIA REPAIR     RADIOFREQUENCY ABLATION NERVES  03/2020   on back   TONSILLECTOMY     WRIST FRACTURE  SURGERY Right     reports that she has never smoked. She has never used smokeless tobacco. She reports current alcohol use. She reports that she does not use drugs. family history includes Colon cancer in her paternal grandmother. Allergies  Allergen Reactions   Hydrocodone-Acetaminophen Itching   Oxycodone Itching   Prednisone & Diphenhydramine Anaphylaxis    Patient indicates as of 08/23/20 that she has taken medication with no issue   Metronidazole Nausea And Vomiting    Flu like symptoms Flu like symptoms   Nortriptyline Other (See Comments)   Sulfamethoxazole Other (See Comments)    Flu-like symptoms.   Amoxicillin     Numbness   Iodinated Diagnostic Agents     rash   Luminal [Phenobarbital] Other (See Comments)    Blisters   Mobic [Meloxicam]    Penicillins     Has patient had a PCN reaction causing immediate rash, facial/tongue/throat swelling, SOB or lightheadedness with hypotension:Y Has patient had a PCN reaction causing severe rash involving mucus membranes or skin necrosis: N Has patient had a PCN reaction that required hospitalization: N Has patient had a PCN reaction occurring within the last 10 years: N If all of the above answers are "NO", then may proceed with Cephalosporin use.    Tramadol Other (See Comments)   Allopurinol Rash   Ciprofloxacin Rash   Latex Rash      Outpatient Encounter Medications  as of 08/23/2020  Medication Sig   Cholecalciferol 125 MCG (5000 UT) TABS Take 5 tablets by mouth as needed.   cyclobenzaprine (FLEXERIL) 10 MG tablet Take 10 mg by mouth 3 (three) times daily as needed for muscle spasms.   Famotidine (PEPCID PO) Take by mouth as needed.   ibuprofen (ADVIL,MOTRIN) 200 MG tablet Take 200 mg by mouth 3 (three) times daily as needed for moderate pain.   levothyroxine (SYNTHROID) 125 MCG tablet Take 125 mcg by mouth daily before breakfast.   metFORMIN (GLUCOPHAGE-XR) 500 MG 24 hr tablet Take 1,000 mg by mouth 2 (two) times daily.    polyethylene glycol powder (GLYCOLAX/MIRALAX) 17 GM/SCOOP powder Take 1 dose once to twice daily by mouth in 8 ounces of fluid (Patient taking differently: Take 1 dose once to twice daily by mouth in 8 ounces of fluid prn)   rosuvastatin (CRESTOR) 20 MG tablet Take 20 mg by mouth daily.   telmisartan (MICARDIS) 80 MG tablet Take 80 mg by mouth daily.   [DISCONTINUED] clobetasol ointment (TEMOVATE) 0.05 % Apply topically as needed.   [DISCONTINUED] levothyroxine (SYNTHROID) 137 MCG tablet Take 137 mcg by mouth daily.   [DISCONTINUED] telmisartan (MICARDIS) 40 MG tablet Take 40 mg by mouth daily.   No facility-administered encounter medications on file as of 08/23/2020.     REVIEW OF SYSTEMS  : All other systems reviewed and negative except where noted in the History of Present Illness.   PHYSICAL EXAM: BP (!) 152/84   Pulse 92   Ht 5\' 3"  (1.6 m)   Wt 201 lb 6.4 oz (91.4 kg)   BMI 35.68 kg/m  General: Well developed white female in no acute distress Head: Normocephalic and atraumatic Eyes:  Sclerae anicteric, conjunctiva pink. Ears: Normal auditory acuity Lungs: Clear throughout to auscultation; no W/R/R. Heart: Regular rate and rhythm; no M/R/G. Abdomen: Soft, non-distended.  BS present.  Diffuse mid-abdominal TTP. Musculoskeletal: Symmetrical with no gross deformities  Skin: No lesions on visible extremities Extremities: No edema  Neurological: Alert oriented x 4, grossly non-focal Psychological:  Alert and cooperative. Normal mood and affect  ASSESSMENT AND PLAN: *76 year old female with history of IBS here today with complaints of generalized abdominal pain.  She just had a CT scan of the abdomen and pelvis and a colonoscopy.  Suspect that her pain is either IBS related or possibly due to adhesions as she has been told that she has scar tissue/adhesions in the past as well.  Her pain is constant in her mid abdomen.  We will try Bentyl 10 mg twice daily for a few weeks to see if  that helps.  Prescription sent to pharmacy.  She has alternating bowel habits between constipation for which she uses MiraLAX and diarrhea.  Had tried Citrucel in the past, but she felt like that gave her some issues when swallowing it.  We discussed a trial of Benefiber beginning with 2 teaspoons mixed in 8 ounces of liquid daily and increasing that to twice daily if needed or tolerated.  She will call us or send [place] a message with an update in a few weeks.  CC:  Burnard Bunting, MD

## 2020-08-23 NOTE — Progress Notes (Signed)
Noted  

## 2020-09-07 DIAGNOSIS — Z85828 Personal history of other malignant neoplasm of skin: Secondary | ICD-10-CM | POA: Diagnosis not present

## 2020-09-07 DIAGNOSIS — L905 Scar conditions and fibrosis of skin: Secondary | ICD-10-CM | POA: Diagnosis not present

## 2020-09-15 DIAGNOSIS — M5416 Radiculopathy, lumbar region: Secondary | ICD-10-CM | POA: Diagnosis not present

## 2020-09-15 DIAGNOSIS — M792 Neuralgia and neuritis, unspecified: Secondary | ICD-10-CM | POA: Diagnosis not present

## 2020-09-15 DIAGNOSIS — M8949 Other hypertrophic osteoarthropathy, multiple sites: Secondary | ICD-10-CM | POA: Diagnosis not present

## 2020-09-21 DIAGNOSIS — Z961 Presence of intraocular lens: Secondary | ICD-10-CM | POA: Diagnosis not present

## 2020-09-21 DIAGNOSIS — H26491 Other secondary cataract, right eye: Secondary | ICD-10-CM | POA: Diagnosis not present

## 2020-09-21 DIAGNOSIS — H52203 Unspecified astigmatism, bilateral: Secondary | ICD-10-CM | POA: Diagnosis not present

## 2020-09-21 DIAGNOSIS — E119 Type 2 diabetes mellitus without complications: Secondary | ICD-10-CM | POA: Diagnosis not present

## 2020-09-26 DIAGNOSIS — Z79899 Other long term (current) drug therapy: Secondary | ICD-10-CM | POA: Diagnosis not present

## 2020-09-26 DIAGNOSIS — Z01812 Encounter for preprocedural laboratory examination: Secondary | ICD-10-CM | POA: Diagnosis not present

## 2020-09-26 DIAGNOSIS — M5416 Radiculopathy, lumbar region: Secondary | ICD-10-CM | POA: Diagnosis not present

## 2020-09-27 DIAGNOSIS — R69 Illness, unspecified: Secondary | ICD-10-CM | POA: Diagnosis not present

## 2020-09-29 DIAGNOSIS — E119 Type 2 diabetes mellitus without complications: Secondary | ICD-10-CM | POA: Diagnosis not present

## 2020-09-29 DIAGNOSIS — M792 Neuralgia and neuritis, unspecified: Secondary | ICD-10-CM | POA: Diagnosis not present

## 2020-09-29 DIAGNOSIS — Z6835 Body mass index (BMI) 35.0-35.9, adult: Secondary | ICD-10-CM | POA: Diagnosis not present

## 2020-09-29 DIAGNOSIS — E1142 Type 2 diabetes mellitus with diabetic polyneuropathy: Secondary | ICD-10-CM | POA: Diagnosis not present

## 2020-09-29 DIAGNOSIS — G4730 Sleep apnea, unspecified: Secondary | ICD-10-CM | POA: Diagnosis not present

## 2020-09-29 DIAGNOSIS — I10 Essential (primary) hypertension: Secondary | ICD-10-CM | POA: Diagnosis not present

## 2020-09-29 DIAGNOSIS — M549 Dorsalgia, unspecified: Secondary | ICD-10-CM | POA: Diagnosis not present

## 2020-09-29 DIAGNOSIS — E785 Hyperlipidemia, unspecified: Secondary | ICD-10-CM | POA: Diagnosis not present

## 2020-09-29 DIAGNOSIS — K219 Gastro-esophageal reflux disease without esophagitis: Secondary | ICD-10-CM | POA: Diagnosis not present

## 2020-09-29 DIAGNOSIS — G8929 Other chronic pain: Secondary | ICD-10-CM | POA: Diagnosis not present

## 2020-09-29 DIAGNOSIS — E669 Obesity, unspecified: Secondary | ICD-10-CM | POA: Diagnosis not present

## 2020-09-29 DIAGNOSIS — E039 Hypothyroidism, unspecified: Secondary | ICD-10-CM | POA: Diagnosis not present

## 2020-09-29 DIAGNOSIS — M5416 Radiculopathy, lumbar region: Secondary | ICD-10-CM | POA: Diagnosis not present

## 2020-09-29 DIAGNOSIS — Z9682 Presence of neurostimulator: Secondary | ICD-10-CM | POA: Diagnosis not present

## 2020-10-16 DIAGNOSIS — L304 Erythema intertrigo: Secondary | ICD-10-CM | POA: Diagnosis not present

## 2020-10-16 DIAGNOSIS — L309 Dermatitis, unspecified: Secondary | ICD-10-CM | POA: Diagnosis not present

## 2020-10-24 DIAGNOSIS — R69 Illness, unspecified: Secondary | ICD-10-CM | POA: Diagnosis not present

## 2020-11-06 DIAGNOSIS — Z85828 Personal history of other malignant neoplasm of skin: Secondary | ICD-10-CM | POA: Diagnosis not present

## 2020-11-08 DIAGNOSIS — L304 Erythema intertrigo: Secondary | ICD-10-CM | POA: Diagnosis not present

## 2020-11-08 DIAGNOSIS — N949 Unspecified condition associated with female genital organs and menstrual cycle: Secondary | ICD-10-CM | POA: Diagnosis not present

## 2020-11-08 DIAGNOSIS — L439 Lichen planus, unspecified: Secondary | ICD-10-CM | POA: Diagnosis not present

## 2020-11-08 DIAGNOSIS — Z79899 Other long term (current) drug therapy: Secondary | ICD-10-CM | POA: Diagnosis not present

## 2020-11-08 DIAGNOSIS — L900 Lichen sclerosus et atrophicus: Secondary | ICD-10-CM | POA: Diagnosis not present

## 2020-11-14 DIAGNOSIS — R69 Illness, unspecified: Secondary | ICD-10-CM | POA: Diagnosis not present

## 2020-11-14 DIAGNOSIS — F4322 Adjustment disorder with anxiety: Secondary | ICD-10-CM | POA: Diagnosis not present

## 2020-11-29 DIAGNOSIS — G8929 Other chronic pain: Secondary | ICD-10-CM | POA: Diagnosis not present

## 2020-11-29 DIAGNOSIS — Z881 Allergy status to other antibiotic agents status: Secondary | ICD-10-CM | POA: Diagnosis not present

## 2020-11-29 DIAGNOSIS — Z7984 Long term (current) use of oral hypoglycemic drugs: Secondary | ICD-10-CM | POA: Diagnosis not present

## 2020-11-29 DIAGNOSIS — Z91041 Radiographic dye allergy status: Secondary | ICD-10-CM | POA: Diagnosis not present

## 2020-11-29 DIAGNOSIS — M160 Bilateral primary osteoarthritis of hip: Secondary | ICD-10-CM | POA: Diagnosis not present

## 2020-11-29 DIAGNOSIS — M7918 Myalgia, other site: Secondary | ICD-10-CM | POA: Diagnosis not present

## 2020-11-29 DIAGNOSIS — Z91048 Other nonmedicinal substance allergy status: Secondary | ICD-10-CM | POA: Diagnosis not present

## 2020-11-29 DIAGNOSIS — M533 Sacrococcygeal disorders, not elsewhere classified: Secondary | ICD-10-CM | POA: Diagnosis not present

## 2020-11-29 DIAGNOSIS — M47818 Spondylosis without myelopathy or radiculopathy, sacral and sacrococcygeal region: Secondary | ICD-10-CM | POA: Diagnosis not present

## 2020-11-29 DIAGNOSIS — Z91040 Latex allergy status: Secondary | ICD-10-CM | POA: Diagnosis not present

## 2020-11-29 DIAGNOSIS — Z79899 Other long term (current) drug therapy: Secondary | ICD-10-CM | POA: Diagnosis not present

## 2020-12-04 DIAGNOSIS — E039 Hypothyroidism, unspecified: Secondary | ICD-10-CM | POA: Diagnosis not present

## 2020-12-06 DIAGNOSIS — M533 Sacrococcygeal disorders, not elsewhere classified: Secondary | ICD-10-CM | POA: Diagnosis not present

## 2020-12-06 DIAGNOSIS — M7918 Myalgia, other site: Secondary | ICD-10-CM | POA: Diagnosis not present

## 2020-12-13 DIAGNOSIS — F4322 Adjustment disorder with anxiety: Secondary | ICD-10-CM | POA: Diagnosis not present

## 2020-12-13 DIAGNOSIS — R69 Illness, unspecified: Secondary | ICD-10-CM | POA: Diagnosis not present

## 2021-01-24 DIAGNOSIS — Z139 Encounter for screening, unspecified: Secondary | ICD-10-CM | POA: Diagnosis not present

## 2021-01-24 DIAGNOSIS — E039 Hypothyroidism, unspecified: Secondary | ICD-10-CM | POA: Diagnosis not present

## 2021-01-24 DIAGNOSIS — L299 Pruritus, unspecified: Secondary | ICD-10-CM | POA: Diagnosis not present

## 2021-01-26 DIAGNOSIS — M47816 Spondylosis without myelopathy or radiculopathy, lumbar region: Secondary | ICD-10-CM | POA: Diagnosis not present

## 2021-02-02 DIAGNOSIS — Z1152 Encounter for screening for COVID-19: Secondary | ICD-10-CM | POA: Diagnosis not present

## 2021-02-02 DIAGNOSIS — R0981 Nasal congestion: Secondary | ICD-10-CM | POA: Diagnosis not present

## 2021-02-02 DIAGNOSIS — U071 COVID-19: Secondary | ICD-10-CM | POA: Diagnosis not present

## 2021-02-02 DIAGNOSIS — K219 Gastro-esophageal reflux disease without esophagitis: Secondary | ICD-10-CM | POA: Diagnosis not present

## 2021-02-02 DIAGNOSIS — R051 Acute cough: Secondary | ICD-10-CM | POA: Diagnosis not present

## 2021-02-02 DIAGNOSIS — J029 Acute pharyngitis, unspecified: Secondary | ICD-10-CM | POA: Diagnosis not present

## 2021-02-02 DIAGNOSIS — R5383 Other fatigue: Secondary | ICD-10-CM | POA: Diagnosis not present

## 2021-02-02 DIAGNOSIS — I10 Essential (primary) hypertension: Secondary | ICD-10-CM | POA: Diagnosis not present

## 2021-02-08 DIAGNOSIS — E039 Hypothyroidism, unspecified: Secondary | ICD-10-CM | POA: Diagnosis not present

## 2021-02-08 DIAGNOSIS — L299 Pruritus, unspecified: Secondary | ICD-10-CM | POA: Diagnosis not present

## 2021-03-15 DIAGNOSIS — M47816 Spondylosis without myelopathy or radiculopathy, lumbar region: Secondary | ICD-10-CM | POA: Diagnosis not present

## 2021-03-20 DIAGNOSIS — E669 Obesity, unspecified: Secondary | ICD-10-CM | POA: Diagnosis not present

## 2021-03-20 DIAGNOSIS — I10 Essential (primary) hypertension: Secondary | ICD-10-CM | POA: Diagnosis not present

## 2021-03-20 DIAGNOSIS — M47816 Spondylosis without myelopathy or radiculopathy, lumbar region: Secondary | ICD-10-CM | POA: Diagnosis not present

## 2021-03-29 DIAGNOSIS — M47816 Spondylosis without myelopathy or radiculopathy, lumbar region: Secondary | ICD-10-CM | POA: Diagnosis not present

## 2021-04-02 DIAGNOSIS — E1140 Type 2 diabetes mellitus with diabetic neuropathy, unspecified: Secondary | ICD-10-CM | POA: Diagnosis not present

## 2021-04-02 DIAGNOSIS — I700 Atherosclerosis of aorta: Secondary | ICD-10-CM | POA: Diagnosis not present

## 2021-04-02 DIAGNOSIS — I10 Essential (primary) hypertension: Secondary | ICD-10-CM | POA: Diagnosis not present

## 2021-04-02 DIAGNOSIS — K219 Gastro-esophageal reflux disease without esophagitis: Secondary | ICD-10-CM | POA: Diagnosis not present

## 2021-04-23 DIAGNOSIS — M7918 Myalgia, other site: Secondary | ICD-10-CM | POA: Diagnosis not present

## 2021-05-02 DIAGNOSIS — H90A22 Sensorineural hearing loss, unilateral, left ear, with restricted hearing on the contralateral side: Secondary | ICD-10-CM | POA: Diagnosis not present

## 2021-05-02 DIAGNOSIS — H6123 Impacted cerumen, bilateral: Secondary | ICD-10-CM | POA: Diagnosis not present

## 2021-05-02 DIAGNOSIS — H9202 Otalgia, left ear: Secondary | ICD-10-CM | POA: Diagnosis not present

## 2021-05-02 DIAGNOSIS — H9072 Mixed conductive and sensorineural hearing loss, unilateral, left ear, with unrestricted hearing on the contralateral side: Secondary | ICD-10-CM | POA: Diagnosis not present

## 2021-05-04 DIAGNOSIS — L900 Lichen sclerosus et atrophicus: Secondary | ICD-10-CM | POA: Diagnosis not present

## 2021-05-04 DIAGNOSIS — L304 Erythema intertrigo: Secondary | ICD-10-CM | POA: Diagnosis not present

## 2021-05-04 DIAGNOSIS — Z79899 Other long term (current) drug therapy: Secondary | ICD-10-CM | POA: Diagnosis not present

## 2021-05-09 DIAGNOSIS — M47816 Spondylosis without myelopathy or radiculopathy, lumbar region: Secondary | ICD-10-CM | POA: Diagnosis not present

## 2021-05-09 DIAGNOSIS — Z888 Allergy status to other drugs, medicaments and biological substances status: Secondary | ICD-10-CM | POA: Diagnosis not present

## 2021-05-09 DIAGNOSIS — M47817 Spondylosis without myelopathy or radiculopathy, lumbosacral region: Secondary | ICD-10-CM | POA: Diagnosis not present

## 2021-05-16 DIAGNOSIS — M25552 Pain in left hip: Secondary | ICD-10-CM | POA: Diagnosis not present

## 2021-05-28 ENCOUNTER — Other Ambulatory Visit: Payer: Self-pay | Admitting: Internal Medicine

## 2021-05-28 DIAGNOSIS — Z1231 Encounter for screening mammogram for malignant neoplasm of breast: Secondary | ICD-10-CM

## 2021-06-19 DIAGNOSIS — R0981 Nasal congestion: Secondary | ICD-10-CM | POA: Diagnosis not present

## 2021-06-19 DIAGNOSIS — J069 Acute upper respiratory infection, unspecified: Secondary | ICD-10-CM | POA: Diagnosis not present

## 2021-06-19 DIAGNOSIS — M2550 Pain in unspecified joint: Secondary | ICD-10-CM | POA: Diagnosis not present

## 2021-06-19 DIAGNOSIS — R5383 Other fatigue: Secondary | ICD-10-CM | POA: Diagnosis not present

## 2021-07-13 IMAGING — MG DIGITAL SCREENING BILAT W/ TOMO W/ CAD
8 series · 8 of 24 positions shown · non-contrast
Comparison: Previous exam(s).

CLINICAL DATA: Screening.

EXAM:
DIGITAL SCREENING BILATERAL MAMMOGRAM WITH TOMO AND CAD

[R MLO synth-2D]
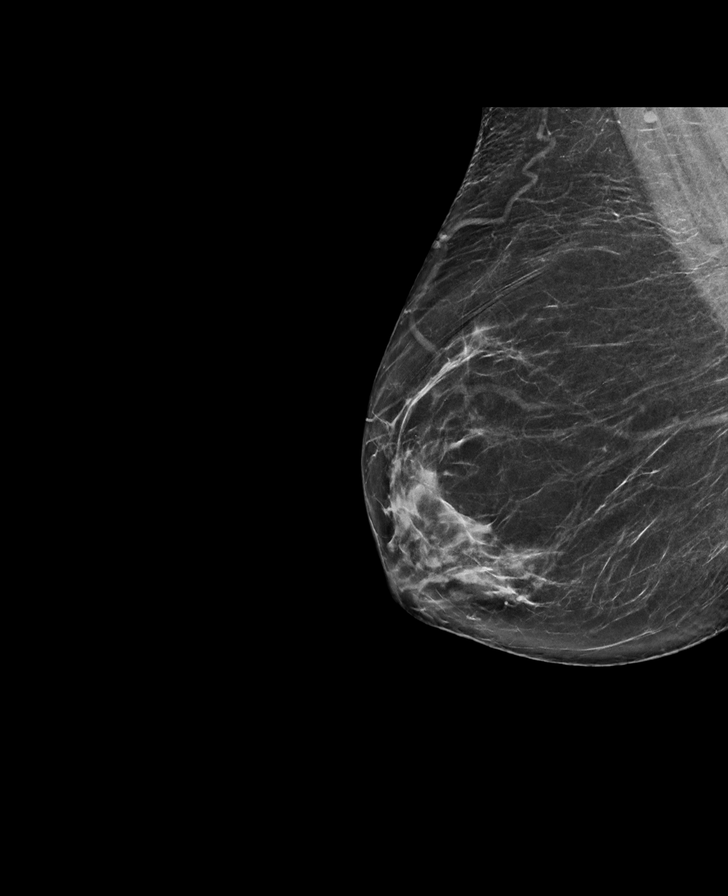

[R CC synth-2D]
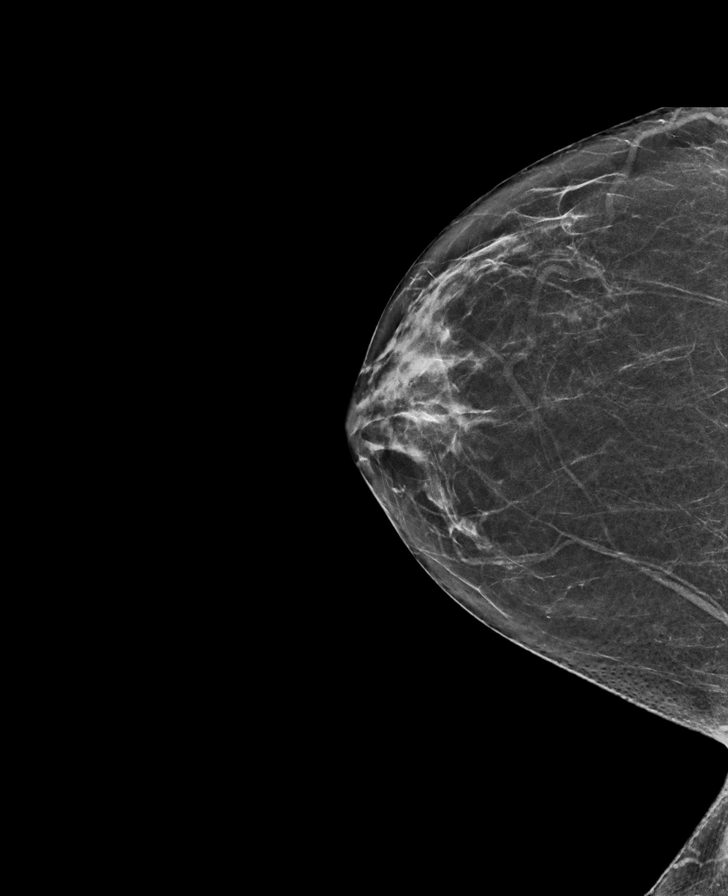

[L CC synth-2D]
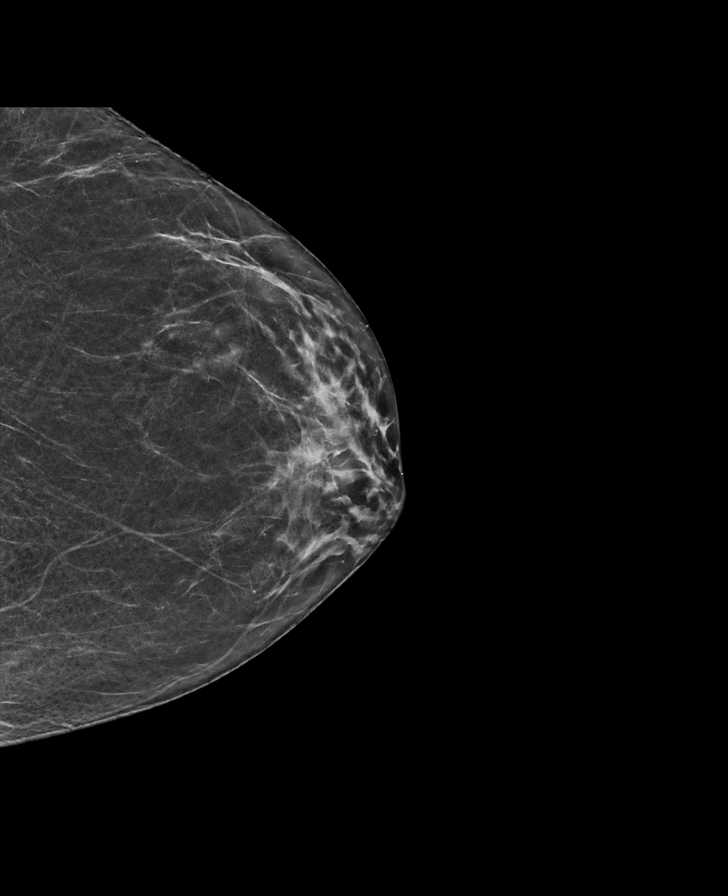

[L MLO synth-2D]
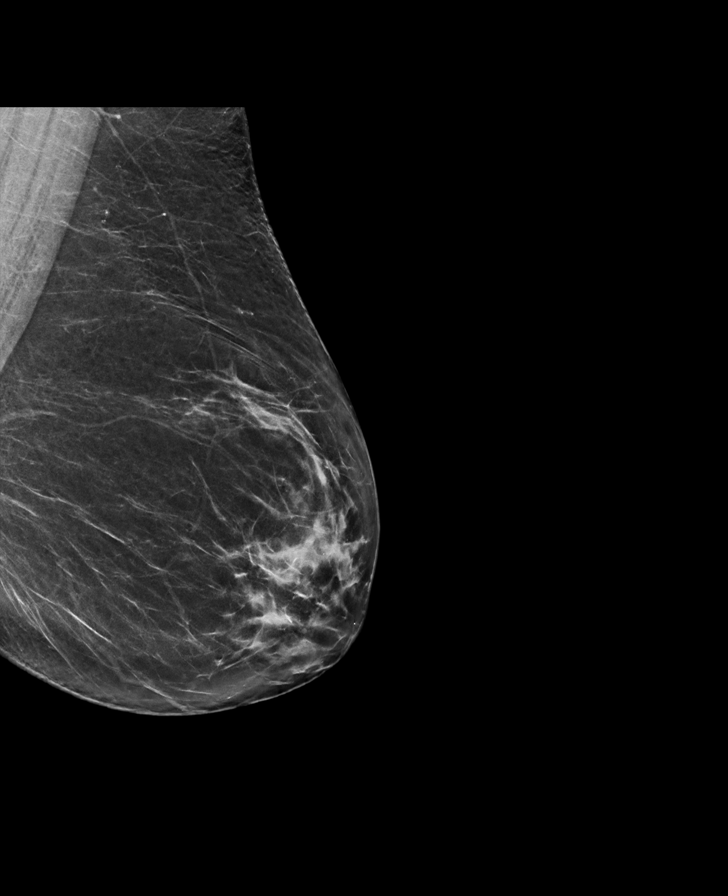

[L CC tomo · tomo slice 29/56.0]
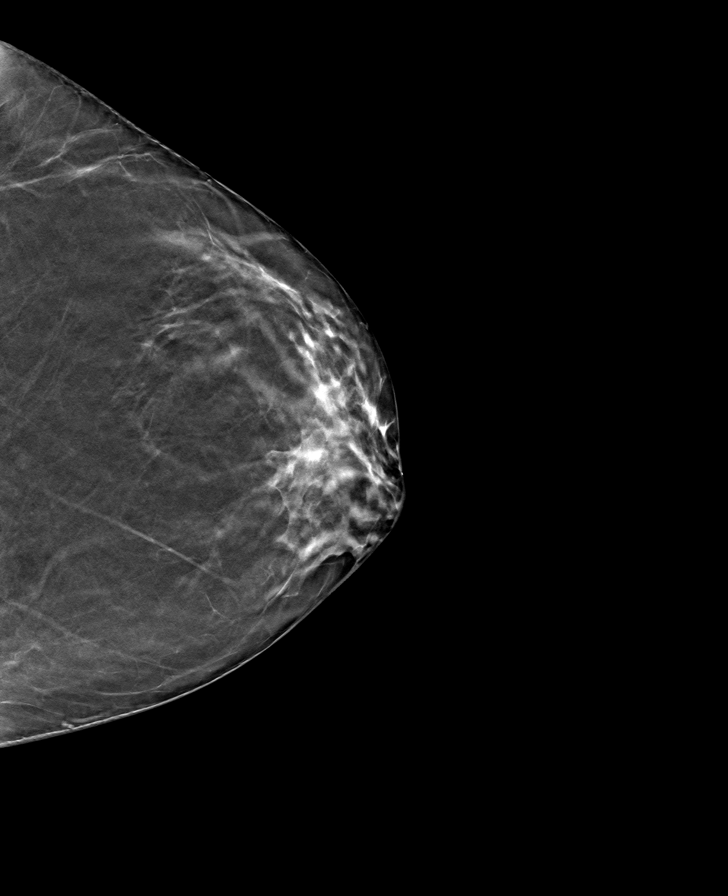

[L MLO tomo · tomo slice 37/72.0]
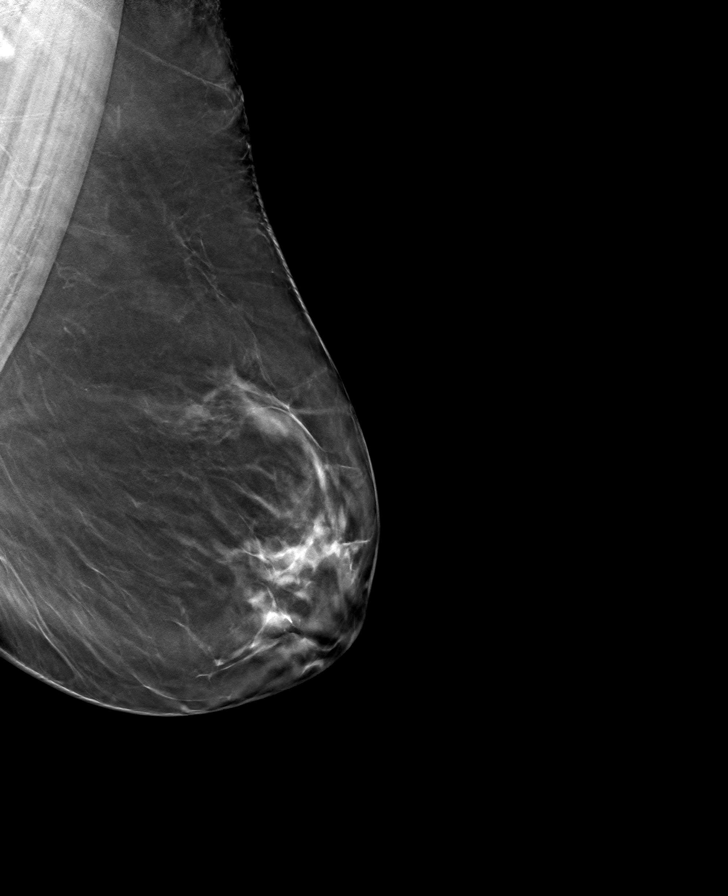

[R CC tomo · tomo slice 30/59.0]
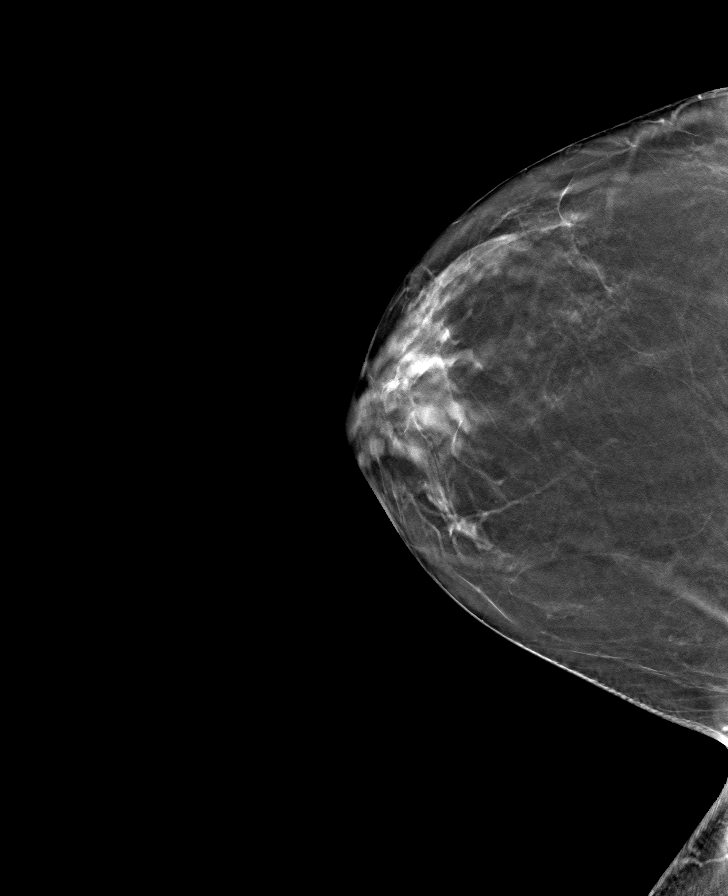

[R MLO tomo · tomo slice 35/69.0]
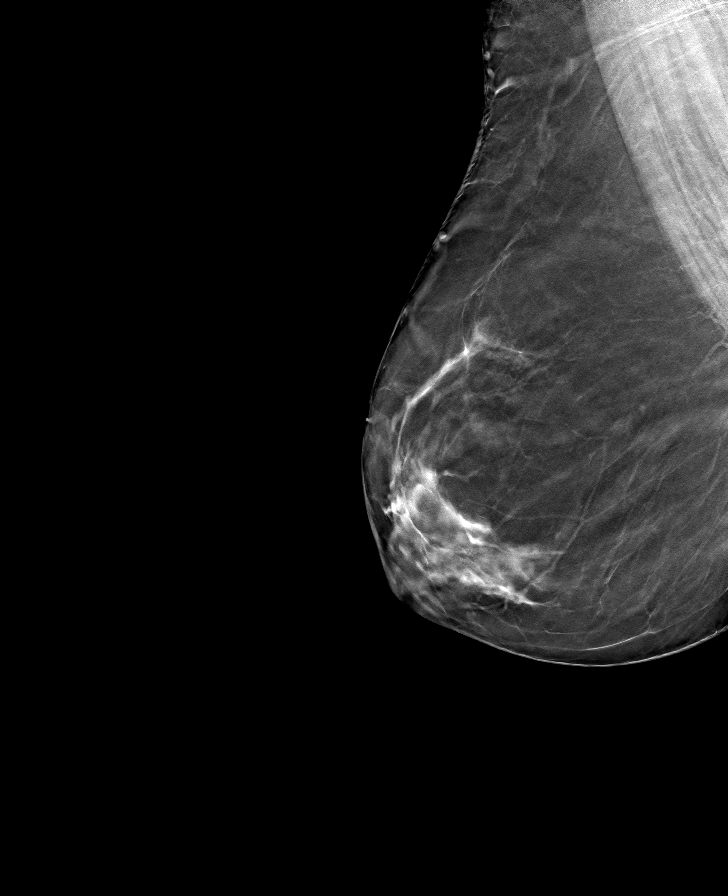

[8 of 24 positions shown; findings below may reference images not displayed]

ACR Breast Density Category c: The breast tissue is heterogeneously
dense, which may obscure small masses.
FINDINGS: There are no findings suspicious for malignancy. Images were
processed with CAD.
IMPRESSION: No mammographic evidence of malignancy. A result letter of this
screening mammogram will be mailed directly to the patient.

RECOMMENDATION:
Screening mammogram in one year. (Code:[id number])

BI-RADS CATEGORY  1: Negative.

## 2021-07-17 ENCOUNTER — Ambulatory Visit
Admission: RE | Admit: 2021-07-17 | Discharge: 2021-07-17 | Disposition: A | Discharge status: Home / Self Care | Payer: Medicare Other | Source: Ambulatory Visit | Attending: Internal Medicine | Admitting: Internal Medicine

## 2021-07-17 DIAGNOSIS — Z1231 Encounter for screening mammogram for malignant neoplasm of breast: Secondary | ICD-10-CM | POA: Diagnosis not present

## 2021-07-18 ENCOUNTER — Ambulatory Visit: Payer: Medicare HMO

## 2021-07-19 DIAGNOSIS — R300 Dysuria: Secondary | ICD-10-CM | POA: Diagnosis not present

## 2021-07-19 DIAGNOSIS — K589 Irritable bowel syndrome without diarrhea: Secondary | ICD-10-CM | POA: Diagnosis not present

## 2021-07-19 DIAGNOSIS — M47816 Spondylosis without myelopathy or radiculopathy, lumbar region: Secondary | ICD-10-CM | POA: Diagnosis not present

## 2021-07-20 DIAGNOSIS — M47817 Spondylosis without myelopathy or radiculopathy, lumbosacral region: Secondary | ICD-10-CM | POA: Diagnosis not present

## 2021-07-20 DIAGNOSIS — M479 Spondylosis, unspecified: Secondary | ICD-10-CM | POA: Diagnosis not present

## 2021-07-25 DIAGNOSIS — E1140 Type 2 diabetes mellitus with diabetic neuropathy, unspecified: Secondary | ICD-10-CM | POA: Diagnosis not present

## 2021-07-25 DIAGNOSIS — E785 Hyperlipidemia, unspecified: Secondary | ICD-10-CM | POA: Diagnosis not present

## 2021-07-25 DIAGNOSIS — I10 Essential (primary) hypertension: Secondary | ICD-10-CM | POA: Diagnosis not present

## 2021-07-25 DIAGNOSIS — E039 Hypothyroidism, unspecified: Secondary | ICD-10-CM | POA: Diagnosis not present

## 2021-08-22 DIAGNOSIS — M5442 Lumbago with sciatica, left side: Secondary | ICD-10-CM | POA: Diagnosis not present

## 2021-08-22 DIAGNOSIS — G8929 Other chronic pain: Secondary | ICD-10-CM | POA: Diagnosis not present

## 2021-08-22 DIAGNOSIS — M5441 Lumbago with sciatica, right side: Secondary | ICD-10-CM | POA: Diagnosis not present

## 2021-08-24 DIAGNOSIS — E1140 Type 2 diabetes mellitus with diabetic neuropathy, unspecified: Secondary | ICD-10-CM | POA: Diagnosis not present

## 2021-08-24 DIAGNOSIS — E039 Hypothyroidism, unspecified: Secondary | ICD-10-CM | POA: Diagnosis not present

## 2021-08-24 DIAGNOSIS — E785 Hyperlipidemia, unspecified: Secondary | ICD-10-CM | POA: Diagnosis not present

## 2021-08-24 DIAGNOSIS — I10 Essential (primary) hypertension: Secondary | ICD-10-CM | POA: Diagnosis not present

## 2021-08-31 DIAGNOSIS — N94819 Vulvodynia, unspecified: Secondary | ICD-10-CM | POA: Diagnosis not present

## 2021-08-31 DIAGNOSIS — L900 Lichen sclerosus et atrophicus: Secondary | ICD-10-CM | POA: Diagnosis not present

## 2021-09-04 DIAGNOSIS — M4807 Spinal stenosis, lumbosacral region: Secondary | ICD-10-CM | POA: Diagnosis not present

## 2021-09-04 DIAGNOSIS — M48061 Spinal stenosis, lumbar region without neurogenic claudication: Secondary | ICD-10-CM | POA: Diagnosis not present

## 2021-09-04 DIAGNOSIS — M5441 Lumbago with sciatica, right side: Secondary | ICD-10-CM | POA: Diagnosis not present

## 2021-09-04 DIAGNOSIS — M47816 Spondylosis without myelopathy or radiculopathy, lumbar region: Secondary | ICD-10-CM | POA: Diagnosis not present

## 2021-09-04 DIAGNOSIS — G8929 Other chronic pain: Secondary | ICD-10-CM | POA: Diagnosis not present

## 2021-09-12 DIAGNOSIS — G8929 Other chronic pain: Secondary | ICD-10-CM | POA: Diagnosis not present

## 2021-09-12 DIAGNOSIS — M533 Sacrococcygeal disorders, not elsewhere classified: Secondary | ICD-10-CM | POA: Diagnosis not present

## 2021-09-12 DIAGNOSIS — M5431 Sciatica, right side: Secondary | ICD-10-CM | POA: Diagnosis not present

## 2021-09-27 DIAGNOSIS — Z91041 Radiographic dye allergy status: Secondary | ICD-10-CM | POA: Diagnosis not present

## 2021-09-27 DIAGNOSIS — M5431 Sciatica, right side: Secondary | ICD-10-CM | POA: Diagnosis not present

## 2021-10-01 DIAGNOSIS — E039 Hypothyroidism, unspecified: Secondary | ICD-10-CM | POA: Diagnosis not present

## 2021-10-01 DIAGNOSIS — I10 Essential (primary) hypertension: Secondary | ICD-10-CM | POA: Diagnosis not present

## 2021-10-01 DIAGNOSIS — M109 Gout, unspecified: Secondary | ICD-10-CM | POA: Diagnosis not present

## 2021-10-01 DIAGNOSIS — E785 Hyperlipidemia, unspecified: Secondary | ICD-10-CM | POA: Diagnosis not present

## 2021-10-08 DIAGNOSIS — I10 Essential (primary) hypertension: Secondary | ICD-10-CM | POA: Diagnosis not present

## 2021-10-08 DIAGNOSIS — E1140 Type 2 diabetes mellitus with diabetic neuropathy, unspecified: Secondary | ICD-10-CM | POA: Diagnosis not present

## 2021-10-08 DIAGNOSIS — E039 Hypothyroidism, unspecified: Secondary | ICD-10-CM | POA: Diagnosis not present

## 2021-10-08 DIAGNOSIS — R82998 Other abnormal findings in urine: Secondary | ICD-10-CM | POA: Diagnosis not present

## 2021-10-08 DIAGNOSIS — Z0000 Encounter for general adult medical examination without abnormal findings: Secondary | ICD-10-CM | POA: Diagnosis not present

## 2021-10-15 DIAGNOSIS — Z91041 Radiographic dye allergy status: Secondary | ICD-10-CM | POA: Diagnosis not present

## 2021-10-15 DIAGNOSIS — M5431 Sciatica, right side: Secondary | ICD-10-CM | POA: Diagnosis not present

## 2021-10-15 DIAGNOSIS — M4727 Other spondylosis with radiculopathy, lumbosacral region: Secondary | ICD-10-CM | POA: Diagnosis not present

## 2021-10-15 DIAGNOSIS — M532X8 Spinal instabilities, sacral and sacrococcygeal region: Secondary | ICD-10-CM | POA: Diagnosis not present

## 2021-10-15 DIAGNOSIS — M48061 Spinal stenosis, lumbar region without neurogenic claudication: Secondary | ICD-10-CM | POA: Diagnosis not present

## 2021-10-15 DIAGNOSIS — M1612 Unilateral primary osteoarthritis, left hip: Secondary | ICD-10-CM | POA: Diagnosis not present

## 2021-10-15 DIAGNOSIS — G8929 Other chronic pain: Secondary | ICD-10-CM | POA: Diagnosis not present

## 2021-11-19 DIAGNOSIS — L299 Pruritus, unspecified: Secondary | ICD-10-CM | POA: Diagnosis not present

## 2021-11-19 DIAGNOSIS — E039 Hypothyroidism, unspecified: Secondary | ICD-10-CM | POA: Diagnosis not present

## 2021-11-29 DIAGNOSIS — K589 Irritable bowel syndrome without diarrhea: Secondary | ICD-10-CM | POA: Diagnosis not present

## 2021-11-29 DIAGNOSIS — E669 Obesity, unspecified: Secondary | ICD-10-CM | POA: Diagnosis not present

## 2021-11-29 DIAGNOSIS — E1140 Type 2 diabetes mellitus with diabetic neuropathy, unspecified: Secondary | ICD-10-CM | POA: Diagnosis not present

## 2021-11-29 DIAGNOSIS — K219 Gastro-esophageal reflux disease without esophagitis: Secondary | ICD-10-CM | POA: Diagnosis not present

## 2021-12-31 DIAGNOSIS — R600 Localized edema: Secondary | ICD-10-CM | POA: Diagnosis not present

## 2021-12-31 DIAGNOSIS — M47816 Spondylosis without myelopathy or radiculopathy, lumbar region: Secondary | ICD-10-CM | POA: Diagnosis not present

## 2022-01-09 ENCOUNTER — Other Ambulatory Visit: Payer: Self-pay | Admitting: Family Medicine

## 2022-01-09 DIAGNOSIS — R600 Localized edema: Secondary | ICD-10-CM

## 2022-01-14 ENCOUNTER — Ambulatory Visit: Payer: Medicare Other

## 2022-01-14 DIAGNOSIS — I10 Essential (primary) hypertension: Secondary | ICD-10-CM | POA: Diagnosis not present

## 2022-01-14 DIAGNOSIS — R600 Localized edema: Secondary | ICD-10-CM | POA: Diagnosis not present

## 2022-01-21 DIAGNOSIS — R5383 Other fatigue: Secondary | ICD-10-CM | POA: Diagnosis not present

## 2022-01-21 DIAGNOSIS — J029 Acute pharyngitis, unspecified: Secondary | ICD-10-CM | POA: Diagnosis not present

## 2022-01-21 DIAGNOSIS — R059 Cough, unspecified: Secondary | ICD-10-CM | POA: Diagnosis not present

## 2022-01-21 DIAGNOSIS — J0100 Acute maxillary sinusitis, unspecified: Secondary | ICD-10-CM | POA: Diagnosis not present

## 2022-03-18 ENCOUNTER — Other Ambulatory Visit: Payer: Self-pay | Admitting: Internal Medicine

## 2022-03-18 DIAGNOSIS — R234 Changes in skin texture: Secondary | ICD-10-CM

## 2022-03-18 DIAGNOSIS — E1169 Type 2 diabetes mellitus with other specified complication: Secondary | ICD-10-CM | POA: Diagnosis not present

## 2022-03-18 DIAGNOSIS — Z23 Encounter for immunization: Secondary | ICD-10-CM | POA: Diagnosis not present

## 2022-03-18 DIAGNOSIS — N6489 Other specified disorders of breast: Secondary | ICD-10-CM

## 2022-03-18 DIAGNOSIS — Z1231 Encounter for screening mammogram for malignant neoplasm of breast: Secondary | ICD-10-CM

## 2022-03-18 DIAGNOSIS — E1140 Type 2 diabetes mellitus with diabetic neuropathy, unspecified: Secondary | ICD-10-CM | POA: Diagnosis not present

## 2022-03-18 DIAGNOSIS — M47816 Spondylosis without myelopathy or radiculopathy, lumbar region: Secondary | ICD-10-CM | POA: Diagnosis not present

## 2022-04-11 ENCOUNTER — Ambulatory Visit
Admission: RE | Admit: 2022-04-11 | Discharge: 2022-04-11 | Disposition: A | Discharge status: Home / Self Care | Payer: Medicare Other | Source: Ambulatory Visit | Attending: Internal Medicine | Admitting: Internal Medicine

## 2022-04-11 ENCOUNTER — Ambulatory Visit: Payer: Medicare Other

## 2022-04-11 DIAGNOSIS — R234 Changes in skin texture: Secondary | ICD-10-CM

## 2022-04-11 DIAGNOSIS — R922 Inconclusive mammogram: Secondary | ICD-10-CM | POA: Diagnosis not present

## 2022-04-15 DIAGNOSIS — E669 Obesity, unspecified: Secondary | ICD-10-CM | POA: Diagnosis not present

## 2022-04-15 DIAGNOSIS — K219 Gastro-esophageal reflux disease without esophagitis: Secondary | ICD-10-CM | POA: Diagnosis not present

## 2022-04-15 DIAGNOSIS — I10 Essential (primary) hypertension: Secondary | ICD-10-CM | POA: Diagnosis not present

## 2022-04-15 DIAGNOSIS — E1140 Type 2 diabetes mellitus with diabetic neuropathy, unspecified: Secondary | ICD-10-CM | POA: Diagnosis not present

## 2022-04-19 DIAGNOSIS — L900 Lichen sclerosus et atrophicus: Secondary | ICD-10-CM | POA: Diagnosis not present

## 2022-04-19 DIAGNOSIS — N904 Leukoplakia of vulva: Secondary | ICD-10-CM | POA: Diagnosis not present

## 2022-04-25 ENCOUNTER — Ambulatory Visit: Payer: Medicare Other | Admitting: Gastroenterology

## 2022-05-03 ENCOUNTER — Other Ambulatory Visit: Payer: Medicare Other

## 2022-05-07 DIAGNOSIS — E039 Hypothyroidism, unspecified: Secondary | ICD-10-CM | POA: Diagnosis not present

## 2022-05-07 DIAGNOSIS — L299 Pruritus, unspecified: Secondary | ICD-10-CM | POA: Diagnosis not present

## 2022-05-14 DIAGNOSIS — K219 Gastro-esophageal reflux disease without esophagitis: Secondary | ICD-10-CM | POA: Diagnosis not present

## 2022-05-14 DIAGNOSIS — E1140 Type 2 diabetes mellitus with diabetic neuropathy, unspecified: Secondary | ICD-10-CM | POA: Diagnosis not present

## 2022-05-14 DIAGNOSIS — R350 Frequency of micturition: Secondary | ICD-10-CM | POA: Diagnosis not present

## 2022-05-14 DIAGNOSIS — R82998 Other abnormal findings in urine: Secondary | ICD-10-CM | POA: Diagnosis not present

## 2022-05-14 DIAGNOSIS — I10 Essential (primary) hypertension: Secondary | ICD-10-CM | POA: Diagnosis not present

## 2022-05-28 DIAGNOSIS — R1084 Generalized abdominal pain: Secondary | ICD-10-CM | POA: Diagnosis not present

## 2022-05-28 DIAGNOSIS — E1140 Type 2 diabetes mellitus with diabetic neuropathy, unspecified: Secondary | ICD-10-CM | POA: Diagnosis not present

## 2022-05-28 DIAGNOSIS — K219 Gastro-esophageal reflux disease without esophagitis: Secondary | ICD-10-CM | POA: Diagnosis not present

## 2022-05-28 DIAGNOSIS — I10 Essential (primary) hypertension: Secondary | ICD-10-CM | POA: Diagnosis not present

## 2022-06-05 DIAGNOSIS — K080 Exfoliation of teeth due to systemic causes: Secondary | ICD-10-CM | POA: Diagnosis not present

## 2022-06-26 DIAGNOSIS — M4186 Other forms of scoliosis, lumbar region: Secondary | ICD-10-CM | POA: Diagnosis not present

## 2022-06-26 DIAGNOSIS — M160 Bilateral primary osteoarthritis of hip: Secondary | ICD-10-CM | POA: Diagnosis not present

## 2022-06-26 DIAGNOSIS — M533 Sacrococcygeal disorders, not elsewhere classified: Secondary | ICD-10-CM | POA: Diagnosis not present

## 2022-06-27 ENCOUNTER — Encounter: Payer: Self-pay | Admitting: Gastroenterology

## 2022-06-27 ENCOUNTER — Ambulatory Visit: Payer: Medicare Other | Admitting: Gastroenterology

## 2022-06-27 VITALS — BP 138/86 | HR 94 | Ht 63.0 in | Wt 201.0 lb

## 2022-06-27 DIAGNOSIS — R194 Change in bowel habit: Secondary | ICD-10-CM | POA: Insufficient documentation

## 2022-06-27 DIAGNOSIS — R1084 Generalized abdominal pain: Secondary | ICD-10-CM | POA: Diagnosis not present

## 2022-06-27 DIAGNOSIS — K5900 Constipation, unspecified: Secondary | ICD-10-CM

## 2022-06-27 DIAGNOSIS — R110 Nausea: Secondary | ICD-10-CM

## 2022-06-27 MED ORDER — LINACLOTIDE 72 MCG PO CAPS: 72.0000 ug | ORAL_CAPSULE | Freq: Every day | ORAL | 2 refills | Status: DC

## 2022-06-27 MED ORDER — HYOSCYAMINE SULFATE 0.125 MG SL SUBL: 0.1250 mg | SUBLINGUAL_TABLET | Freq: Three times a day (TID) | SUBLINGUAL | 1 refills | Status: DC | PRN

## 2022-06-27 NOTE — Patient Instructions (Addendum)
  BOWEL PURGE:   I am recommending a bowel purge to aid in cleaning out your bowels.    OVER THE COUNTER SHOPPING GUIDE:   Purchase 1 (one) 119 GRAM bottle of Miralax Purchase 1 (one) 32 ounce bottle of Gatorade   STEPS:   Mix the entire bottle of Miralax in the 32 ounces of Gatorade and stir to dissolve completely. Drink the Miralax solution you have prepared. You will drink this mixture over the next 2-3 hours.  You should expect results within 1 to 6 hours after completing this bowel purge.  We have sent the following medications to your pharmacy for you to pick up at your convenience: Linzess 72 mcg daily before breakfast.  Levsin 0.125 mg every 8 hours as needed.      _______________________________________________________  If your blood pressure at your visit was 140/90 or greater, please contact your primary care physician to follow up on this.  _______________________________________________________  If you are age 8 or older, your body mass index should be between 23-30. Your Body mass index is 35.61 kg/m. If this is out of the aforementioned range listed, please consider follow up with your Primary Care Provider.  If you are age 49 or younger, your body mass index should be between 19-25. Your Body mass index is 35.61 kg/m. If this is out of the aformentioned range listed, please consider follow up with your Primary Care Provider.   ________________________________________________________  The [facility] providers would like to encourage you to use [facility] to communicate with providers for non-urgent requests or questions.  Due to long hold times on the telephone, sending your provider a message by [facility] may be a faster and more efficient way to get a response.  Please allow 48 business hours for a response.  Please remember that this is for non-urgent requests.  _______________________________________________________

## 2022-06-27 NOTE — Progress Notes (Signed)
06/27/2022 Jillian [name] [id number] [birth year]   HISTORY OF PRESENT ILLNESS:  This is a 77 year old female who is a patient of Dr. Lamar Sprinkles with history of IBS here with complaints of constipation, generalized abdominal pain and bloating, as well as nausea and sensation of early satiety/fullness after eating, not able to eat as much at a time.  Says that she always feels bloated.  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  She asked about Linzess as the Miralax has not been working great, having to use some MOM as well.  She likely also has adhesions/scar tissue may be contributing to her pain and symptoms.  She started omeprazole 40 mg daily that was given to her by her PCP and that has seemed to help as well.  CT scan in 2022 was without contrast.  Has issues with swallowing Citrucel.  Last seen here by me on 08/23/2020 following her colonoscopy.   Colonoscopy 07/2020: Diverticulosis with rectosigmoid stenosis.  Past Medical History:  Diagnosis Date   Allergy    Anxiety    Bowel obstruction (HCC)    Cataract    Diabetes mellitus without complication (HCC)    Diverticulitis    GERD (gastroesophageal reflux disease)    Gout    High cholesterol    Hypertension    Hypothyroidism    IBS (irritable bowel syndrome)    Neck pain    Spondylosis of lumbar spine    Past Surgical History:  Procedure Laterality Date   ABDOMINAL HYSTERECTOMY     APPENDECTOMY     BREAST BIOPSY     unsure which breast   CARDIAC CATHETERIZATION     x 2   CHOLECYSTECTOMY     EXPLORATORY LAPAROTOMY     HERNIA REPAIR     RADIOFREQUENCY ABLATION NERVES  03/2020   on back   TONSILLECTOMY     WRIST FRACTURE SURGERY Right     reports that she has never smoked. She has never used smokeless tobacco. She reports current alcohol use. She reports that she does not use drugs. family history includes Breast cancer in her maternal aunt; Colon cancer in her paternal  grandmother. Allergies  Allergen Reactions   Hydrocodone-Acetaminophen Itching   Oxycodone Itching   Prednisone & Diphenhydramine Anaphylaxis    Patient indicates as of 08/23/20 that she has taken medication with no issue   Metronidazole Nausea And Vomiting    Flu like symptoms Flu like symptoms   Nortriptyline Other (See Comments)   Sulfamethoxazole Other (See Comments)    Flu-like symptoms.   Amoxicillin     Numbness   Iodinated Contrast Media     rash   Luminal [Phenobarbital] Other (See Comments)    Blisters   Mobic [Meloxicam]    Penicillins     Has patient had a PCN reaction causing immediate rash, facial/tongue/throat swelling, SOB or lightheadedness with hypotension:Y Has patient had a PCN reaction causing severe rash involving mucus membranes or skin necrosis: N Has patient had a PCN reaction that required hospitalization: N Has patient had a PCN reaction occurring within the last 10 years: N If all of the above answers are "NO", then may proceed with Cephalosporin use.    Tramadol Other (See Comments)   Allopurinol Rash   Ciprofloxacin Rash   Latex Rash      Outpatient Encounter Medications as of 06/27/2022  Medication Sig   cyclobenzaprine (FLEXERIL) 10 MG tablet Take 10 mg by mouth 3 (three)  times daily as needed for muscle spasms.   Famotidine (PEPCID PO) Take by mouth as needed.   ibuprofen (ADVIL,MOTRIN) 200 MG tablet Take 200 mg by mouth 3 (three) times daily as needed for moderate pain.   metFORMIN (GLUCOPHAGE-XR) 500 MG 24 hr tablet Take 1,000 mg by mouth 2 (two) times daily.   polyethylene glycol powder (GLYCOLAX/MIRALAX) 17 GM/SCOOP powder Take 1 dose once to twice daily by mouth in 8 ounces of fluid (Patient taking differently: Take 1 dose once to twice daily by mouth in 8 ounces of fluid prn)   rosuvastatin (CRESTOR) 20 MG tablet Take 20 mg by mouth daily.   [DISCONTINUED] telmisartan (MICARDIS) 80 MG tablet Take 80 mg by mouth daily.   Cholecalciferol  125 MCG (5000 UT) TABS Take 5 tablets by mouth as needed. (Patient not taking: Reported on 06/27/2022)   dicyclomine (BENTYL) 10 MG capsule Take 1 capsule (10 mg total) by mouth in the morning and at bedtime. (Patient not taking: Reported on 06/27/2022)   levothyroxine (SYNTHROID) 125 MCG tablet Take 125 mcg by mouth daily before breakfast.   telmisartan (MICARDIS) 80 MG tablet Take 80 mg by mouth daily.   No facility-administered encounter medications on file as of 06/27/2022.     REVIEW OF SYSTEMS  : All other systems reviewed and negative except where noted in the History of Present Illness.   PHYSICAL EXAM: BP 138/86   Pulse 94   Ht 5\' 3"  (1.6 m)   Wt 201 lb (91.2 kg)   BMI 35.61 kg/m  General: Well developed white female in no acute distress Head: Normocephalic and atraumatic Eyes:  Sclerae anicteric, conjunctiva pink. Ears: Normal auditory acuity Abdomen: Soft, non-distended.  BS present.  Some diffuse TTP>in the LLQ. Musculoskeletal: Symmetrical with no gross deformities  Skin: No lesions on visible extremities Extremities: No edema  Neurological: Alert oriented x 4, grossly non-focal Psychological:  Alert and cooperative. Normal mood and affect  ASSESSMENT AND PLAN: 77 year old female with history of IBS here with complaints of constipation, generalized abdominal pain and bloating, as well as nausea and sensation of early satiety/fullness.  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  We need to make sure were treating her constipation appropriately.  She asked about Linzess I think that is appropriate to try as the Miralax has not been working great.  Will start with just 72 mcg daily and can titrate up if needed.  Samples given and prescription sent to pharmacy.  Will have her do MiraLAX purge as well.  She likely also has adhesions/scar tissue may be contributing to her pain and symptoms.  She started omeprazole 40 mg daily that was given to her  by her PCP and that has seemed to help as well.  Will try some Levsin for spasm and cramping.  Prescription sent to pharmacy.  We discussed repeating a CT scan with contrast as the one that she had in 2022 was without contrast.  She would need to be premedicated if we do decide to proceed as she had a rash with iodinated contrast previously.  She may consider that, but wants to try these things first.  Will get her follow-up with me in about 8 weeks.  She will certainly contact us back in the interim if things worsen.   CC:  Geoffry Paradise, MD

## 2022-07-02 DIAGNOSIS — M5441 Lumbago with sciatica, right side: Secondary | ICD-10-CM | POA: Diagnosis not present

## 2022-07-02 DIAGNOSIS — M160 Bilateral primary osteoarthritis of hip: Secondary | ICD-10-CM | POA: Diagnosis not present

## 2022-07-04 ENCOUNTER — Other Ambulatory Visit: Payer: Self-pay | Admitting: Orthopedic Surgery

## 2022-07-04 DIAGNOSIS — M545 Low back pain, unspecified: Secondary | ICD-10-CM

## 2022-07-08 NOTE — Progress Notes (Signed)
Noted  

## 2022-07-09 NOTE — Telephone Encounter (Signed)
Jillian [name] see the message from the pt.  She is on 72 mcg of Linzess.

## 2022-07-12 ENCOUNTER — Ambulatory Visit
Admission: RE | Admit: 2022-07-12 | Discharge: 2022-07-12 | Disposition: A | Discharge status: Home / Self Care | Payer: Medicare Other | Source: Ambulatory Visit | Attending: Orthopedic Surgery | Admitting: Orthopedic Surgery

## 2022-07-12 DIAGNOSIS — M9963 Osseous and subluxation stenosis of intervertebral foramina of lumbar region: Secondary | ICD-10-CM | POA: Diagnosis not present

## 2022-07-12 DIAGNOSIS — M545 Low back pain, unspecified: Secondary | ICD-10-CM | POA: Diagnosis not present

## 2022-07-12 DIAGNOSIS — M5416 Radiculopathy, lumbar region: Secondary | ICD-10-CM | POA: Diagnosis not present

## 2022-07-16 DIAGNOSIS — M160 Bilateral primary osteoarthritis of hip: Secondary | ICD-10-CM | POA: Diagnosis not present

## 2022-08-07 ENCOUNTER — Telehealth: Payer: Self-pay

## 2022-08-07 ENCOUNTER — Encounter: Payer: Self-pay | Admitting: Gastroenterology

## 2022-08-07 ENCOUNTER — Ambulatory Visit: Payer: Medicare Other | Admitting: Gastroenterology

## 2022-08-07 VITALS — BP 110/64 | HR 108 | Ht 63.0 in | Wt 194.0 lb

## 2022-08-07 DIAGNOSIS — R194 Change in bowel habit: Secondary | ICD-10-CM | POA: Diagnosis not present

## 2022-08-07 DIAGNOSIS — R140 Abdominal distension (gaseous): Secondary | ICD-10-CM

## 2022-08-07 DIAGNOSIS — R1084 Generalized abdominal pain: Secondary | ICD-10-CM | POA: Diagnosis not present

## 2022-08-07 MED ORDER — PREDNISONE 50 MG PO TABS: ORAL_TABLET | ORAL | 0 refills | Status: DC

## 2022-08-07 MED ORDER — DIPHENHYDRAMINE HCL 50 MG PO TABS: ORAL_TABLET | ORAL | 0 refills | Status: DC

## 2022-08-07 NOTE — Progress Notes (Signed)
Phone call to patient to review instructions for 13 hr prep for CT w/ contrast on 08/12/22  at 08:50 am. Prescription called into [facility]. Pt aware and verbalized understanding of instructions.   Prescription: Pt to take 50 mg of prednisone on 08/11/22 at 8:50 pm, 50 mg of prednisone on 08/12/22 at 2:50 am, and 50 mg of prednisone on 08/12/22 at 8:50 am. Pt is also to take 50 mg of benadryl on 08/12/22 at 8:50 am. Please call [phone] with any questions before Saturday.

## 2022-08-07 NOTE — Progress Notes (Signed)
08/07/2022 Roselee Itzae Morgon [id number] 1945-10-26   HISTORY OF PRESENT ILLNESS: This is a 77 year old female who is here for follow-up of her constipation, abdominal pain, bloating.  She was seen by me on 06/27/2022.  I placed her on Linzess 72 mcg daily, but even with this dosing it was way too strong for her and she would have liquid stool running down her legs without even knowing.  Now she is using MiraLAX, 1-1/2 capfuls daily and seems to be doing pretty well with that.  She still complains of a lot of abdominal bloating and fullness after eating as well as excessive gas.  Colonoscopy Eurydice 2022 showed only diverticulosis with rectosigmoid stenosis.   Past Medical History:  Diagnosis Date   Allergy    Anxiety    Bowel obstruction (HCC)    Cataract    Diabetes mellitus without complication (HCC)    Diverticulitis    GERD (gastroesophageal reflux disease)    Gout    High cholesterol    Hypertension    Hypothyroidism    IBS (irritable bowel syndrome)    Neck pain    Spondylosis of lumbar spine    Past Surgical History:  Procedure Laterality Date   ABDOMINAL HYSTERECTOMY     APPENDECTOMY     BREAST BIOPSY     unsure which breast   CARDIAC CATHETERIZATION     x 2   CHOLECYSTECTOMY     EXPLORATORY LAPAROTOMY     HERNIA REPAIR     RADIOFREQUENCY ABLATION NERVES  03/2020   on back   TONSILLECTOMY     WRIST FRACTURE SURGERY Right     reports that she has never smoked. She has never used smokeless tobacco. She reports current alcohol use. She reports that she does not use drugs. family history includes Breast cancer in her maternal aunt; Colon cancer in her paternal grandmother. Allergies  Allergen Reactions   Hydrocodone-Acetaminophen Itching   Oxycodone Itching   Prednisone & Diphenhydramine Anaphylaxis    Patient indicates as of 08/23/20 that she has taken medication with no issue   Metronidazole Nausea And Vomiting    Flu like symptoms Flu like symptoms    Nortriptyline Other (See Comments)   Sulfamethoxazole Other (See Comments)    Flu-like symptoms.   Amoxicillin     Numbness   Iodinated Contrast Media     rash   Luminal [Phenobarbital] Other (See Comments)    Blisters   Mobic [Meloxicam]    Penicillins     Has patient had a PCN reaction causing immediate rash, facial/tongue/throat swelling, SOB or lightheadedness with hypotension:Y Has patient had a PCN reaction causing severe rash involving mucus membranes or skin necrosis: N Has patient had a PCN reaction that required hospitalization: N Has patient had a PCN reaction occurring within the last 10 years: N If all of the above answers are "NO", then may proceed with Cephalosporin use.    Tramadol Other (See Comments)   Allopurinol Rash   Ciprofloxacin Rash   Doxycycline Rash   Latex Rash      Outpatient Encounter Medications as of 08/07/2022  Medication Sig   cyclobenzaprine (FLEXERIL) 10 MG tablet Take 10 mg by mouth 3 (three) times daily as needed for muscle spasms.   EUTHYROX 137 MCG tablet Take 137 mcg by mouth every morning.   Famotidine (PEPCID PO) Take by mouth as needed.   hyoscyamine (LEVSIN SL) 0.125 MG SL tablet Place 1 tablet (0.125 mg total) under the  tongue every 8 (eight) hours as needed.   ibuprofen (ADVIL,MOTRIN) 200 MG tablet Take 200 mg by mouth 3 (three) times daily as needed for moderate pain.   linaclotide (LINZESS) 72 MCG capsule Take 1 capsule (72 mcg total) by mouth daily before breakfast.   metFORMIN (GLUCOPHAGE-XR) 500 MG 24 hr tablet Take 1,000 mg by mouth 2 (two) times daily.   polyethylene glycol powder (GLYCOLAX/MIRALAX) 17 GM/SCOOP powder Take 1 dose once to twice daily by mouth in 8 ounces of fluid (Patient taking differently: Take 1 dose once to twice daily by mouth in 8 ounces of fluid prn)   rosuvastatin (CRESTOR) 20 MG tablet Take 20 mg by mouth daily.   telmisartan (MICARDIS) 80 MG tablet Take 1 tablet by mouth daily before breakfast.    [DISCONTINUED] Cholecalciferol 125 MCG (5000 UT) TABS Take 5 tablets by mouth as needed. (Patient not taking: Reported on 06/27/2022)   [DISCONTINUED] dicyclomine (BENTYL) 10 MG capsule Take 1 capsule (10 mg total) by mouth in the morning and at bedtime. (Patient not taking: Reported on 06/27/2022)   [DISCONTINUED] levothyroxine (SYNTHROID) 125 MCG tablet Take 125 mcg by mouth daily before breakfast.   [DISCONTINUED] telmisartan (MICARDIS) 80 MG tablet Take 80 mg by mouth daily.   No facility-administered encounter medications on file as of 08/07/2022.    REVIEW OF SYSTEMS  : All other systems reviewed and negative except where noted in the History of Present Illness.   PHYSICAL EXAM: BP 110/64   Pulse (!) 108   Ht 5\' 3"  (1.6 m)   Wt 194 lb (88 kg)   BMI 34.37 kg/m  General: Well developed white female in no acute distress Head: Normocephalic and atraumatic Eyes:  Sclerae anicteric, conjunctiva pink. Ears: Normal auditory acuity Musculoskeletal: Symmetrical with no gross deformities  Skin: No lesions on visible extremities Extremities: No edema  Neurological: Alert oriented x 4, grossly non-focal Psychological:  Alert and cooperative. Normal mood and affect  ASSESSMENT AND PLAN: 77 year old female with history of IBS here for follow-up of complaints of altered bowel habits, generalized abdominal pain and bloating.  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  She did not tolerate even 72 mcg of Linzess.  She is using 1-1/2 capfuls of MiraLAX daily.  Need to keep stools on the softer side due to the rectosigmoid stenosis.  She also has issues with being able to expel the stool even though it is often soft and sometimes watery.  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  We discussed pelvic floor physical therapy, but she did not appear interested at this time.  She tried Citrucel in the past and did not do well with  that, but she is willing to try Benefiber instead.  Will start with 2 teaspoons mixed in 8 ounces of liquid daily in the evenings, opposite her MiraLAX to try to add bulk to the stool.  She did decide to proceed with CT scan of the abdomen and pelvis with contrast.  We have to premedicate her due to rash with iodinated contrast previously.   CC:  Geoffry Paradise, MD

## 2022-08-07 NOTE — Patient Instructions (Signed)
Since you are allergic to one or more components in IV contrast, we have sent a prescription for (3) 50 mg tablets of Prednisone to your pharmacy as a Pre-Medication Prep for your upcoming procedure requiring contrast.    Take (1) 50 mg tablet of prednisone 13 hours prior to your procedure at  08/11/22 at 8:30 am Take (1) 50 mg tablet 7 hours prior to your procedure at.   08/12/22 at 2:30 AM Take (1) 50 mg tablet 1 hour prior to your procedure at.  08/12/22 AT 8:30 AM  You also need to take 50 mg of Benadryl 1 hour prior to your procedure at  08/12/22 8:30 AM.  If you have 25 mg tablets of Benadryl, which can be purchased over the counter, you may take 2 tablets.  Otherwise we can send a prescription for (1) 50 mg tablet of Benadryl to your pharmacy.   You have been scheduled for a CT scan of the abdomen and pelvis at [facility]. You are scheduled on Monday 08/12/22 at 9:30. You should arrive 15 minutes prior to your appointment time for registration.  We are giving you 2 bottles of contrast today that you will need to drink before arriving for the exam. The solution may taste better if refrigerated so put them in the refrigerator when you get home, but do NOT add ice or any other liquid to this solution as that would dilute it. Shake well before drinking.   Please follow the written instructions below on the day of your exam:   1) Do not eat anything after 5:30 am (4 hours prior to your test)   2) Drink 1 bottle of contrast @ 7:30 AM (2 hours prior to your exam)  Remember to shake well before drinking and do NOT pour over ice.     Drink 1 bottle of contrast @ 8:30 AM (1 hour prior to your exam)   You may take any medications as prescribed with a small amount of water, if necessary. If you take any of the following medications: METFORMIN, GLUCOPHAGE, GLUCOVANCE, AVANDAMET, RIOMET, FORTAMET, ACTOPLUS MET, JANUMET, GLUMETZA or METAGLIP, you MAY be asked to HOLD this medication 48 hours AFTER the exam.    The purpose of you drinking the oral contrast is to aid in the visualization of your intestinal tract. The contrast solution may cause some diarrhea. Depending on your individual set of symptoms, you may also receive an intravenous injection of x-ray contrast/dye. Plan on being at [facility] for 45 minutes or longer, depending on the type of exam you are having performed.   If you have any questions regarding your exam or if you need to reschedule, you may call [facility] at [phone] between the hours of 8:00 am and 5:00 pm, Monday-Friday.       Please continue your Benefiber 2 tsp in 6 or 8 ounces of water daily   _______________________________________________________  If your blood pressure at your visit was 140/90 or greater, please contact your primary care physician to follow up on this.  _______________________________________________________  If you are age 75 or older, your body mass index should be between 23-30. Your Body mass index is 34.37 kg/m. If this is out of the aforementioned range listed, please consider follow up with your Primary Care Provider.  If you are age 75 or younger, your body mass index should be between 19-25. Your Body mass index is 34.37 kg/m. If this is out of the aformentioned range listed, please  consider follow up with your Primary Care Provider.   _______________________________________________________:  The [facility] providers would like to encourage you to use [facility] to communicate with providers for non-urgent requests or questions.  Due to long hold times on the telephone, sending your provider a message by [facility] may be a faster and more efficient way to get a response.  Please allow 48 business hours for a response.  Please remember that this is for non-urgent requests.  _______________________________________________________   We have received you test message. Thank you for signing up for MyChart! Have a great day!

## 2022-08-07 NOTE — Progress Notes (Signed)
Phone call to patient to review instructions for 13 hr prep for CT w/ contrast on 08/12/22  at 8:50 am. Prescription called into [facility]. Pt aware and verbalized understanding of instructions.  Prescription: Pt to take 50 mg of prednisone on 08/11/22 at 8:50 pm, 50 mg of prednisone on 08/12/22 at 2:50 am, and 50 mg of prednisone on 08/12/22 at 8:50 am. Pt is also to take 50 mg of benadryl on 08/12/22 at 8:50 am. Please call [phone] with any questions before Saturday.

## 2022-08-07 NOTE — Progress Notes (Signed)
Noted. Agree with plans

## 2022-08-12 ENCOUNTER — Ambulatory Visit
Admission: RE | Admit: 2022-08-12 | Discharge: 2022-08-12 | Disposition: A | Discharge status: Home / Self Care | Payer: Medicare Other | Source: Ambulatory Visit | Attending: Gastroenterology | Admitting: Gastroenterology

## 2022-08-12 DIAGNOSIS — R1084 Generalized abdominal pain: Secondary | ICD-10-CM

## 2022-08-12 DIAGNOSIS — R194 Change in bowel habit: Secondary | ICD-10-CM | POA: Diagnosis not present

## 2022-08-12 DIAGNOSIS — R140 Abdominal distension (gaseous): Secondary | ICD-10-CM

## 2022-08-12 MED ORDER — PREDNISONE 50 MG PO TABS: 50.0000 mg | ORAL_TABLET | Freq: Four times a day (QID) | ORAL | Status: DC

## 2022-08-12 MED ORDER — DIPHENHYDRAMINE HCL 50 MG PO CAPS: 50.0000 mg | ORAL_CAPSULE | Freq: Once | ORAL | Status: DC

## 2022-08-12 MED ORDER — IOPAMIDOL (ISOVUE-300) INJECTION 61%
100.0000 mL | Freq: Once | INTRAVENOUS | Status: AC | PRN
  Administered 2022-08-12: 100 mL via INTRAVENOUS

## 2022-08-12 MED ORDER — DIPHENHYDRAMINE HCL 50 MG/ML IJ SOLN: 50.0000 mg | Freq: Once | INTRAMUSCULAR | Status: DC

## 2022-08-16 ENCOUNTER — Telehealth: Payer: Self-pay | Admitting: Gastroenterology

## 2022-08-16 NOTE — Telephone Encounter (Signed)
Inbound call from patient returning phone call regarding recent imaging results. Please advise, thank you.  

## 2022-08-19 NOTE — Telephone Encounter (Signed)
PT returning call to discuss results. Please advise 

## 2022-08-19 NOTE — Telephone Encounter (Signed)
See alternate results note.  

## 2022-09-03 ENCOUNTER — Ambulatory Visit: Payer: Medicare Other | Admitting: Gastroenterology

## 2022-09-13 DIAGNOSIS — M25551 Pain in right hip: Secondary | ICD-10-CM | POA: Diagnosis not present

## 2022-09-13 DIAGNOSIS — M1612 Unilateral primary osteoarthritis, left hip: Secondary | ICD-10-CM | POA: Diagnosis not present

## 2022-09-17 ENCOUNTER — Telehealth: Payer: Self-pay | Admitting: *Deleted

## 2022-09-17 NOTE — Telephone Encounter (Signed)
   Pre-operative Risk Assessment    Patient Name: Jillian [name]  DOB: 1945/03/23 MRN: [id number]      Request for Surgical Clearance    Procedure:   Left total hip arthroplasty  Date of Surgery:  Clearance 12/25/22                                 Surgeon:  Dr. Ollen Gross  Surgeon's Group or Practice Name:  [facility] Phone number:  [phone] Fax number:  [phone]   Type of Clearance Requested:   - Medical    Type of Anesthesia:   Choice   Additional requests/questions:  Pt has new pt appt with Dr. Flora Lipps on 10/15/2022. Pre op added to appt notes.   Signed, Emmit Pomfret   09/17/2022, 10:20 AM

## 2022-09-17 NOTE — Telephone Encounter (Signed)
   Name: Jillian [name]  DOB: 15-Jan-1946  MRN: [id number]  Primary Cardiologist: None  Chart reviewed as part of pre-operative protocol coverage. The patient has an upcoming visit scheduled with Dr. Flora Lipps on 10/15/2022 at which time clearance can be addressed in case there are any issues that would impact surgical recommendations.  Left total hip arthoplasty is not scheduled until 12/25/2022 as below. I added preop FYI to appointment note so that provider is aware to address at time of outpatient visit.  Per office protocol the cardiology provider should forward their finalized clearance decision and recommendations regarding antiplatelet therapy to the requesting party below.    I will route this message as FYI to requesting party and remove this message from the preop box as separate preop APP input not needed at this time.   Please call with any questions.  Joylene Grapes, NP  09/17/2022, 12:30 PM

## 2022-09-19 ENCOUNTER — Telehealth: Payer: Self-pay | Admitting: Gastroenterology

## 2022-09-19 NOTE — Telephone Encounter (Signed)
The pt has been advised that I have not reached out to her.  It may have been an old message.  The pt has been advised of the information and verbalized understanding.

## 2022-09-19 NOTE — Telephone Encounter (Signed)
Inbound call from patient stating that she has receive a missed call from nurse Patty and is requesting a call back. Please advise.

## 2022-09-27 ENCOUNTER — Encounter: Payer: Self-pay | Admitting: Cardiovascular Disease

## 2022-09-27 ENCOUNTER — Ambulatory Visit: Payer: Medicare Other | Attending: Cardiovascular Disease | Admitting: Cardiovascular Disease

## 2022-09-27 VITALS — BP 144/73 | HR 101 | Ht 64.0 in | Wt 198.8 lb

## 2022-09-27 DIAGNOSIS — Z8249 Family history of ischemic heart disease and other diseases of the circulatory system: Secondary | ICD-10-CM | POA: Diagnosis not present

## 2022-09-27 DIAGNOSIS — I700 Atherosclerosis of aorta: Secondary | ICD-10-CM | POA: Diagnosis not present

## 2022-09-27 DIAGNOSIS — E782 Mixed hyperlipidemia: Secondary | ICD-10-CM | POA: Diagnosis not present

## 2022-09-27 DIAGNOSIS — R072 Precordial pain: Secondary | ICD-10-CM | POA: Diagnosis not present

## 2022-09-27 NOTE — Patient Instructions (Signed)
Medication Instructions:  NO CHANGES *If you need a refill on your cardiac medications before your next appointment, please call your pharmacy*   Lab Work: NONE If you have labs (blood work) drawn today and your tests are completely normal, you will receive your results only by: MyChart Message (if you have MyChart) OR A paper copy in the mail If you have any lab test that is abnormal or we need to change your treatment, we will call you to review the results.   Testing/Procedures: How to Prepare for Your Cardiac PET/CT Stress Test:  1. Please do not take these medications before your test:   Medications that may interfere with the cardiac pharmacological stress agent (ex. nitrates - including erectile dysfunction medications, isosorbide mononitrate, tamulosin or beta-blockers) the day of the exam. (Erectile dysfunction medication should be held for at least 72 hrs prior to test) Theophylline containing medications for 12 hours. Dipyridamole 48 hours prior to the test. Your remaining medications may be taken with water.  2. Nothing to eat or drink, except water, 3 hours prior to arrival time.   NO caffeine/decaffeinated products, or chocolate 12 hours prior to arrival.  3. NO perfume, cologne or lotion  4. Total time is 1 to 2 hours; you may want to bring reading material for the waiting time.  5. Please report to Radiology at the [facility] Main Entrance 30 minutes early for your test.  [address] [place], [place] [address]  6. Please report to Radiology at [facility] Main Entrance, medical mall, 30 mins prior to your test.  [address]  [place], [place]  [phone]  Diabetic Preparation:  Hold oral medications. You may take NPH and Lantus insulin. Do not take Humalog or Humulin R (Regular Insulin) the day of your test. Check blood sugars prior to leaving the house. If able to eat breakfast prior to 3 hour fasting, you may  take all medications, including your insulin, Do not worry if you miss your breakfast dose of insulin - start at your next meal.  IF YOU THINK YOU MAY BE PREGNANT, OR ARE NURSING PLEASE INFORM THE TECHNOLOGIST.  In preparation for your appointment, medication and supplies will be purchased.  Appointment availability is limited, so if you need to cancel or reschedule, please call the Radiology Department at [phone] [facility]) OR [phone] [facility])  24 hours in advance to avoid a cancellation fee of $100.00  What to Expect After you Arrive:  Once you arrive and check in for your appointment, you will be taken to a preparation room within the Radiology Department.  A technologist or Nurse will obtain your medical history, verify that you are correctly prepped for the exam, and explain the procedure.  Afterwards,  an IV will be started in your arm and electrodes will be placed on your skin for EKG monitoring during the stress portion of the exam. Then you will be escorted to the PET/CT scanner.  There, staff will get you positioned on the scanner and obtain a blood pressure and EKG.  During the exam, you will continue to be connected to the EKG and blood pressure machines.  A small, safe amount of a radioactive tracer will be injected in your IV to obtain a series of pictures of your heart along with an injection of a stress agent.    After your Exam:  It is recommended that you eat a meal and drink a caffeinated beverage to counter act  any effects of the stress agent.  Drink plenty of fluids for the remainder of the day and urinate frequently for the first couple of hours after the exam.  Your doctor will inform you of your test results within 7-10 business days.  For more information and frequently asked questions, please visit our website : [email or web address]  For questions about your test or how to prepare for your test, please call: Cardiac Imaging Nurse Navigators Office:  [phone]    Follow-Up: At [facility], you and your health needs are our priority.  As part of our continuing mission to provide you with exceptional heart care, we have created designated Provider Care Teams.  These Care Teams include your primary Cardiologist (physician) and Advanced Practice Providers (APPs -  Physician Assistants and Nurse Practitioners) who all work together to provide you with the care you need, when you need it.  We recommend signing up for the patient portal called "MyChart".  Sign up information is provided on this After Visit Summary.  MyChart is used to connect with patients for Virtual Visits (Telemedicine).  Patients are able to view lab/test results, encounter notes, upcoming appointments, etc.  Non-urgent messages can be sent to your provider as well.   To learn more about what you can do with MyChart, go to [email or web address].    Your next appointment:   6 month(s)  Provider:   DR Flora Lipps    Other Instructions NONE

## 2022-09-27 NOTE — Progress Notes (Signed)
Cardiology Office Note:   Date:  09/27/2022  NAME:  Jillian [name]    MRN: [id number] DOB:  [birth year]   PCP:  Geoffry Paradise, MD  Cardiologist:  None  Electrophysiologist:  None   Referring MD: Geoffry Paradise, MD   Chief Complaint  Patient presents with   Chest Pain    History of Present Illness:   Jillian [name] is a 77 y.o. female with a hx of diabetes, hypertension, hyperlipidemia who is being seen today for the evaluation of chest pain at the request of Geoffry Paradise, MD. she reports for the past several months she has had episodes of tightness in her chest.  Occurs with sitting.  She also reports exertional shortness of breath.  She will have to undergo hip surgery and is not that active.  She is also overweight with a BMI of 34.  Her EKG shows sinus tachycardia.  She cannot exercise on a treadmill.  She does have nonspecific ST-T changes.  She reports a strong family history of heart disease.  Her father had heart disease.  Her mother had an intestinal blockage.  She is not that active.  She is diabetic.  Her LDL is close enough to goal.  She does have a CT scan of her abdomen and pelvis.  This does show evidence of coronary calcium.  She has never personally had a heart attack or stroke.  CV exam unremarkable.  No signs of heart failure.  She does not smoke.  No alcohol or drug use.  She does live in [place].  We did discuss cardiac PET stress test as this will be a better test as she cannot exercise, has an abnormal EKG and is obese.   Problem List DM -A1c 5.6 2. HLD -T chol 196, HDL 78, LDL 85, TG 163 3. HTN  Past Medical History: Past Medical History:  Diagnosis Date   Allergy    Anxiety    Bowel obstruction (HCC)    Cataract    Diabetes mellitus without complication (HCC)    Diverticulitis    GERD (gastroesophageal reflux disease)    Gout    High cholesterol    Hypertension    Hypothyroidism    IBS (irritable bowel syndrome)    Neck pain     Spondylosis of lumbar spine     Past Surgical History: Past Surgical History:  Procedure Laterality Date   ABDOMINAL HYSTERECTOMY     APPENDECTOMY     BREAST BIOPSY     unsure which breast   CARDIAC CATHETERIZATION     x 2   CHOLECYSTECTOMY     EXPLORATORY LAPAROTOMY     HERNIA REPAIR     RADIOFREQUENCY ABLATION NERVES  03/2020   on back   TONSILLECTOMY     WRIST FRACTURE SURGERY Right     Current Medications: Current Meds  Medication Sig   cyclobenzaprine (FLEXERIL) 10 MG tablet Take 10 mg by mouth 3 (three) times daily as needed for muscle spasms.   diphenhydrAMINE (BENADRYL) 50 MG tablet Pt to take 50 mg of prednisone on 08/11/22 at 8:50 pm, 50 mg of prednisone on 08/12/22 at 2:50 am, and 50 mg of prednisone on 08/12/22 at 8:50 am. Pt is also to take 50 mg of benadryl on 08/12/22 at 8:50 am. Please call [phone] with any questions before Saturday.   EUTHYROX 137 MCG tablet Take 137 mcg by mouth every morning.   Famotidine (PEPCID PO) Take by mouth as needed.   hyoscyamine (  LEVSIN SL) 0.125 MG SL tablet Place 1 tablet (0.125 mg total) under the tongue every 8 (eight) hours as needed.   ibuprofen (ADVIL,MOTRIN) 200 MG tablet Take 200 mg by mouth 3 (three) times daily as needed for moderate pain.   linaclotide (LINZESS) 72 MCG capsule Take 1 capsule (72 mcg total) by mouth daily before breakfast.   metFORMIN (GLUCOPHAGE-XR) 500 MG 24 hr tablet Take 1,000 mg by mouth 2 (two) times daily.   polyethylene glycol powder (GLYCOLAX/MIRALAX) 17 GM/SCOOP powder Take 1 dose once to twice daily by mouth in 8 ounces of fluid (Patient taking differently: Take 1 dose once to twice daily by mouth in 8 ounces of fluid prn)   predniSONE (DELTASONE) 50 MG tablet Use as directed   rosuvastatin (CRESTOR) 20 MG tablet Take 20 mg by mouth daily.   telmisartan (MICARDIS) 80 MG tablet Take 1 tablet by mouth daily before breakfast.     Allergies:    Hydrocodone-acetaminophen, Oxycodone,  Metronidazole, Nortriptyline, Sulfamethoxazole, Amoxicillin, Iodinated contrast media, Luminal [phenobarbital], Mobic [meloxicam], Penicillins, Tramadol, Allopurinol, Ciprofloxacin, Doxycycline, and Latex   Social History: Social History   Socioeconomic History   Marital status: Married    Spouse name: Not on file   Number of children: 1   Years of education: Not on file   Highest education level: Not on file  Occupational History   Occupation: Retired  Tobacco Use   Smoking status: Never   Smokeless tobacco: Never  Vaping Use   Vaping status: Never Used  Substance and Sexual Activity   Alcohol use: Yes    Comment: occasional   Drug use: Never   Sexual activity: Yes  Other Topics Concern   Not on file  Social History Narrative   Not on file   Social Determinants of Health   Financial Resource Strain: Not on file  Food Insecurity: No Food Insecurity (05/10/2020)   Received from [facility], [facility]   Hunger Vital Sign    Worried About Running Out of Food in the Last Year: Never true    Ran Out of Food in the Last Year: Never true  Transportation Needs: Not on file  Physical Activity: Not on file  Stress: Stress Concern Present (09/26/2020)   Received from [facility], [facility]   [employer] [facility]ational [facility] - Occupational Stress Questionnaire    Feeling of Stress : To some extent  Social Connections: Unknown (07/06/2021)   Received from [facility], [facility]   Social Network    Social Network: Not on file     Family History: The patient's family history includes Breast cancer in her maternal aunt; Colon cancer in her paternal grandmother; Heart attack in her father.  ROS:   All other ROS reviewed and negative. Pertinent positives noted in the HPI.     EKGs/Labs/Other Studies Reviewed:   The following studies were personally reviewed by me today:  EKG:  EKG is ordered today.  EKG shows sinus tachycardia heart rate 101 with single  PVC      TTE 01/14/2022 Left ventricle cavity is normal in size and wall thickness. Normal global  wall motion. Normal LV systolic function with visual EF 60-65%. Doppler  evidence of grade I (impaired) diastolic dysfunction, normal LAP.  The aortic root is normal. Upper limit normal ascending aorta measuring  3.78 cm.  No significant valvular abnormality.  Normal right atrial pressure.   Recent Labs: No results found for requested labs within last 365 days.  Recent Lipid Panel No results found for: "CHOL", "TRIG", "HDL", "CHOLHDL", "VLDL", "LDLCALC", "LDLDIRECT"  Physical Exam:   VS:  BP (!) 144/73 (BP Location: Left Arm, Patient Position: Sitting, Cuff Size: Normal)   Pulse (!) 101   Ht 5\' 4"  (1.626 m)   Wt 198 lb 12.8 oz (90.2 kg)   SpO2 94%   BMI 34.12 kg/m    Wt Readings from Last 3 Encounters:  09/27/22 198 lb 12.8 oz (90.2 kg)  08/07/22 194 lb (88 kg)  06/27/22 201 lb (91.2 kg)    General: Well nourished, well developed, in no acute distress Head: Atraumatic, normal size  Eyes: PEERLA, EOMI  Neck: Supple, no JVD Endocrine: No thryomegaly Cardiac: Normal S1, S2; RRR; no murmurs, rubs, or gallops Lungs: Clear to auscultation bilaterally, no wheezing, rhonchi or rales  Abd: Soft, nontender, no hepatomegaly  Ext: No edema, pulses 2+ Musculoskeletal: No deformities, BUE and BLE strength normal and equal Skin: Warm and dry, no rashes   Neuro: Alert and oriented to person, place, time, and situation, CNII-XII grossly intact, no focal deficits  Psych: Normal mood and affect   ASSESSMENT:   Jillian [name] is a 77 y.o. female who presents for the following: 1. Precordial pain   2. Family history of heart disease   3. Mixed hyperlipidemia   4. Aortic atherosclerosis (HCC)     PLAN:   1. Precordial pain 2. Family history of heart disease 3. Mixed hyperlipidemia 4. Aortic atherosclerosis (HCC) -She describes episodes of tightness in her chest that occur  with rest.  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  She does get short of breath with activity.  She has coronary calcium.  Her EKG is abnormal with nonspecific ST-T changes.  Echo last year was normal.  She is obese with a BMI of 34.  She has an allergy to contrast.  She tells me when she has had CT contrast in the past she has had a rash.  I have recommended a cardiac PET stress test.  Given her obesity, abnormal EKG and inability to exercise I believe this is the best test for her.  She has a strong family history of heart disease and this will provide clarity given her symptoms of chest discomfort which could be stress related.  She will also need to undergo hip surgery in the next few months.  As long as her stress test is normal she can undergo surgery.  She cannot complete greater than 4 METS and I believe a PET stress test is the best option for her.   Informed Consent   Shared Decision Making/Informed Consent The risks [chest pain, shortness of breath, cardiac arrhythmias, dizziness, blood pressure fluctuations, myocardial infarction, stroke/transient ischemic attack, nausea, vomiting, allergic reaction, radiation exposure, metallic taste sensation and life-threatening complications (estimated to be 1 in 10,000)], benefits (risk stratification, diagnosing coronary artery disease, treatment guidance) and alternatives of a cardiac PET stress test were discussed in detail with Jillian [name] and she agrees to proceed.     Disposition: Return in about 6 months (around 03/30/2023).  Medication Adjustments/Labs and Tests Ordered: Current medicines are reviewed at length with the patient today.  Concerns regarding medicines are outlined above.  Orders Placed This Encounter  Procedures   NM PET CT CARDIAC PERFUSION MULTI W/ABSOLUTE BLOODFLOW   Cardiac Stress Test: Informed Consent Details: Physician/Practitioner Attestation; Transcribe to consent form and obtain  patient signature   EKG  12-Lead   No orders of the defined types were placed in this encounter.  Patient Instructions  Medication Instructions:  NO CHANGES *If you need a refill on your cardiac medications before your next appointment, please call your pharmacy*   Lab Work: NONE If you have labs (blood work) drawn today and your tests are completely normal, you will receive your results only by: MyChart Message (if you have MyChart) OR A paper copy in the mail If you have any lab test that is abnormal or we need to change your treatment, we will call you to review the results.   Testing/Procedures: How to Prepare for Your Cardiac PET/CT Stress Test:  1. Please do not take these medications before your test:   Medications that may interfere with the cardiac pharmacological stress agent (ex. nitrates - including erectile dysfunction medications, isosorbide mononitrate, tamulosin or beta-blockers) the day of the exam. (Erectile dysfunction medication should be held for at least 72 hrs prior to test) Theophylline containing medications for 12 hours. Dipyridamole 48 hours prior to the test. Your remaining medications may be taken with water.  2. Nothing to eat or drink, except water, 3 hours prior to arrival time.   NO caffeine/decaffeinated products, or chocolate 12 hours prior to arrival.  3. NO perfume, cologne or lotion  4. Total time is 1 to 2 hours; you may want to bring reading material for the waiting time.  5. Please report to Radiology at the [facility] Main Entrance 30 minutes early for your test.  [address] [place], [place] [address]  6. Please report to Radiology at [facility] Main Entrance, medical mall, 30 mins prior to your test.  [address]  [place], [place]  [phone]  Diabetic Preparation:  Hold oral medications. You may take NPH and Lantus insulin. Do not take Humalog or Humulin R (Regular Insulin) the  day of your test. Check blood sugars prior to leaving the house. If able to eat breakfast prior to 3 hour fasting, you may take all medications, including your insulin, Do not worry if you miss your breakfast dose of insulin - start at your next meal.  IF YOU THINK YOU MAY BE PREGNANT, OR ARE NURSING PLEASE INFORM THE TECHNOLOGIST.  In preparation for your appointment, medication and supplies will be purchased.  Appointment availability is limited, so if you need to cancel or reschedule, please call the Radiology Department at [phone] [facility]) OR [phone] [facility])  24 hours in advance to avoid a cancellation fee of $100.00  What to Expect After you Arrive:  Once you arrive and check in for your appointment, you will be taken to a preparation room within the Radiology Department.  A technologist or Nurse will obtain your medical history, verify that you are correctly prepped for the exam, and explain the procedure.  Afterwards,  an IV will be started in your arm and electrodes will be placed on your skin for EKG monitoring during the stress portion of the exam. Then you will be escorted to the PET/CT scanner.  There, staff will get you positioned on the scanner and obtain a blood pressure and EKG.  During the exam, you will continue to be connected to the EKG and blood pressure machines.  A small, safe amount of a radioactive tracer will be injected in your IV to obtain a series of pictures of your heart along with an injection of a stress agent.    After your  Exam:  It is recommended that you eat a meal and drink a caffeinated beverage to counter act any effects of the stress agent.  Drink plenty of fluids for the remainder of the day and urinate frequently for the first couple of hours after the exam.  Your doctor will inform you of your test results within 7-10 business days.  For more information and frequently asked questions, please visit our website :  [email or web address]  For questions about your test or how to prepare for your test, please call: Cardiac Imaging Nurse Navigators Office: [phone]    Follow-Up: At [facility], you and your health needs are our priority.  As part of our continuing mission to provide you with exceptional heart care, we have created designated Provider Care Teams.  These Care Teams include your primary Cardiologist (physician) and Advanced Practice Providers (APPs -  Physician Assistants and Nurse Practitioners) who all work together to provide you with the care you need, when you need it.  We recommend signing up for the patient portal called "MyChart".  Sign up information is provided on this After Visit Summary.  MyChart is used to connect with patients for Virtual Visits (Telemedicine).  Patients are able to view lab/test results, encounter notes, upcoming appointments, etc.  Non-urgent messages can be sent to your provider as well.   To learn more about what you can do with MyChart, go to [email or web address].    Your next appointment:   6 month(s)  Provider:   DR Flora Lipps    Other Instructions NONE     Signed, Lenna Gilford. Flora Lipps, MD, [facility]  [facility]  [address] [place], [place] [address] [phone]  09/27/2022 3:38 PM

## 2022-09-30 ENCOUNTER — Encounter (HOSPITAL_COMMUNITY): Payer: Self-pay

## 2022-10-07 ENCOUNTER — Telehealth: Payer: Self-pay | Admitting: Cardiovascular Disease

## 2022-10-07 NOTE — Telephone Encounter (Signed)
Pt c/o medication issue:  1. Name of Medication: Clindamycin   2. How are you currently taking this medication (dosage and times per day)?   3. Are you having a reaction (difficulty breathing--STAT)?   4. What is your medication issue?    Patient is requesting call back to discuss medication she is now taking for an infection in her leg. She is worried she will not be able to do upcoming tests while taking this medication and would like a call back to discuss further.

## 2022-10-07 NOTE — Telephone Encounter (Signed)
Patient aware to keep appointment for CT

## 2022-10-07 NOTE — Telephone Encounter (Signed)
Patient stated she started taking clindamycin yesterday and would like to know if she will need to reschedule her CT because she os pn clindamycin

## 2022-10-08 ENCOUNTER — Telehealth: Payer: Self-pay | Admitting: Cardiovascular Disease

## 2022-10-08 DIAGNOSIS — E1169 Type 2 diabetes mellitus with other specified complication: Secondary | ICD-10-CM | POA: Diagnosis not present

## 2022-10-08 NOTE — Telephone Encounter (Signed)
Patient is calling because she is sick and needs to r/s her CT Pet Scan. Please advise.

## 2022-10-09 ENCOUNTER — Telehealth (HOSPITAL_COMMUNITY): Payer: Self-pay | Admitting: Emergency Medicine

## 2022-10-09 NOTE — Telephone Encounter (Signed)
Attempted to call patient regarding upcoming cardiac PET appointment. Left message on voicemail with name and callback number Sara Wallace RN Navigator Cardiac Imaging [facility] [phone] Office [phone] Cell  

## 2022-10-10 ENCOUNTER — Ambulatory Visit: Payer: Medicare Other

## 2022-10-14 DIAGNOSIS — E1140 Type 2 diabetes mellitus with diabetic neuropathy, unspecified: Secondary | ICD-10-CM | POA: Diagnosis not present

## 2022-10-14 DIAGNOSIS — E039 Hypothyroidism, unspecified: Secondary | ICD-10-CM | POA: Diagnosis not present

## 2022-10-14 DIAGNOSIS — M109 Gout, unspecified: Secondary | ICD-10-CM | POA: Diagnosis not present

## 2022-10-14 DIAGNOSIS — E785 Hyperlipidemia, unspecified: Secondary | ICD-10-CM | POA: Diagnosis not present

## 2022-10-15 ENCOUNTER — Ambulatory Visit: Payer: Medicare Other | Admitting: Cardiovascular Disease

## 2022-10-18 DIAGNOSIS — L900 Lichen sclerosus et atrophicus: Secondary | ICD-10-CM | POA: Diagnosis not present

## 2022-10-18 DIAGNOSIS — L304 Erythema intertrigo: Secondary | ICD-10-CM | POA: Diagnosis not present

## 2022-10-21 DIAGNOSIS — Z0000 Encounter for general adult medical examination without abnormal findings: Secondary | ICD-10-CM | POA: Diagnosis not present

## 2022-10-21 DIAGNOSIS — I10 Essential (primary) hypertension: Secondary | ICD-10-CM | POA: Diagnosis not present

## 2022-10-21 DIAGNOSIS — R82998 Other abnormal findings in urine: Secondary | ICD-10-CM | POA: Diagnosis not present

## 2022-10-21 DIAGNOSIS — Z1339 Encounter for screening examination for other mental health and behavioral disorders: Secondary | ICD-10-CM | POA: Diagnosis not present

## 2022-10-21 DIAGNOSIS — Z1212 Encounter for screening for malignant neoplasm of rectum: Secondary | ICD-10-CM | POA: Diagnosis not present

## 2022-10-21 DIAGNOSIS — E1140 Type 2 diabetes mellitus with diabetic neuropathy, unspecified: Secondary | ICD-10-CM | POA: Diagnosis not present

## 2022-10-21 DIAGNOSIS — M160 Bilateral primary osteoarthritis of hip: Secondary | ICD-10-CM | POA: Diagnosis not present

## 2022-10-21 DIAGNOSIS — Z1331 Encounter for screening for depression: Secondary | ICD-10-CM | POA: Diagnosis not present

## 2022-10-23 ENCOUNTER — Telehealth (HOSPITAL_COMMUNITY): Payer: Self-pay | Admitting: *Deleted

## 2022-10-23 NOTE — Telephone Encounter (Signed)
Reaching out to patient to offer assistance regarding upcoming cardiac imaging study; pt verbalizes understanding of appt date/time, parking situation and where to check in, pre-test NPO status and verified current allergies; name and call back number provided for further questions should they arise  Larey Brick RN Navigator Cardiac Imaging [facility] [phone] office [phone] cell  Patient aware to avoid caffeine 12 hours prior two hours to her cardiac PET scan.

## 2022-10-24 ENCOUNTER — Ambulatory Visit
Admission: RE | Admit: 2022-10-24 | Discharge: 2022-10-24 | Disposition: A | Discharge status: Home / Self Care | Payer: Medicare Other | Source: Ambulatory Visit | Attending: Cardiovascular Disease | Admitting: Cardiovascular Disease

## 2022-10-24 DIAGNOSIS — I700 Atherosclerosis of aorta: Secondary | ICD-10-CM | POA: Insufficient documentation

## 2022-10-24 DIAGNOSIS — R931 Abnormal findings on diagnostic imaging of heart and coronary circulation: Secondary | ICD-10-CM | POA: Diagnosis not present

## 2022-10-24 DIAGNOSIS — I2510 Atherosclerotic heart disease of native coronary artery without angina pectoris: Secondary | ICD-10-CM | POA: Insufficient documentation

## 2022-10-24 DIAGNOSIS — R072 Precordial pain: Secondary | ICD-10-CM | POA: Diagnosis not present

## 2022-10-24 MED ORDER — RUBIDIUM RB82 GENERATOR (RUBYFILL)
25.0000 | PACK | Freq: Once | INTRAVENOUS | Status: AC
  Administered 2022-10-24: 23.21 via INTRAVENOUS

## 2022-10-24 MED ORDER — RUBIDIUM RB82 GENERATOR (RUBYFILL)
25.0000 | PACK | Freq: Once | INTRAVENOUS | Status: AC
  Administered 2022-10-24: 23.24 via INTRAVENOUS

## 2022-10-24 MED ORDER — REGADENOSON 0.4 MG/5ML IV SOLN
0.4000 mg | Freq: Once | INTRAVENOUS | Status: AC
  Administered 2022-10-24: 0.4 mg via INTRAVENOUS
  Filled 2022-10-24: qty 5

## 2022-10-24 MED ORDER — REGADENOSON 0.4 MG/5ML IV SOLN
INTRAVENOUS | Status: AC
  Filled 2022-10-24: qty 5

## 2022-10-24 NOTE — Progress Notes (Signed)
Patient presents for a cardiac PET stress test and tolerated procedure without incident. She did reports some chest tightness and nausea but that resolved by test completion.Patient maintained acceptable vital signs throughout the test and was offered caffeine after test.  Patient escorted out of department in a wheelchair.

## 2022-10-25 LAB — NM PET CT CARDIAC PERFUSION MULTI W/ABSOLUTE BLOODFLOW
MBFR: 2.38
Nuc Rest EF: 63 %
Nuc Stress EF: 72 %
Peak HR: 127 {beats}/min
Rest HR: 93 {beats}/min
Rest MBF: 2.02 ml/g/min
Rest Nuclear Isotope Dose: 23.2 mCi
SRS: 0
SSS: 4
Stress MBF: 4.8 ml/g/min
Stress Nuclear Isotope Dose: 23.2 mCi
TID: 1

## 2022-10-29 ENCOUNTER — Emergency Department (HOSPITAL_COMMUNITY): Payer: Medicare Other

## 2022-10-29 ENCOUNTER — Telehealth: Payer: Self-pay | Admitting: Cardiovascular Disease

## 2022-10-29 ENCOUNTER — Observation Stay (HOSPITAL_COMMUNITY)
Admission: EM | Admit: 2022-10-29 | Discharge: 2022-10-30 | Disposition: A | Discharge status: Home / Self Care | Payer: Medicare Other | Attending: Cardiovascular Disease | Admitting: Cardiovascular Disease

## 2022-10-29 ENCOUNTER — Other Ambulatory Visit: Payer: Self-pay

## 2022-10-29 ENCOUNTER — Encounter (HOSPITAL_COMMUNITY): Payer: Self-pay | Admitting: Cardiovascular Disease

## 2022-10-29 DIAGNOSIS — I700 Atherosclerosis of aorta: Secondary | ICD-10-CM | POA: Diagnosis not present

## 2022-10-29 DIAGNOSIS — I10 Essential (primary) hypertension: Secondary | ICD-10-CM | POA: Diagnosis not present

## 2022-10-29 DIAGNOSIS — Z79899 Other long term (current) drug therapy: Secondary | ICD-10-CM | POA: Insufficient documentation

## 2022-10-29 DIAGNOSIS — Z91040 Latex allergy status: Secondary | ICD-10-CM | POA: Diagnosis not present

## 2022-10-29 DIAGNOSIS — I200 Unstable angina: Secondary | ICD-10-CM | POA: Diagnosis not present

## 2022-10-29 DIAGNOSIS — I25110 Atherosclerotic heart disease of native coronary artery with unstable angina pectoris: Secondary | ICD-10-CM | POA: Diagnosis not present

## 2022-10-29 DIAGNOSIS — I209 Angina pectoris, unspecified: Principal | ICD-10-CM

## 2022-10-29 DIAGNOSIS — Z7984 Long term (current) use of oral hypoglycemic drugs: Secondary | ICD-10-CM | POA: Insufficient documentation

## 2022-10-29 DIAGNOSIS — R079 Chest pain, unspecified: Secondary | ICD-10-CM | POA: Diagnosis present

## 2022-10-29 DIAGNOSIS — E039 Hypothyroidism, unspecified: Secondary | ICD-10-CM | POA: Diagnosis not present

## 2022-10-29 DIAGNOSIS — D649 Anemia, unspecified: Secondary | ICD-10-CM | POA: Diagnosis not present

## 2022-10-29 DIAGNOSIS — E7800 Pure hypercholesterolemia, unspecified: Secondary | ICD-10-CM | POA: Diagnosis not present

## 2022-10-29 DIAGNOSIS — E119 Type 2 diabetes mellitus without complications: Secondary | ICD-10-CM | POA: Diagnosis not present

## 2022-10-29 DIAGNOSIS — I2510 Atherosclerotic heart disease of native coronary artery without angina pectoris: Secondary | ICD-10-CM | POA: Insufficient documentation

## 2022-10-29 LAB — BASIC METABOLIC PANEL
Anion gap: 12 (ref 5–15)
BUN: 17 mg/dL (ref 8–23)
CO2: 22 mmol/L (ref 22–32)
Calcium: 9.9 mg/dL (ref 8.9–10.3)
Chloride: 107 mmol/L (ref 98–111)
Creatinine, Ser: 0.91 mg/dL (ref 0.44–1.00)
GFR, Estimated: >60 mL/min (ref >60)
Glucose, Bld: 102 mg/dL — ABNORMAL HIGH (ref 70–99)
Potassium: 4.2 mmol/L (ref 3.5–5.1)
Sodium: 141 mmol/L (ref 135–145)

## 2022-10-29 LAB — CBC
HCT: 38.9 % (ref 36.0–46.0)
HCT: 40.1 % (ref 36.0–46.0)
Hemoglobin: 12.3 g/dL (ref 12.0–15.0)
Hemoglobin: 12.9 g/dL (ref 12.0–15.0)
MCH: 28.4 pg (ref 26.0–34.0)
MCH: 28.8 pg (ref 26.0–34.0)
MCHC: 31.6 g/dL (ref 30.0–36.0)
MCHC: 32.2 g/dL (ref 30.0–36.0)
MCV: 89.8 fL (ref 80.0–100.0)
MCV: 89.5 fL (ref 80.0–100.0)
Platelets: 292 10*3/uL (ref 150–400)
Platelets: 315 10*3/uL (ref 150–400)
RBC: 4.33 MIL/uL (ref 3.87–5.11)
RBC: 4.48 MIL/uL (ref 3.87–5.11)
RDW: 14 % (ref 11.5–15.5)
RDW: 14 % (ref 11.5–15.5)
WBC: 8.4 10*3/uL (ref 4.0–10.5)
WBC: 10.8 10*3/uL — ABNORMAL HIGH (ref 4.0–10.5)
nRBC: 0 % (ref 0.0–0.2)
nRBC: 0 % (ref 0.0–0.2)

## 2022-10-29 LAB — TROPONIN I (HIGH SENSITIVITY)
Troponin I (High Sensitivity): 5 ng/L (ref <18)
Troponin I (High Sensitivity): 7 ng/L (ref <18)

## 2022-10-29 LAB — CREATININE, SERUM
Creatinine, Ser: 1.02 mg/dL — ABNORMAL HIGH (ref 0.44–1.00)
GFR, Estimated: 57 mL/min — ABNORMAL LOW (ref >60)

## 2022-10-29 LAB — BRAIN NATRIURETIC PEPTIDE: B Natriuretic Peptide: 29.4 pg/mL (ref 0.0–100.0)

## 2022-10-29 LAB — CBG MONITORING, ED
Glucose-Capillary: 81 mg/dL (ref 70–99)
Glucose-Capillary: 102 mg/dL — ABNORMAL HIGH (ref 70–99)

## 2022-10-29 MED ORDER — LINACLOTIDE 72 MCG PO CAPS
72.0000 ug | ORAL_CAPSULE | Freq: Every day | ORAL | Status: DC
  Filled 2022-10-29 (×2): qty 1

## 2022-10-29 MED ORDER — SODIUM CHLORIDE 0.9 % IV SOLN: 250.0000 mL | INTRAVENOUS | Status: DC | PRN

## 2022-10-29 MED ORDER — PREDNISONE 20 MG PO TABS
50.0000 mg | ORAL_TABLET | Freq: Four times a day (QID) | ORAL | Status: AC
  Administered 2022-10-30 (×3): 50 mg via ORAL
  Filled 2022-10-29: qty 1
  Filled 2022-10-29: qty 2
  Filled 2022-10-29: qty 1

## 2022-10-29 MED ORDER — ASPIRIN 81 MG PO CHEW
324.0000 mg | CHEWABLE_TABLET | Freq: Once | ORAL | Status: AC
  Administered 2022-10-29: 324 mg via ORAL
  Filled 2022-10-29: qty 4

## 2022-10-29 MED ORDER — SODIUM CHLORIDE 0.9% FLUSH: 3.0000 mL | INTRAVENOUS | Status: DC | PRN

## 2022-10-29 MED ORDER — CYCLOBENZAPRINE HCL 10 MG PO TABS
10.0000 mg | ORAL_TABLET | Freq: Three times a day (TID) | ORAL | Status: DC | PRN
  Administered 2022-10-30 (×2): 10 mg via ORAL
  Filled 2022-10-29 (×2): qty 1

## 2022-10-29 MED ORDER — HEPARIN SODIUM (PORCINE) 5000 UNIT/ML IJ SOLN
5000.0000 [IU] | Freq: Three times a day (TID) | INTRAMUSCULAR | Status: DC
  Administered 2022-10-29 – 2022-10-30 (×2): 5000 [IU] via SUBCUTANEOUS
  Filled 2022-10-29 (×2): qty 1

## 2022-10-29 MED ORDER — INSULIN ASPART 100 UNIT/ML IJ SOLN
0.0000 [IU] | Freq: Three times a day (TID) | INTRAMUSCULAR | Status: DC
  Administered 2022-10-30: 2 [IU] via SUBCUTANEOUS

## 2022-10-29 MED ORDER — DIPHENHYDRAMINE HCL 50 MG/ML IJ SOLN
50.0000 mg | Freq: Once | INTRAMUSCULAR | Status: AC
  Administered 2022-10-30: 50 mg via INTRAVENOUS
  Filled 2022-10-29: qty 1

## 2022-10-29 MED ORDER — SODIUM CHLORIDE 0.9% FLUSH: 3.0000 mL | Freq: Two times a day (BID) | INTRAVENOUS | Status: DC

## 2022-10-29 MED ORDER — MORPHINE SULFATE (PF) 4 MG/ML IV SOLN
4.0000 mg | Freq: Once | INTRAVENOUS | Status: AC
  Administered 2022-10-29: 4 mg via INTRAVENOUS
  Filled 2022-10-29: qty 1

## 2022-10-29 MED ORDER — IRBESARTAN 300 MG PO TABS
300.0000 mg | ORAL_TABLET | Freq: Every day | ORAL | Status: DC
  Administered 2022-10-30: 300 mg via ORAL
  Filled 2022-10-29: qty 1

## 2022-10-29 MED ORDER — HYOSCYAMINE SULFATE 0.125 MG SL SUBL: 0.1250 mg | SUBLINGUAL_TABLET | Freq: Three times a day (TID) | SUBLINGUAL | Status: DC | PRN

## 2022-10-29 MED ORDER — ASPIRIN 81 MG PO TBEC
81.0000 mg | DELAYED_RELEASE_TABLET | Freq: Every day | ORAL | Status: DC
  Filled 2022-10-29: qty 1

## 2022-10-29 MED ORDER — ASPIRIN 81 MG PO CHEW
81.0000 mg | CHEWABLE_TABLET | ORAL | Status: AC
  Administered 2022-10-30: 81 mg via ORAL
  Filled 2022-10-29: qty 1

## 2022-10-29 MED ORDER — LEVOTHYROXINE SODIUM 25 MCG PO TABS
137.0000 ug | ORAL_TABLET | Freq: Every day | ORAL | Status: DC
  Administered 2022-10-30: 137 ug via ORAL
  Filled 2022-10-29: qty 1

## 2022-10-29 MED ORDER — PANTOPRAZOLE SODIUM 40 MG PO TBEC
40.0000 mg | DELAYED_RELEASE_TABLET | Freq: Every day | ORAL | Status: DC
  Administered 2022-10-30: 40 mg via ORAL
  Filled 2022-10-29: qty 1

## 2022-10-29 MED ORDER — NITROGLYCERIN 0.4 MG SL SUBL: 0.4000 mg | SUBLINGUAL_TABLET | SUBLINGUAL | Status: DC | PRN

## 2022-10-29 MED ORDER — ONDANSETRON HCL 4 MG/2ML IJ SOLN
4.0000 mg | Freq: Four times a day (QID) | INTRAMUSCULAR | Status: DC | PRN
  Administered 2022-10-30: 4 mg via INTRAVENOUS

## 2022-10-29 MED ORDER — DIPHENHYDRAMINE HCL 25 MG PO CAPS: 50.0000 mg | ORAL_CAPSULE | Freq: Once | ORAL | Status: AC

## 2022-10-29 MED ORDER — ROSUVASTATIN CALCIUM 20 MG PO TABS
20.0000 mg | ORAL_TABLET | Freq: Every day | ORAL | Status: DC
  Administered 2022-10-30: 20 mg via ORAL
  Filled 2022-10-29: qty 1

## 2022-10-29 MED ORDER — ACETAMINOPHEN 500 MG PO TABS
1000.0000 mg | ORAL_TABLET | Freq: Once | ORAL | Status: AC
  Administered 2022-10-29: 1000 mg via ORAL
  Filled 2022-10-29 (×2): qty 2

## 2022-10-29 MED ORDER — POLYETHYLENE GLYCOL 3350 17 G PO PACK: 17.0000 g | PACK | Freq: Every day | ORAL | Status: DC | PRN

## 2022-10-29 MED ORDER — SODIUM CHLORIDE 0.9 % WEIGHT BASED INFUSION
1.0000 mL/kg/h | INTRAVENOUS | Status: DC
  Administered 2022-10-30: 1 mL/kg/h via INTRAVENOUS

## 2022-10-29 MED ORDER — SODIUM CHLORIDE 0.9 % WEIGHT BASED INFUSION: 3.0000 mL/kg/h | INTRAVENOUS | Status: DC

## 2022-10-29 NOTE — Telephone Encounter (Signed)
Returned call to pt. She is wanting her test results. She had a Mining engineer. Pt is having hip replacement next month and the second one 3 months later. She does not understand what the results mean. Her chest is hurting all weekend non stop. Current chest pain but no pressure and radiates out to both breasts. Feels like something is pushing up. No lightheadedness, dizziness, sob or blurred visit. Has had radiation to her left arm. BP yesterday was 132/80 and today it is 167/93 HR 95. This nurse recommended pt go to the ER with the continued symptoms she is having.  Spoke with DOD Dr. Bjorn Pippin and he is in agreement.

## 2022-10-29 NOTE — H&P (Addendum)
Cardiology Admission History and Physical   Patient ID: Jillian Jillian [name] MRN: [id number]; DOB: 11/12/1945   Admission date: 10/29/2022  PCP:  Jillian Paradise, MD   [facility] Providers Cardiologist:  Jillian Harps, MD        Chief Complaint:  chest pain  Patient Profile:   Jillian Jillian [name] is a 77 y.o. female with DM, HTN, HLD, obesity, coronary calcification seen on CT, contrast dye allergy, IBS, hypothyroidism, anxiety who is being seen 10/29/2022 for the evaluation of chest pain.  History of Present Illness:   Ms. Jillian [name] recently saw Dr. Flora [name] for evaluation of episodic chest tightness that occurred primarily with sitting. However, she also had exertional dyspnea. EKG showed NSR with nonspecific STTW changes. She has a family history of heart disease as her father had an MI. She is scheduled to undergo hip surgery 12/25/22. Prior echo 12/2021 showed EF 60-65%, G1DD, ULN ascending aorta 3.78cm. 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 Coronary calcifications were present in the LAD. Dr. Flora [name] reviewed the study and felt it Jillian [name] reassuring. She called the office today requesting her results and reporting continued chest pain. She Jillian [name] also reporting chest pain happening constantly all weekend that radiated to both breasts and left arm, feeling like something Jillian [name] pushing up into her chest. She denied SOB, lightheadedness, syncope. BP Jillian [name] elevated to the 160s. She Jillian [name] subsequently recommended to come to the ER. Here she is very anxious about the idea of requiring further workup. EKG shows NSR with nonspecific STTW changes similar to 8/2 tracing. Labwork reassuring, first troponin negative. Last BP 147/71. She took her home meds this AM. She had an old rx on  her med list for PRN prednisone and confirms she hasn't taken this in a few months. Baseline HR looks to be in the 90s. ED ordered 324mg  ASA.  Past Medical History:  Diagnosis Date   Allergy    Anxiety    Bowel obstruction (HCC)    Cataract    Diabetes mellitus without complication (HCC)    Diverticulitis    GERD (gastroesophageal reflux disease)    Gout    High cholesterol    Hypertension    Hypothyroidism    IBS (irritable bowel syndrome)    Neck pain    Spondylosis of lumbar spine     Past Surgical History:  Procedure Laterality Date   ABDOMINAL HYSTERECTOMY     APPENDECTOMY     BREAST BIOPSY     unsure which breast   CARDIAC CATHETERIZATION     x 2   CHOLECYSTECTOMY     EXPLORATORY LAPAROTOMY     HERNIA REPAIR     RADIOFREQUENCY ABLATION NERVES  03/2020   on back   TONSILLECTOMY     WRIST FRACTURE SURGERY Right      Medications Prior to Admission: Prior to Admission medications   Medication Sig Start Date End Date Taking? Authorizing Provider  cyclobenzaprine (FLEXERIL) 10 MG tablet Take 10 mg by mouth 3 (three) times daily as needed for muscle spasms.    [provider]  diphenhydrAMINE (BENADRYL) 50 MG tablet Pt to take 50 mg of prednisone on 08/11/22 at 8:50 pm, 50 mg of prednisone on 08/12/22 at 2:50 am, and 50 mg of prednisone on 08/12/22 at 8:50 am. Pt  is also to take 50 mg of benadryl on 08/12/22 at 8:50 am. Please call [phone] with any questions before Saturday. 08/07/22   Sterling Big, MD  EUTHYROX 137 MCG tablet Take 137 mcg by mouth every morning.    [provider]  Famotidine (PEPCID PO) Take by mouth as needed.    [provider]  hyoscyamine (LEVSIN SL) 0.125 MG SL tablet Place 1 tablet (0.125 mg total) under the tongue every 8 (eight) hours as needed. 06/27/22   Zehr, Princella Pellegrini, PA-C  ibuprofen (ADVIL,MOTRIN) 200 MG tablet Take 200 mg by mouth 3 (three) times daily as needed for moderate pain.    [provider]  linaclotide Karlene Einstein) 72 MCG capsule Take 1 capsule (72 mcg total) by mouth daily before breakfast. 06/27/22   Zehr, Princella Pellegrini, PA-C  metFORMIN (GLUCOPHAGE-XR) 500 MG 24 hr tablet Take 1,000 mg by mouth 2 (two) times daily.    [provider]  polyethylene glycol powder (GLYCOLAX/MIRALAX) 17 GM/SCOOP powder Take 1 dose once to twice daily by mouth in 8 ounces of fluid Patient taking differently: Take 1 dose once to twice daily by mouth in 8 ounces of fluid prn 12/23/19   Esterwood, Amy S, PA-C  predniSONE (DELTASONE) 50 MG tablet Use as directed 08/07/22   Zehr, Shanda Bumps D, PA-C  rosuvastatin (CRESTOR) 20 MG tablet Take 20 mg by mouth daily.    [provider]  telmisartan (MICARDIS) 80 MG tablet Take 1 tablet by mouth daily before breakfast. 10/02/16   [provider]     Allergies:    Allergies  Allergen Reactions   Hydrocodone-Acetaminophen Itching   Oxycodone Itching   Metronidazole Nausea And Vomiting    Flu like symptoms Flu like symptoms   Nortriptyline Other (See Comments)   Sulfamethoxazole Other (See Comments)    Flu-like symptoms.   Amoxicillin     Numbness   Iodinated Contrast Media     rash   Luminal [Phenobarbital] Other (See Comments)    Blisters   Mobic [Meloxicam]    Penicillins     Has patient had a PCN reaction causing immediate rash, facial/tongue/throat swelling, SOB or lightheadedness with hypotension:Y Has patient had a PCN reaction causing severe rash involving mucus membranes or skin necrosis: N Has patient had a PCN reaction that required hospitalization: N Has patient had a PCN reaction occurring within the last 10 years: N If all of the above answers are "NO", then may proceed with Cephalosporin use.    Tramadol Other (See Comments)   Allopurinol Rash   Ciprofloxacin Rash   Doxycycline Rash   Latex Rash    Social History:   Social History   Socioeconomic History   Marital status: Married    Spouse name:  Not on file   Number of children: 1   Years of education: Not on file   Highest education level: Not on file  Occupational History   Occupation: Retired  Tobacco Use   Smoking status: Never   Smokeless tobacco: Never  Vaping Use   Vaping status: Never Used  Substance and Sexual Activity   Alcohol use: Yes    Comment: occasional   Drug use: Never   Sexual activity: Yes  Other Topics Concern   Not on file  Social History Narrative   Not on file   Social Determinants of Health   Financial Resource Strain: Not on file  Food Insecurity: No Food Insecurity (05/10/2020)   Received from [facility]   Hunger Vital Sign    Worried About Running Out of Food in the Last Year: Never true    Ran Out of Food in the Last Year: Never true  Transportation Needs: Not on file  Physical Activity: Not on file  Stress: Stress Concern Present (09/26/2020)   Received from [facility], [facility] - Occupational Stress Questionnaire    Feeling of Stress : To some extent  Social Connections: Unknown (07/06/2021)   Received from [facility]   Social Network    Social Network: Not on file  Intimate Partner Violence: Unknown (05/29/2021)   Received from [facility], [facility]   HITS    Physically Hurt: Not on file    Insult or Talk Down To: Not on file    Threaten Physical Harm: Not on file    Scream or Curse: Not on file    Family History:   The patient's family history includes Breast cancer in her maternal aunt; Colon cancer in her paternal grandmother; Heart attack in her father.    ROS:  Please see the history of present illness.  All other ROS reviewed and negative.     Physical Exam/Data:   Vitals:   10/29/22 1430 10/29/22 1500 10/29/22 1530 10/29/22 1605  BP: (!) 165/83 (!) 163/77 (!) 163/79 (!) 147/71  Pulse: 87 90 89 83  Resp: 14 10 16 15   Temp:      TempSrc:      SpO2: 98% 98% 100% 96%   Weight:      Height:       No intake or output data in the 24 hours ending 10/29/22 1628    10/29/2022   12:06 PM 09/27/2022    2:26 PM 08/07/2022    1:27 PM  Last 3 Weights  Weight (lbs) 196 lb 198 lb 12.8 oz 194 lb  Weight (kg) 88.905 kg 90.175 kg 87.998 kg     Body mass index is 33.64 kg/m.  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  Normal myocardial blood flow reserve globally and in LAD territory. Moderate LAD calcifications on CT. No high risk findings such as TID or drop in EF with stress.  Overall, study is low risk, as area of ischemia is small   EKG not recorded during study   LV perfusion is abnormal. There is evidence of ischemia. There is evidence of infarction. Defect 1: There is a small defect with moderate reduction in uptake present in the apical inferior and apex location(s) that is partially reversible. There is abnormal wall motion in the defect area. Consistent with peri-infarct ischemia.   Rest left ventricular function is normal. Rest EF: 63%. Stress left ventricular function is normal. Stress EF: 72%. End diastolic cavity size is normal. End systolic cavity size is normal.   Myocardial blood flow Jillian [name] computed to be 2.35ml/g/min at rest and 4.64ml/g/min at stress. Global myocardial blood flow reserve Jillian [name] 2.38 and Jillian [name] normal.   Coronary calcium Jillian [name] present on the attenuation correction CT images. Moderate coronary calcifications were present. Coronary calcifications were present in the left anterior descending artery distribution(s).  Findings are consistent with infarction with peri-infarct ischemia. The study is low risk.   Electronically signed by  Epifanio Lesches, MD  Laboratory Data:  High Sensitivity Troponin:   Recent Labs  Lab 10/29/22 1219  TROPONINIHS 5      Chemistry Recent Labs  Lab 10/29/22 1219  NA 141  K 4.2  CL 107  CO2 22  GLUCOSE 102*  BUN 17  CREATININE 0.91  CALCIUM 9.9  GFRNONAA >60  ANIONGAP 12    No results for input(s): "PROT", "ALBUMIN", "AST", "ALT", "ALKPHOS", "BILITOT" in the last 168 hours. Lipids No results for input(s): "CHOL", "TRIG", "HDL", "LABVLDL", "LDLCALC", "CHOLHDL" in the last 168 hours. Hematology Recent Labs  Lab 10/29/22 1219  WBC 8.4  RBC 4.33  HGB 12.3  HCT 38.9  MCV 89.8  MCH 28.4  MCHC 31.6  RDW 14.0  PLT 292   Thyroid No results for input(s): "TSH", "FREET4" in the last 168 hours. BNPNo results for input(s): "BNP", "PROBNP" in the last 168 hours.  DDimer No results for input(s): "DDIMER" in the last 168 hours.   Radiology/Studies:  DG Chest 2 View  Result Date: 10/29/2022 CLINICAL DATA:  Chest pain EXAM: CHEST - 2 VIEW COMPARISON:  None Available. FINDINGS: The heart size and mediastinal contours are within normal limits. Calcified aorta. Hyperinflation. No consolidation, pneumothorax or effusion. No edema. The visualized skeletal structures are unremarkable. Surgical clips in the upper abdomen. IMPRESSION: Hyperinflation.  No acute cardiopulmonary disease. Electronically Signed   By: Karen Kays M.D.   On: 10/29/2022 14:52     Assessment and Plan:   1. Chest pain, somewhat atypical, with coronary artery calcification - initial troponin negative despite fairly pervasive symptoms - stress test findings above; Dr. Flora [name] reviewed and felt study Jillian [name] reassuring - 2D echo 12/2021 with normal LVEF - given patient's ongoing complaints of chest pain, anticipating more definitive cath - will need pre-med prep protocol for contrast allergy - I asked pharmacist to help adjust timing in [facility] for 1330 case - Dr Jillian Jillian [name] discussed procedure/consent with patient -  continue new ASA - continue home rosuvastatin; check lipids in AM and titrate if indicated - hold off IV heparin unless next troponin is positive - add PPI  2. Hypothyroidism - check TSH with AM labs and continue home thyroid regimen  3. HTN - follow BP on home regimen (subbing formulary ARB for telmisartan) - anticipate further med titration based on cath results, some component driven by anxiety today (reports SBP 130s yesterday)  4. DM - hold metformin and add SSI   Risk Assessment/Risk Scores:    Code Status: Full Code  Severity of Illness: The appropriate patient status for this patient is OBSERVATION. Observation status is judged to be reasonable and necessary in order to provide the required intensity of service to ensure the patient's safety. The patient's presenting symptoms, physical exam findings, and initial radiographic and laboratory data in the context of their medical condition is felt to place them at decreased risk for further clinical deterioration. Furthermore, it is anticipated that the patient will be medically stable for discharge from the hospital within 2 midnights of admission.    For questions or updates, please contact [facility] Please consult [email or web address] for contact info under     Signed, Laurann Montana, PA-C  10/29/2022 4:28 PM

## 2022-10-29 NOTE — ED Triage Notes (Addendum)
Pt c/o squeezing central chest and LA pain x several days, worsening, and bilateral LE swelling; bp at home 168/92; endorses nausea, denies sob; pain worse with movement; hx DM, HTN

## 2022-10-29 NOTE — Telephone Encounter (Signed)
Patient is calling about her results.

## 2022-10-29 NOTE — ED Provider Notes (Signed)
[place] EMERGENCY DEPARTMENT AT [facility] Provider Note   CSN: [id number] Arrival date & time: 10/29/22  1145     History  Chief Complaint  Patient presents with   Chest Pain   Leg Swelling    Jillian [name] is a 77 y.o. female.  The history is provided by the patient, medical records and the spouse. No language interpreter was used.  Chest Pain    Jillian [name] is a 77 year-old female with a history significant for diabetes, hypertension, hypercholesteremia, and hypothryoidism presenting to the ED with a complaint of chest pain. Patient reports chest pain started 5 days ago after undergoing a nuclear stress test following an abnormal CTA. Pain today prompted her to reach out to her cardiology office, who instructed her to come here. She describes the pain as intermittent and aching, starting in the center of her chest and radiating out towards her arms. Pain is 8/10 at worse. She endorses current chest pain rated as 2/10. She cannot pinpoint any activities that make the pain worse. She denies any fever, chills, diaphoresis, shortness of breath, or edema.   Home Medications Prior to Admission medications   Medication Sig Start Date End Date Taking? Authorizing Provider  cyclobenzaprine (FLEXERIL) 10 MG tablet Take 10 mg by mouth 3 (three) times daily as needed for muscle spasms.    [provider]  diphenhydrAMINE (BENADRYL) 50 MG tablet Pt to take 50 mg of prednisone on 08/11/22 at 8:50 pm, 50 mg of prednisone on 08/12/22 at 2:50 am, and 50 mg of prednisone on 08/12/22 at 8:50 am. Pt is also to take 50 mg of benadryl on 08/12/22 at 8:50 am. Please call [phone] with any questions before Saturday. 08/07/22   Sterling Big, MD  EUTHYROX 137 MCG tablet Take 137 mcg by mouth every morning.    [provider]  Famotidine (PEPCID PO) Take by mouth as needed.    [provider]  hyoscyamine (LEVSIN SL) 0.125 MG SL tablet Place 1 tablet  (0.125 mg total) under the tongue every 8 (eight) hours as needed. 06/27/22   Zehr, Princella Pellegrini, PA-C  ibuprofen (ADVIL,MOTRIN) 200 MG tablet Take 200 mg by mouth 3 (three) times daily as needed for moderate pain.    [provider]  linaclotide Karlene Einstein) 72 MCG capsule Take 1 capsule (72 mcg total) by mouth daily before breakfast. 06/27/22   Zehr, Princella Pellegrini, PA-C  metFORMIN (GLUCOPHAGE-XR) 500 MG 24 hr tablet Take 1,000 mg by mouth 2 (two) times daily.    [provider]  polyethylene glycol powder (GLYCOLAX/MIRALAX) 17 GM/SCOOP powder Take 1 dose once to twice daily by mouth in 8 ounces of fluid Patient taking differently: Take 1 dose once to twice daily by mouth in 8 ounces of fluid prn 12/23/19   Esterwood, Amy S, PA-C  predniSONE (DELTASONE) 50 MG tablet Use as directed 08/07/22   Zehr, Shanda Bumps D, PA-C  rosuvastatin (CRESTOR) 20 MG tablet Take 20 mg by mouth daily.    [provider]  telmisartan (MICARDIS) 80 MG tablet Take 1 tablet by mouth daily before breakfast. 10/02/16   [provider]      Allergies    Hydrocodone-acetaminophen, Oxycodone, Metronidazole, Nortriptyline, Sulfamethoxazole, Amoxicillin, Iodinated contrast media, Luminal [phenobarbital], Mobic [meloxicam], Penicillins, Tramadol, Allopurinol, Ciprofloxacin, Doxycycline, and Latex    Review of Systems   Review of Systems  Cardiovascular:  Positive for chest pain.  All other systems reviewed and are negative.   Physical  Exam Updated Vital Signs BP (!) 165/83   Pulse 87   Temp 98.1 F (36.7 C) (Oral)   Resp 14   Ht 5\' 4"  (1.626 m)   Wt 88.9 kg   SpO2 98%   BMI 33.64 kg/m  Physical Exam Vitals and nursing note reviewed.  Constitutional:      General: She is not in acute distress.    Appearance: She is well-developed.  HENT:     Head: Atraumatic.  Eyes:     Conjunctiva/sclera: Conjunctivae normal.  Neck:     Comments: No JVD Cardiovascular:     Rate and Rhythm: Normal rate and  regular rhythm.     Pulses: Normal pulses.     Heart sounds: Normal heart sounds.  Pulmonary:     Effort: Pulmonary effort is normal.  Abdominal:     Palpations: Abdomen is soft.     Tenderness: There is no abdominal tenderness.  Musculoskeletal:     Cervical back: Neck supple.     Right lower leg: Edema present.     Left lower leg: Edema present.  Skin:    Findings: No rash.  Neurological:     Mental Status: She is alert.  Psychiatric:        Mood and Affect: Mood normal.     ED Results / Procedures / Treatments   Labs (all labs ordered are listed, but only abnormal results are displayed) Labs Reviewed  BASIC METABOLIC PANEL - Abnormal; Notable for the following components:      Result Value   Glucose, Bld 102 (*)    All other components within normal limits  CBC  BRAIN NATRIURETIC PEPTIDE  TROPONIN I (HIGH SENSITIVITY)  TROPONIN I (HIGH SENSITIVITY)    EKG EKG Interpretation Date/Time:  Tuesday October 29 2022 12:05:32 EDT Ventricular Rate:  98 PR Interval:  140 QRS Duration:  90 QT Interval:  360 QTC Calculation: 459 R Axis:   26  Text Interpretation: Normal sinus rhythm Nonspecific ST and T wave abnormality Abnormal ECG When compared with ECG of 27-Sep-2022 14:30, PREVIOUS ECG IS PRESENT Confirmed by Alvino Blood ([id number]) on 10/29/2022 3:51:23 PM  Radiology DG Chest 2 View  Result Date: 10/29/2022 CLINICAL DATA:  Chest pain EXAM: CHEST - 2 VIEW COMPARISON:  None Available. FINDINGS: The heart size and mediastinal contours are within normal limits. Calcified aorta. Hyperinflation. No consolidation, pneumothorax or effusion. No edema. The visualized skeletal structures are unremarkable. Surgical clips in the upper abdomen. IMPRESSION: Hyperinflation.  No acute cardiopulmonary disease. Electronically Signed   By: Karen Kays M.D.   On: 10/29/2022 14:52    Procedures Procedures    Medications Ordered in ED Medications  nitroGLYCERIN (NITROSTAT) SL tablet  0.4 mg (has no administration in time range)  insulin aspart (novoLOG) injection 0-9 Units (has no administration in time range)  aspirin chewable tablet 324 mg (324 mg Oral Given 10/29/22 1616)    ED Course/ Medical Decision Making/ A&P                                 Medical Decision Making Amount and/or Complexity of Data Reviewed Labs: ordered. Radiology: ordered.  Risk OTC drugs. Prescription drug management.   BP (!) 163/79   Pulse 89   Temp 98.1 F (36.7 C) (Oral)   Resp 16   Ht 5\' 4"  (1.626 m)   Wt 88.9 kg   SpO2 100%   BMI 33.64  kg/m   3:41 PM This is a 77 year old female significant history of hypertension, diabetes, hyperlipidemia, thyroid disease, presenting with complaints of chest pain.  Patient report last week she had a CTA of her abdomen pelvis due to having recurrent abdominal pain.  Incidentally they noted that she has evidence of calcification in her coronary vessels and patient was scheduled for a nuclear medicine study of her heart.  She had it done 5 days ago and shortly after the procedure she noted to have some chest discomfort.  Since then she has had intermittent discomfort which she described more as a pressure sensation that spread across her chest that happen sporadically and not related to exertion.  It has been intermittent and she has been taking aspirin to help relieve it.  She also received the report of her nuclear medicine scan through MyChart and she noted that it mention signs of infarct which concerns her.  She has not had a formal conversation with her cardiologist about this yet.  She still endorsed intermittent chest discomfort and she reach out to her cardiologist office today and was instructed to come to ER for further evaluation.  At this time she mention very minimal chest discomfort and rates as 2 out of 10.  Pain is not associate with lightheadedness, dizziness, nausea, diaphoresis, shortness of breath, arm pain.  She denies any history  of alcohol or tobacco use.  She does mention that her dad died from a heart attack which concerns her after she read this report.  On exam this is an extremely well-appearing obese female resting comfortably in bed appears to be in no acute discomfort.  Heart with normal rate and rhythm, lungs are clear to auscultation bilaterally abdomen is soft nontender she has trace edema noted to her bilateral lower extremities.  She has some mild tenderness to palpation of anterior chest wall without any overlying skin changes.  Vital signs notable for elevated blood pressure of 165/83.  Patient is afebrile no hypoxia.  -Labs ordered, independently viewed and interpreted by me.  Labs remarkable for normal trop, electrolytes are reassuring -The patient was maintained on a cardiac monitor.  I personally viewed and interpreted the cardiac monitored which showed an underlying rhythm of: NSR -Imaging independently viewed and interpreted by me and I agree with radiologist's interpretation.  Result remarkable for CXR without acute changes -This patient presents to the ED for concern of CP, this involves an extensive number of treatment options, and is a complaint that carries with it a high risk of complications and morbidity.  The differential diagnosis includes ACS, GERD, gastritis, MSK, PNA, PE, dissection, anxiety -Co morbidities that complicate the patient evaluation includes DM, HTN, HLD, -Treatment includes ASA, SL nitro -Reevaluation of the patient after these medicines showed that the patient improved -PCP office notes or outside notes reviewed -Discussion with specialist cardiology who have seen and will admit pt for heart cath tomorrow -Escalation to admission/observation considered: patient is agreeable with admission.   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  4:03 PM Appreciate consultation from cardiology team and I spoke with cardmaster Rosann Auerbach, who will request cardiology team to see and evaluate patient in the ER and determine disposition        Final Clinical Impression(s) / ED Diagnoses Final diagnoses:  Angina pectoris (HCC)  Rx / DC Orders ED Discharge Orders     None         Fayrene Helper, PA-C 10/29/22 1635    Lonell Grandchild, MD 10/29/22 2006

## 2022-10-29 NOTE — ED Notes (Signed)
Lab made aware of BNP add-on.  ?

## 2022-10-30 ENCOUNTER — Encounter (HOSPITAL_COMMUNITY)
Admission: EM | Disposition: A | Discharge status: Home / Self Care | Payer: Self-pay | Source: Home / Self Care | Attending: Emergency Medicine

## 2022-10-30 DIAGNOSIS — I25110 Atherosclerotic heart disease of native coronary artery with unstable angina pectoris: Secondary | ICD-10-CM | POA: Diagnosis not present

## 2022-10-30 DIAGNOSIS — Z7984 Long term (current) use of oral hypoglycemic drugs: Secondary | ICD-10-CM | POA: Diagnosis not present

## 2022-10-30 DIAGNOSIS — I200 Unstable angina: Secondary | ICD-10-CM | POA: Diagnosis not present

## 2022-10-30 DIAGNOSIS — I2510 Atherosclerotic heart disease of native coronary artery without angina pectoris: Secondary | ICD-10-CM | POA: Insufficient documentation

## 2022-10-30 DIAGNOSIS — Z79899 Other long term (current) drug therapy: Secondary | ICD-10-CM | POA: Diagnosis not present

## 2022-10-30 DIAGNOSIS — E039 Hypothyroidism, unspecified: Secondary | ICD-10-CM | POA: Diagnosis not present

## 2022-10-30 HISTORY — PX: CORONARY PRESSURE/FFR STUDY: CATH118243

## 2022-10-30 HISTORY — PX: LEFT HEART CATH AND CORONARY ANGIOGRAPHY: CATH118249

## 2022-10-30 LAB — COMPREHENSIVE METABOLIC PANEL
ALT: 13 U/L (ref 0–44)
AST: 24 U/L (ref 15–41)
Albumin: 3.9 g/dL (ref 3.5–5.0)
Alkaline Phosphatase: 60 U/L (ref 38–126)
Anion gap: 14 (ref 5–15)
BUN: 15 mg/dL (ref 8–23)
CO2: 19 mmol/L — ABNORMAL LOW (ref 22–32)
Calcium: 9.5 mg/dL (ref 8.9–10.3)
Chloride: 107 mmol/L (ref 98–111)
Creatinine, Ser: 0.88 mg/dL (ref 0.44–1.00)
GFR, Estimated: >60 mL/min (ref >60)
Glucose, Bld: 106 mg/dL — ABNORMAL HIGH (ref 70–99)
Potassium: 3.8 mmol/L (ref 3.5–5.1)
Sodium: 140 mmol/L (ref 135–145)
Total Bilirubin: 0.9 mg/dL (ref 0.3–1.2)
Total Protein: 7 g/dL (ref 6.5–8.1)

## 2022-10-30 LAB — LIPID PANEL
Cholesterol: 122 mg/dL (ref 0–200)
HDL: 55 mg/dL (ref >40)
LDL Cholesterol: 49 mg/dL (ref 0–99)
Total CHOL/HDL Ratio: 2.2 ratio
Triglycerides: 90 mg/dL (ref <150)
VLDL: 18 mg/dL (ref 0–40)

## 2022-10-30 LAB — GLUCOSE, CAPILLARY: Glucose-Capillary: 153 mg/dL — ABNORMAL HIGH (ref 70–99)

## 2022-10-30 LAB — CBC
HCT: 36.2 % (ref 36.0–46.0)
Hemoglobin: 11.6 g/dL — ABNORMAL LOW (ref 12.0–15.0)
MCH: 28.6 pg (ref 26.0–34.0)
MCHC: 32 g/dL (ref 30.0–36.0)
MCV: 89.4 fL (ref 80.0–100.0)
Platelets: 245 10*3/uL (ref 150–400)
RBC: 4.05 MIL/uL (ref 3.87–5.11)
RDW: 13.9 % (ref 11.5–15.5)
WBC: 8.5 10*3/uL (ref 4.0–10.5)
nRBC: 0 % (ref 0.0–0.2)

## 2022-10-30 LAB — POCT ACTIVATED CLOTTING TIME: Activated Clotting Time: 379 s

## 2022-10-30 LAB — HEMOGLOBIN A1C
Hgb A1c MFr Bld: 6.6 % — ABNORMAL HIGH (ref 4.8–5.6)
Mean Plasma Glucose: 142.72 mg/dL

## 2022-10-30 LAB — TSH: TSH: 0.877 u[IU]/mL (ref 0.350–4.500)

## 2022-10-30 SURGERY — LEFT HEART CATH AND CORONARY ANGIOGRAPHY
Anesthesia: LOCAL

## 2022-10-30 MED ORDER — LABETALOL HCL 5 MG/ML IV SOLN
INTRAVENOUS | Status: DC | PRN
  Administered 2022-10-30: 10 mg via INTRAVENOUS

## 2022-10-30 MED ORDER — CARVEDILOL 3.125 MG PO TABS
3.1250 mg | ORAL_TABLET | Freq: Two times a day (BID) | ORAL | Status: DC
  Administered 2022-10-30: 3.125 mg via ORAL
  Filled 2022-10-30: qty 1

## 2022-10-30 MED ORDER — SODIUM CHLORIDE 0.9 % IV SOLN: 250.0000 mL | INTRAVENOUS | Status: DC | PRN

## 2022-10-30 MED ORDER — HEPARIN (PORCINE) IN NACL 1000-0.9 UT/500ML-% IV SOLN
INTRAVENOUS | Status: DC | PRN
  Administered 2022-10-30 (×2): 500 mL

## 2022-10-30 MED ORDER — FENTANYL CITRATE (PF) 100 MCG/2ML IJ SOLN
INTRAMUSCULAR | Status: DC | PRN
  Administered 2022-10-30: 25 ug via INTRAVENOUS
  Administered 2022-10-30: 50 ug via INTRAVENOUS

## 2022-10-30 MED ORDER — LIDOCAINE HCL (PF) 1 % IJ SOLN
INTRAMUSCULAR | Status: DC | PRN
  Administered 2022-10-30: 2 mL

## 2022-10-30 MED ORDER — CYCLOBENZAPRINE HCL 10 MG PO TABS: 10.0000 mg | ORAL_TABLET | Freq: Once | ORAL | Status: DC

## 2022-10-30 MED ORDER — SODIUM CHLORIDE 0.9% FLUSH: 3.0000 mL | INTRAVENOUS | Status: DC | PRN

## 2022-10-30 MED ORDER — LIDOCAINE HCL (PF) 1 % IJ SOLN
INTRAMUSCULAR | Status: AC
  Filled 2022-10-30: qty 30

## 2022-10-30 MED ORDER — HEPARIN SODIUM (PORCINE) 1000 UNIT/ML IJ SOLN
INTRAMUSCULAR | Status: DC | PRN
  Administered 2022-10-30 (×2): 4500 [IU] via INTRAVENOUS

## 2022-10-30 MED ORDER — LABETALOL HCL 5 MG/ML IV SOLN
INTRAVENOUS | Status: AC
  Filled 2022-10-30: qty 4

## 2022-10-30 MED ORDER — LABETALOL HCL 5 MG/ML IV SOLN: 10.0000 mg | INTRAVENOUS | Status: AC | PRN

## 2022-10-30 MED ORDER — CARVEDILOL 3.125 MG PO TABS: 3.1250 mg | ORAL_TABLET | Freq: Two times a day (BID) | ORAL | 5 refills | Status: DC

## 2022-10-30 MED ORDER — VERAPAMIL HCL 2.5 MG/ML IV SOLN
INTRAVENOUS | Status: AC
  Filled 2022-10-30: qty 2

## 2022-10-30 MED ORDER — MIDAZOLAM HCL 2 MG/2ML IJ SOLN
INTRAMUSCULAR | Status: DC | PRN
  Administered 2022-10-30 (×2): 1 mg via INTRAVENOUS

## 2022-10-30 MED ORDER — HEPARIN SODIUM (PORCINE) 1000 UNIT/ML IJ SOLN
INTRAMUSCULAR | Status: AC
  Filled 2022-10-30: qty 10

## 2022-10-30 MED ORDER — METOPROLOL TARTRATE 25 MG PO TABS
25.0000 mg | ORAL_TABLET | Freq: Two times a day (BID) | ORAL | Status: DC
Start: 2022-10-30 — End: 2022-10-30

## 2022-10-30 MED ORDER — SODIUM CHLORIDE 0.9 % IV SOLN: INTRAVENOUS | Status: AC

## 2022-10-30 MED ORDER — FENTANYL CITRATE (PF) 100 MCG/2ML IJ SOLN
INTRAMUSCULAR | Status: AC
  Filled 2022-10-30: qty 2

## 2022-10-30 MED ORDER — MIDAZOLAM HCL 2 MG/2ML IJ SOLN
INTRAMUSCULAR | Status: AC
  Filled 2022-10-30: qty 2

## 2022-10-30 MED ORDER — SODIUM CHLORIDE 0.9% FLUSH: 3.0000 mL | Freq: Two times a day (BID) | INTRAVENOUS | Status: DC

## 2022-10-30 MED ORDER — VERAPAMIL HCL 2.5 MG/ML IV SOLN
INTRAVENOUS | Status: DC | PRN
  Administered 2022-10-30: 10 mL via INTRA_ARTERIAL

## 2022-10-30 MED ORDER — IOHEXOL 350 MG/ML SOLN
INTRAVENOUS | Status: DC | PRN
  Administered 2022-10-30: 85 mL

## 2022-10-30 MED ORDER — HYDROMORPHONE HCL 2 MG PO TABS
1.0000 mg | ORAL_TABLET | Freq: Once | ORAL | Status: AC
  Administered 2022-10-30: 1 mg via ORAL
  Filled 2022-10-30: qty 1

## 2022-10-30 MED ORDER — ASPIRIN 81 MG PO TBEC: 81.0000 mg | DELAYED_RELEASE_TABLET | Freq: Every day | ORAL | 11 refills | Status: DC

## 2022-10-30 SURGICAL SUPPLY — 12 items
CATH INFINITI 5FR JK (CATHETERS) IMPLANT
CATH LAUNCHER 6FR EBU3.5 (CATHETERS) IMPLANT
DEVICE RAD COMP TR BAND LRG (VASCULAR PRODUCTS) IMPLANT
GLIDESHEATH SLEND SS 6F .021 (SHEATH) IMPLANT
GUIDEWIRE INQWIRE 1.5J.035X260 (WIRE) IMPLANT
GUIDEWIRE PRESSURE X 175 (WIRE) IMPLANT
INQWIRE 1.5J .035X260CM (WIRE) ×1
KIT ESSENTIALS PG (KITS) IMPLANT
PACK CARDIAC CATHETERIZATION (CUSTOM PROCEDURE TRAY) ×2 IMPLANT
SET ATX-X65L (MISCELLANEOUS) IMPLANT
SHEATH PROBE COVER 6X72 (BAG) IMPLANT
TUBING CIL FLEX 10 FLL-RA (TUBING) IMPLANT

## 2022-10-30 NOTE — TOC Transition Note (Signed)
Transition of Care [facility]) - CM/SW Discharge Note   Patient Details  Name: Jillian [name] MRN: [id number] Date of Birth: 01-01-1946  Transition of Care [facility]) CM/SW Contact:  Leone Haven, RN Phone Number: 10/30/2022, 5:22 PM   Clinical Narrative:    For dc today. Spouse at the bedside to transport home.    Final next level of care: Home/Self Care Barriers to Discharge: No Barriers Identified   Patient Goals and CMS Choice   Choice offered to / list presented to : NA  Discharge Placement                         Discharge Plan and Services Additional resources added to the After Visit Summary for   In-house Referral: NA Discharge Planning Services: CM Consult Post Acute Care Choice: NA          DME Arranged: N/A DME Agency: NA       HH Arranged: NA          Social Determinants of Health (SDOH) Interventions SDOH Screenings   Food Insecurity: No Food Insecurity (10/30/2022)  Housing: Low Risk  (10/30/2022)  Transportation Needs: No Transportation Needs (10/30/2022)  Utilities: Not At Risk (10/30/2022)  Social Connections: Unknown (07/06/2021)   Received from [facility]  Stress: Stress Concern Present (09/26/2020)   Received from [facility], [facility]  Tobacco Use: Low Risk  (10/29/2022)     Readmission Risk Interventions     No data to display

## 2022-10-30 NOTE — TOC Initial Note (Signed)
Transition of Care [facility]) - Initial/Assessment Note    Patient Details  Name: Jillian [name] MRN: [id number] Date of Birth: 03-Jan-1946  Transition of Care [facility]) CM/SW Contact:    Leone Haven, RN Phone Number: 10/30/2022, 5:21 PM  Clinical Narrative:                 From home with spouse, has PCP and insurance on file, states has no HH services in place at this time or DME at home.  States spouse  will transport her home at [employer] and family is support system, states gets medications from [facility] [address] in [place]. Pta self ambulatory.   Expected Discharge Plan: Home/Self Care Barriers to Discharge: No Barriers Identified   Patient Goals and CMS Choice Patient states their goals for this hospitalization and ongoing recovery are:: return home   Choice offered to / list presented to : NA      Expected Discharge Plan and Services In-house Referral: NA Discharge Planning Services: CM Consult Post Acute Care Choice: NA Living arrangements for the past 2 months: Single Family Home Expected Discharge Date: 10/30/22               DME Arranged: N/A DME Agency: NA       HH Arranged: NA          Prior Living Arrangements/Services Living arrangements for the past 2 months: Single Family Home Lives with:: Spouse Patient language and need for interpreter reviewed:: Yes Do you feel safe going back to the place where you live?: Yes      Need for Family Participation in Patient Care: Yes (Comment) Care giver support system in place?: Yes (comment) Current home services: DME (bp cuff) Criminal Activity/Legal Involvement Pertinent to Current Situation/Hospitalization: No - Comment as needed  Activities of Daily Living Home Assistive Devices/Equipment: None ADL Screening (condition at time of admission) Patient's cognitive ability adequate to safely complete daily activities?: Yes Is the patient deaf or have difficulty hearing?: No Does the patient have  difficulty seeing, even when wearing glasses/contacts?: No Does the patient have difficulty concentrating, remembering, or making decisions?: No Patient able to express need for assistance with ADLs?: Yes Does the patient have difficulty dressing or bathing?: No Independently performs ADLs?: Yes (appropriate for developmental age) Does the patient have difficulty walking or climbing stairs?: Yes Weakness of Legs: Both Weakness of Arms/Hands: None  Permission Sought/Granted Permission sought to share information with : Case Manager Permission granted to share information with : Yes, Verbal Permission Granted              Emotional Assessment Appearance:: Appears stated age Attitude/Demeanor/Rapport: Engaged Affect (typically observed): Appropriate Orientation: : Oriented to Self, Oriented to Place, Oriented to  Time, Oriented to Situation Alcohol / Substance Use: Not Applicable Psych Involvement: No (comment)  Admission diagnosis:  Angina pectoris (HCC) [I20.9] Chest pain [R07.9] 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   Hypercholesterolemia 12/14/2019   Diabetes mellitus (HCC) 12/14/2019   Autoimmune disease (HCC) 12/14/2019   Diverticulosis 12/14/2019   Diverticulitis 12/14/2019   Foot pain, bilateral 09/13/2019   PCP:  Geoffry Paradise,  MD Pharmacy:   [facility] [address] [place] - [address] [place] [address] Phone: [phone] Fax: [phone]     Social Determinants of Health (SDOH) Social History: SDOH Screenings   Food Insecurity: No Food Insecurity (10/30/2022)   Housing: Low Risk  (10/30/2022)  Transportation Needs: No Transportation Needs (10/30/2022)  Utilities: Not At Risk (10/30/2022)  Social Connections: Unknown (07/06/2021)   Received from [facility], [facility]  Stress: Stress Concern Present (09/26/2020)   Received from [facility], [facility]  Tobacco Use: Low Risk  (10/29/2022)   SDOH Interventions:     Readmission Risk Interventions     No data to display

## 2022-10-30 NOTE — Plan of Care (Signed)
  Problem: Education: Goal: Ability to describe self-care measures that may prevent or decrease complications (Diabetes Survival Skills Education) will improve Outcome: Progressing Goal: Individualized Educational Video(s) Outcome: Progressing   Problem: Coping: Goal: Ability to adjust to condition or change in health will improve Outcome: Progressing   Problem: Fluid Volume: Goal: Ability to maintain a balanced intake and output will improve Outcome: Progressing   Problem: Health Behavior/Discharge Planning: Goal: Ability to identify and utilize available resources and services will improve Outcome: Progressing Goal: Ability to manage health-related needs will improve Outcome: Progressing   Problem: Metabolic: Goal: Ability to maintain appropriate glucose levels will improve Outcome: Progressing   Problem: Nutritional: Goal: Maintenance of adequate nutrition will improve Outcome: Progressing Goal: Progress toward achieving an optimal weight will improve Outcome: Progressing   Problem: Skin Integrity: Goal: Risk for impaired skin integrity will decrease Outcome: Progressing   Problem: Tissue Perfusion: Goal: Adequacy of tissue perfusion will improve Outcome: Progressing   Problem: Education: Goal: Understanding of cardiac disease, CV risk reduction, and recovery process will improve Outcome: Progressing Goal: Individualized Educational Video(s) Outcome: Progressing   Problem: Activity: Goal: Ability to tolerate increased activity will improve Outcome: Progressing   Problem: Cardiac: Goal: Ability to achieve and maintain adequate cardiovascular perfusion will improve Outcome: Progressing   Problem: Health Behavior/Discharge Planning: Goal: Ability to safely manage health-related needs after discharge will improve Outcome: Progressing   Problem: Education: Goal: Understanding of CV disease, CV risk reduction, and recovery process will improve Outcome:  Progressing Goal: Individualized Educational Video(s) Outcome: Progressing   Problem: Activity: Goal: Ability to return to baseline activity level will improve Outcome: Progressing   Problem: Cardiovascular: Goal: Ability to achieve and maintain adequate cardiovascular perfusion will improve Outcome: Progressing Goal: Vascular access site(s) Level 0-1 will be maintained Outcome: Progressing   Problem: Health Behavior/Discharge Planning: Goal: Ability to safely manage health-related needs after discharge will improve Outcome: Progressing   Problem: Education: Goal: Knowledge of General Education information will improve Description: Including pain rating scale, medication(s)/side effects and non-pharmacologic comfort measures Outcome: Progressing   Problem: Health Behavior/Discharge Planning: Goal: Ability to manage health-related needs will improve Outcome: Progressing   Problem: Clinical Measurements: Goal: Ability to maintain clinical measurements within normal limits will improve Outcome: Progressing Goal: Will remain free from infection Outcome: Progressing Goal: Diagnostic test results will improve Outcome: Progressing Goal: Respiratory complications will improve Outcome: Progressing Goal: Cardiovascular complication will be avoided Outcome: Progressing   Problem: Activity: Goal: Risk for activity intolerance will decrease Outcome: Progressing   Problem: Nutrition: Goal: Adequate nutrition will be maintained Outcome: Progressing   Problem: Coping: Goal: Level of anxiety will decrease Outcome: Progressing   Problem: Elimination: Goal: Will not experience complications related to bowel motility Outcome: Progressing Goal: Will not experience complications related to urinary retention Outcome: Progressing   Problem: Pain Managment: Goal: General experience of comfort will improve Outcome: Progressing   Problem: Safety: Goal: Ability to remain free from  injury will improve Outcome: Progressing   Problem: Skin Integrity: Goal: Risk for impaired skin integrity will decrease Outcome: Progressing   

## 2022-10-30 NOTE — Interval H&P Note (Signed)
History and Physical Interval Note:  10/30/2022 2:23 PM  Jillian [name]  has presented today for surgery, with the diagnosis of chest pain.  The various methods of treatment have been discussed with the patient and family. After consideration of risks, benefits and other options for treatment, the patient has consented to  Procedure(s): LEFT HEART CATH AND CORONARY ANGIOGRAPHY (N/A) as a surgical intervention.  The patient's history has been reviewed, patient examined, no change in status, stable for surgery.  I have reviewed the patient's chart and labs.  Questions were answered to the patient's satisfaction.     Lorine Bears

## 2022-10-30 NOTE — Progress Notes (Addendum)
Pt being d/c home. AVS given to patient , pt has no concerns or questions  at this time.  Vitals stable. Pt being escorted out via staff. Husband here to take patient home.   10/30/22 2245  Vitals  Temp 98.2 F (36.8 C)  Temp Source Oral  BP 111/60  MAP (mmHg) 74  BP Location Right Arm  BP Method Automatic  Patient Position (if appropriate) Lying  Pulse Rate 75  Pulse Rate Source Monitor  ECG Heart Rate 74  Resp 17  Level of Consciousness  Level of Consciousness Alert  MEWS COLOR  MEWS Score Color Green  Oxygen Therapy  SpO2 96 %  O2 Device Room Air  MEWS Score  MEWS Temp 0  MEWS Systolic 0  MEWS Pulse 0  MEWS RR 0  MEWS LOC 0  MEWS Score 0

## 2022-10-30 NOTE — ED Notes (Signed)
ED TO INPATIENT HANDOFF REPORT  ED Nurse Name and Phone #:  Theadora Rama RN [phone]  S Name/Age/Gender Jillian [name] 77 y.o. female Room/Bed: 041C/041C  Code Status   Code Status: Full Code  Home/SNF/Other Home Patient oriented to: self, place, time, and situation Is this baseline? Yes   Triage Complete: Triage complete  Chief Complaint Chest pain [R07.9]  Triage Note Pt c/o squeezing central chest and LA pain x several days, worsening, and bilateral LE swelling; bp at home 168/92; endorses nausea, denies sob; pain worse with movement; hx DM, HTN   Allergies Allergies  Allergen Reactions   Hydrocodone-Acetaminophen Itching   Oxycodone Itching   Metronidazole Nausea And Vomiting    Flu like symptoms Flu like symptoms   Nortriptyline Other (See Comments)   Sulfamethoxazole Other (See Comments)    Flu-like symptoms.   Amoxicillin     Facial Numbness   Iodinated Contrast Media     rash   Luminal [Phenobarbital] Other (See Comments)    Blisters   Mobic [Meloxicam]    Penicillins     Has patient had a PCN reaction causing immediate rash, facial/tongue/throat swelling, SOB or lightheadedness with hypotension:Y Has patient had a PCN reaction causing severe rash involving mucus membranes or skin necrosis: N Has patient had a PCN reaction that required hospitalization: N Has patient had a PCN reaction occurring within the last 10 years: N If all of the above answers are "NO", then may proceed with Cephalosporin use.    Tramadol Other (See Comments)    Unknown reaction   Allopurinol Rash   Ciprofloxacin Rash   Doxycycline Rash   Latex Rash   Tape Rash    Adhesive tape    Level of Care/Admitting Diagnosis ED Disposition     ED Disposition  Admit   Condition  --   Comment  Hospital Area: [facility] [[id number]]  Level of Care: Telemetry Cardiac [103]  May place patient in observation at [facility] or [facility] if equivalent level of care is  available:: No  Covid Evaluation: Asymptomatic - no recent exposure (last 10 days) testing not required  Diagnosis: Chest pain [744799]  Admitting Physician: Sande Rives [[id number]]  Attending Physician: Sande Rives [phone]          B Medical/Surgery History Past Medical History:  Diagnosis Date   Allergy    Anxiety    Bowel obstruction (HCC)    Cataract    Diabetes mellitus without complication (HCC)    Diverticulitis    GERD (gastroesophageal reflux disease)    Gout    High cholesterol    Hypertension    Hypothyroidism    IBS (irritable bowel syndrome)    Neck pain    Spondylosis of lumbar spine    Past Surgical History:  Procedure Laterality Date   ABDOMINAL HYSTERECTOMY     APPENDECTOMY     BREAST BIOPSY     unsure which breast   CARDIAC CATHETERIZATION     x 2   CHOLECYSTECTOMY     EXPLORATORY LAPAROTOMY     HERNIA REPAIR     RADIOFREQUENCY ABLATION NERVES  03/2020   on back   TONSILLECTOMY     WRIST FRACTURE SURGERY Right      A IV Location/Drains/Wounds Patient Lines/Drains/Airways Status     Active Line/Drains/Airways     Name Placement date Placement time Site Days   Peripheral IV 10/29/22 22 G 1.75" Right;Lateral Forearm 10/29/22  1815  Forearm  1   Peripheral IV 10/29/22 20 G Anterior;Left Forearm 10/29/22  2225  Forearm  1            Intake/Output Last 24 hours No intake or output data in the 24 hours ending 10/30/22 0049  Labs/Imaging Results for orders placed or performed during the hospital encounter of 10/29/22 (from the past 48 hour(s))  Basic metabolic panel     Status: Abnormal   Collection Time: 10/29/22 12:19 PM  Result Value Ref Range   Sodium 141 135 - 145 mmol/L   Potassium 4.2 3.5 - 5.1 mmol/L   Chloride 107 98 - 111 mmol/L   CO2 22 22 - 32 mmol/L   Glucose, Bld 102 (H) 70 - 99 mg/dL    Comment: Glucose reference range applies only to samples taken after fasting for at least 8 hours.   BUN 17 8 -  23 mg/dL   Creatinine, Ser 1.61 0.44 - 1.00 mg/dL   Calcium 9.9 8.9 - 09.6 mg/dL   GFR, Estimated >04 >54 mL/min    Comment: (NOTE) Calculated using the CKD-EPI Creatinine Equation (2021)    Anion gap 12 5 - 15    Comment: Performed at [facility], [address] [place], [place] [address]  CBC     Status: None   Collection Time: 10/29/22 12:19 PM  Result Value Ref Range   WBC 8.4 4.0 - 10.5 K/uL   RBC 4.33 3.87 - 5.11 MIL/uL   Hemoglobin 12.3 12.0 - 15.0 g/dL   HCT 91.4 78.2 - 95.6 %   MCV 89.8 80.0 - 100.0 fL   MCH 28.4 26.0 - 34.0 pg   MCHC 31.6 30.0 - 36.0 g/dL   RDW 21.3 08.6 - 57.8 %   Platelets 292 150 - 400 K/uL   nRBC 0.0 0.0 - 0.2 %    Comment: Performed at [facility], [address], [place], [place] [address]  Troponin I (High Sensitivity)     Status: None   Collection Time: 10/29/22 12:19 PM  Result Value Ref Range   Troponin I (High Sensitivity) 5 <18 ng/L    Comment: (NOTE) Elevated high sensitivity troponin I (hsTnI) values and significant  changes across serial measurements may suggest ACS but many other  chronic and acute conditions are known to elevate hsTnI results.  Refer to the "Links" section for chest pain algorithms and additional  guidance. Performed at [facility], [address], [place], [place] [address]   Brain natriuretic peptide     Status: None   Collection Time: 10/29/22 12:19 PM  Result Value Ref Range   B Natriuretic Peptide 29.4 0.0 - 100.0 pg/mL    Comment: Performed at [facility], [address], [place], [place] [address]  Troponin I (High Sensitivity)     Status: None   Collection Time: 10/29/22  4:16 PM  Result Value Ref Range   Troponin I (High Sensitivity) 7 <18 ng/L    Comment: (NOTE) Elevated high sensitivity troponin I (hsTnI) values and significant  changes across serial measurements may suggest ACS but many other  chronic and acute conditions are known to elevate hsTnI results.  Refer to  the "Links" section for chest pain algorithms and additional  guidance. Performed at [facility], [address], [place], [place] [address]   CBG monitoring, ED     Status: None   Collection Time: 10/29/22  4:48 PM  Result Value  Ref Range   Glucose-Capillary 81 70 - 99 mg/dL    Comment: Glucose reference range applies only to samples taken after fasting for at least 8 hours.  CBG monitoring, ED     Status: Abnormal   Collection Time: 10/29/22 10:18 PM  Result Value Ref Range   Glucose-Capillary 102 (H) 70 - 99 mg/dL    Comment: Glucose reference range applies only to samples taken after fasting for at least 8 hours.  CBC     Status: Abnormal   Collection Time: 10/29/22 10:29 PM  Result Value Ref Range   WBC 10.8 (H) 4.0 - 10.5 K/uL   RBC 4.48 3.87 - 5.11 MIL/uL   Hemoglobin 12.9 12.0 - 15.0 g/dL   HCT 69.4 85.4 - 62.7 %   MCV 89.5 80.0 - 100.0 fL   MCH 28.8 26.0 - 34.0 pg   MCHC 32.2 30.0 - 36.0 g/dL   RDW 03.5 00.9 - 38.1 %   Platelets 315 150 - 400 K/uL   nRBC 0.0 0.0 - 0.2 %    Comment: Performed at [facility], [address], [place], [place] [address]  Creatinine, serum     Status: Abnormal   Collection Time: 10/29/22 10:29 PM  Result Value Ref Range   Creatinine, Ser 1.02 (H) 0.44 - 1.00 mg/dL   GFR, Estimated 57 (L) >60 mL/min    Comment: (NOTE) Calculated using the CKD-EPI Creatinine Equation (2021) Performed at [facility], [address] [place], [place] [address]    DG Chest 2 View  Result Date: 10/29/2022 CLINICAL DATA:  Chest pain EXAM: CHEST - 2 VIEW COMPARISON:  None Available. FINDINGS: The heart size and mediastinal contours are within normal limits. Calcified aorta. Hyperinflation. No consolidation, pneumothorax or effusion. No edema. The visualized skeletal structures are unremarkable. Surgical clips in the upper abdomen. IMPRESSION: Hyperinflation.  No acute cardiopulmonary disease. Electronically Signed   By: Karen Kays M.D.    On: 10/29/2022 14:52    Pending Labs Unresulted Labs (From admission, onward)     Start     Ordered   10/30/22 0500  Lipoprotein A (LPA)  Tomorrow morning,   R        10/29/22 2205   10/30/22 0500  TSH  Tomorrow morning,   R        10/29/22 2205   10/30/22 0500  Lipid panel  Tomorrow morning,   R        10/29/22 2205   10/30/22 0500  CBC  Tomorrow morning,   R        10/29/22 2205   10/30/22 0500  Comprehensive metabolic panel  Tomorrow morning,   R        10/29/22 2205   10/30/22 0500  Hemoglobin A1c  Tomorrow morning,   R        10/29/22 1656            Vitals/Pain Today's Vitals   10/29/22 1605 10/29/22 1909 10/29/22 2226 10/29/22 2315  BP: (!) 147/71  (!) 151/78 (!) 155/79  Pulse: 83  96 87  Resp: 15  18 14   Temp:  (!) 97.4 F (36.3 C) (!) 97.5 F (36.4 C)   TempSrc:  Oral Oral   SpO2: 96%  100% 99%  Weight:      Height:      PainSc:        Isolation Precautions No active isolations  Medications Medications  nitroGLYCERIN (NITROSTAT) SL tablet 0.4 mg (  has no administration in time range)  insulin aspart (novoLOG) injection 0-9 Units ( Subcutaneous Not Given 10/29/22 1650)  cyclobenzaprine (FLEXERIL) tablet 10 mg (has no administration in time range)  levothyroxine (SYNTHROID) tablet 137 mcg (has no administration in time range)  hyoscyamine (LEVSIN SL) SL tablet 0.125 mg (has no administration in time range)  linaclotide (LINZESS) capsule 72 mcg (has no administration in time range)  polyethylene glycol (MIRALAX / GLYCOLAX) packet 17 g (has no administration in time range)  rosuvastatin (CRESTOR) tablet 20 mg (has no administration in time range)  irbesartan (AVAPRO) tablet 300 mg (has no administration in time range)  pantoprazole (PROTONIX) EC tablet 40 mg (40 mg Oral Not Given 10/29/22 1903)  aspirin EC tablet 81 mg (has no administration in time range)  ondansetron (ZOFRAN) injection 4 mg (has no administration in time range)  aspirin chewable tablet 81  mg (has no administration in time range)  heparin injection 5,000 Units (5,000 Units Subcutaneous Given 10/29/22 2341)  sodium chloride flush (NS) 0.9 % injection 3 mL (has no administration in time range)  sodium chloride flush (NS) 0.9 % injection 3 mL (has no administration in time range)  0.9 %  sodium chloride infusion (has no administration in time range)  0.9% sodium chloride infusion (has no administration in time range)    Followed by  0.9% sodium chloride infusion (has no administration in time range)  predniSONE (DELTASONE) tablet 50 mg (50 mg Oral Given 10/30/22 0008)  diphenhydrAMINE (BENADRYL) capsule 50 mg (has no administration in time range)    Or  diphenhydrAMINE (BENADRYL) injection 50 mg (has no administration in time range)  aspirin chewable tablet 324 mg (324 mg Oral Given 10/29/22 1616)  acetaminophen (TYLENOL) tablet 1,000 mg (1,000 mg Oral Given 10/29/22 2345)  morphine (PF) 4 MG/ML injection 4 mg (4 mg Intravenous Given 10/29/22 2241)    Mobility walks     Focused Assessments Cardiac Assessment Handoff:  Cardiac Rhythm: Normal sinus rhythm No results found for: "CKTOTAL", "CKMB", "CKMBINDEX", "TROPONINI" No results found for: "DDIMER" Does the Patient currently have chest pain? No   , Neuro Assessment Handoff:  Swallow screen pass? Yes  Cardiac Rhythm: Normal sinus rhythm       Neuro Assessment:   Neuro Checks:      Has TPA been given? No If patient is a Neuro Trauma and patient is going to OR before floor call report to 4N Charge nurse: [phone] or [phone]  , Pulmonary Assessment Handoff:  Lung sounds:   O2 Device: Room Air      R Recommendations: See Admitting Provider Note  Report given to:   Additional Notes:

## 2022-10-30 NOTE — Care Management Obs Status (Signed)
MEDICARE OBSERVATION STATUS NOTIFICATION   Patient Details  Name: Jillian [name] MRN: [id number] Date of Birth: 1946-01-16   Medicare Observation Status Notification Given:  Yes    Lawerance Sabal, RN 10/30/2022, 10:03 AM

## 2022-10-30 NOTE — Progress Notes (Addendum)
Progress Note  Patient Name: Jillian [name] Date of Encounter: 10/30/2022  Primary Cardiologist: Reatha Harps, MD  Subjective   No chest pain this AM. 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 Got morphine last night which didn't really help.  Inpatient Medications    Scheduled Meds:  aspirin EC  81 mg Oral Daily   diphenhydrAMINE  50 mg Oral Once   Or   diphenhydrAMINE  50 mg Intravenous Once   heparin  5,000 Units Subcutaneous Q8H   insulin aspart  0-9 Units Subcutaneous TID WC   irbesartan  300 mg Oral Daily   levothyroxine  137 mcg Oral Q0600   linaclotide  72 mcg Oral QAC breakfast   pantoprazole  40 mg Oral Daily   predniSONE  50 mg Oral Q6H   rosuvastatin  20 mg Oral Daily   sodium chloride flush  3 mL Intravenous Q12H   Continuous Infusions:  sodium chloride     sodium chloride     PRN Meds: sodium chloride, cyclobenzaprine, hyoscyamine, nitroGLYCERIN, ondansetron (ZOFRAN) IV, polyethylene glycol, sodium chloride flush   Vital Signs    Vitals:   10/30/22 0216 10/30/22 0426 10/30/22 0748 10/30/22 0749  BP: (!) 154/80 (!) 147/69  (!) 148/80  Pulse: 88 83 92   Resp: 18 18    Temp: 98.2 F (36.8 C) 98.2 F (36.8 C)    TempSrc: Oral Oral    SpO2: 100% 96% 100%   Weight: 90.4 kg     Height: 5\' 4"  (1.626 m)       Intake/Output Summary (Last 24 hours) at 10/30/2022 0800 Last data filed at 10/30/2022 0429 Gross per 24 hour  Intake --  Output 100 ml  Net -100 ml      10/30/2022    2:16 AM 10/29/2022   12:06 PM 09/27/2022    2:26 PM  Last 3 Weights  Weight (lbs) 199 lb 3.2 oz 196 lb 198 lb 12.8 oz  Weight (kg) 90.357 kg 88.905 kg 90.175 kg     Telemetry    NSR - Personally Reviewed  ECG    No new tracings - Personally Reviewed  Physical Exam   GEN: No acute distress.  HEENT: Normocephalic, atraumatic, sclera non-icteric. Neck: No JVD or  bruits. Cardiac: RRR no murmurs, rubs, or gallops.  Respiratory: Clear to auscultation bilaterally. Breathing is unlabored. GI: Soft, nontender, non-distended, BS +x 4. MS: no deformity. Extremities: No clubbing or cyanosis. No edema. Distal pedal pulses are 2+ and equal bilaterally. Neuro:  AAOx3. Follows commands. Psych:  Responds to questions appropriately with a normal affect.  Labs    High Sensitivity Troponin:   Recent Labs  Lab 10/29/22 1219 10/29/22 1616  TROPONINIHS 5 7      Cardiac EnzymesNo results for input(s): "TROPONINI" in the last 168 hours. No results for input(s): "TROPIPOC" in the last 168 hours.   Chemistry Recent Labs  Lab 10/29/22 1219 10/29/22 2229 10/30/22 0111  NA 141  --  140  K 4.2  --  3.8  CL 107  --  107  CO2 22  --  19*  GLUCOSE 102*  --  106*  BUN 17  --  15  CREATININE 0.91 1.02* 0.88  CALCIUM 9.9  --  9.5  PROT  --   --  7.0  ALBUMIN  --   --  3.9  AST  --   --  24  ALT  --   --  13  ALKPHOS  --   --  60  BILITOT  --   --  0.9  GFRNONAA >60 57* >60  ANIONGAP 12  --  14     Hematology Recent Labs  Lab 10/29/22 1219 10/29/22 2229 10/30/22 0111  WBC 8.4 10.8* 8.5  RBC 4.33 4.48 4.05  HGB 12.3 12.9 11.6*  HCT 38.9 40.1 36.2  MCV 89.8 89.5 89.4  MCH 28.4 28.8 28.6  MCHC 31.6 32.2 32.0  RDW 14.0 14.0 13.9  PLT 292 315 245    BNP Recent Labs  Lab 10/29/22 1219  BNP 29.4     DDimer No results for input(s): "DDIMER" in the last 168 hours.   Radiology    DG Chest 2 View  Result Date: 10/29/2022 CLINICAL DATA:  Chest pain EXAM: CHEST - 2 VIEW COMPARISON:  None Available. FINDINGS: The heart size and mediastinal contours are within normal limits. Calcified aorta. Hyperinflation. No consolidation, pneumothorax or effusion. No edema. The visualized skeletal structures are unremarkable. Surgical clips in the upper abdomen. IMPRESSION: Hyperinflation.  No acute cardiopulmonary disease. Electronically Signed   By: Karen Kays  M.D.   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  Normal myocardial blood flow reserve globally and in LAD territory. Moderate LAD calcifications on CT. No high risk findings such as TID or drop in EF with stress.  Overall, study is low risk, as area of ischemia is small   EKG not recorded during study   LV perfusion is abnormal. There is evidence of ischemia. There is evidence of infarction. Defect 1: There is a small defect with moderate reduction in uptake present in the apical inferior and apex location(s) that is partially reversible. There is abnormal wall motion in the defect area. Consistent with peri-infarct ischemia.   Rest left ventricular function is normal. Rest EF: 63%. Stress left ventricular function is normal. Stress EF: 72%. End diastolic cavity size is normal. End systolic cavity size is normal.   Myocardial blood flow was computed to be 2.31ml/g/min at rest and 4.6ml/g/min at stress. Global myocardial blood flow reserve was 2.38 and was normal.   Coronary calcium was present on the attenuation correction CT images. Moderate coronary calcifications were present. Coronary calcifications were present in the left anterior descending artery distribution(s).   Findings are consistent with infarction with peri-infarct ischemia. The study is low risk.   Electronically signed by Epifanio Lesches, MD  Patient Profile     77 y.o. female with  DM, HTN, HLD, obesity, coronary calcification seen on CT, contrast dye allergy, IBS, hypothyroidism, anxiety presented with chest pain and r/o for MI.  Assessment & Plan    1. Chest pain/possible unstable angina versus GERD - troponins negative despite fairly pervasive symptoms - stress test findings above; Dr. Flora Lipps reviewed and felt study was reassuring -  2D echo 12/2021 with normal LVEF - plan cath today - on pre-med protocol given contrast allergy - consent discussed 10/29/22 note - continue new ASA - continue home rosuvastatin; LDL 49 - added PPI - of note, also pending hip surgery 12/25/22   2. Hypothyroidism - TSH wnl, continue home thyroid regimen   3. HTN - follow BP on home regimen (subbing formulary ARB for telmisartan) - anticipate further med titration based on  cath results, some component driven by anxiety on admission but likely needs additional antihypertensive control. Reported SBP 130s at home    4. DM - held metformin - continue SSI  5. Mild anemia - no acute concerns for bleeding reported, can follow  5. Chronic headaches - no focal acute neurologic changes or deviation from usual symptoms - takes Flexeril PRN, OK to give per d/w MD (cath consent already obtained/signed in chart)  For questions or updates, please contact [facility] Please consult [email or web address] for contact info under Cardiology/STEMI.  Signed, Laurann Montana, PA-C 10/30/2022, 8:00 AM

## 2022-10-30 NOTE — H&P (View-Only) (Signed)
Progress Note  Patient Name: Jillian [name] Date of Encounter: 10/30/2022  Primary Cardiologist: Reatha Harps, MD  Subjective   No chest pain this AM. 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 Got morphine last night which didn't really help.  Inpatient Medications    Scheduled Meds:  aspirin EC  81 mg Oral Daily   diphenhydrAMINE  50 mg Oral Once   Or   diphenhydrAMINE  50 mg Intravenous Once   heparin  5,000 Units Subcutaneous Q8H   insulin aspart  0-9 Units Subcutaneous TID WC   irbesartan  300 mg Oral Daily   levothyroxine  137 mcg Oral Q0600   linaclotide  72 mcg Oral QAC breakfast   pantoprazole  40 mg Oral Daily   predniSONE  50 mg Oral Q6H   rosuvastatin  20 mg Oral Daily   sodium chloride flush  3 mL Intravenous Q12H   Continuous Infusions:  sodium chloride     sodium chloride     PRN Meds: sodium chloride, cyclobenzaprine, hyoscyamine, nitroGLYCERIN, ondansetron (ZOFRAN) IV, polyethylene glycol, sodium chloride flush   Vital Signs    Vitals:   10/30/22 0216 10/30/22 0426 10/30/22 0748 10/30/22 0749  BP: (!) 154/80 (!) 147/69  (!) 148/80  Pulse: 88 83 92   Resp: 18 18    Temp: 98.2 F (36.8 C) 98.2 F (36.8 C)    TempSrc: Oral Oral    SpO2: 100% 96% 100%   Weight: 90.4 kg     Height: 5\' 4"  (1.626 m)       Intake/Output Summary (Last 24 hours) at 10/30/2022 0800 Last data filed at 10/30/2022 0429 Gross per 24 hour  Intake --  Output 100 ml  Net -100 ml      10/30/2022    2:16 AM 10/29/2022   12:06 PM 09/27/2022    2:26 PM  Last 3 Weights  Weight (lbs) 199 lb 3.2 oz 196 lb 198 lb 12.8 oz  Weight (kg) 90.357 kg 88.905 kg 90.175 kg     Telemetry    NSR - Personally Reviewed  ECG    No new tracings - Personally Reviewed  Physical Exam   GEN: No acute distress.  HEENT: Normocephalic, atraumatic, sclera non-icteric. Neck: No JVD or  bruits. Cardiac: RRR no murmurs, rubs, or gallops.  Respiratory: Clear to auscultation bilaterally. Breathing is unlabored. GI: Soft, nontender, non-distended, BS +x 4. MS: no deformity. Extremities: No clubbing or cyanosis. No edema. Distal pedal pulses are 2+ and equal bilaterally. Neuro:  AAOx3. Follows commands. Psych:  Responds to questions appropriately with a normal affect.  Labs    High Sensitivity Troponin:   Recent Labs  Lab 10/29/22 1219 10/29/22 1616  TROPONINIHS 5 7      Cardiac EnzymesNo results for input(s): "TROPONINI" in the last 168 hours. No results for input(s): "TROPIPOC" in the last 168 hours.   Chemistry Recent Labs  Lab 10/29/22 1219 10/29/22 2229 10/30/22 0111  NA 141  --  140  K 4.2  --  3.8  CL 107  --  107  CO2 22  --  19*  GLUCOSE 102*  --  106*  BUN 17  --  15  CREATININE 0.91 1.02* 0.88  CALCIUM 9.9  --  9.5  PROT  --   --  7.0  ALBUMIN  --   --  3.9  AST  --   --  24  ALT  --   --  13  ALKPHOS  --   --  60  BILITOT  --   --  0.9  GFRNONAA >60 57* >60  ANIONGAP 12  --  14     Hematology Recent Labs  Lab 10/29/22 1219 10/29/22 2229 10/30/22 0111  WBC 8.4 10.8* 8.5  RBC 4.33 4.48 4.05  HGB 12.3 12.9 11.6*  HCT 38.9 40.1 36.2  MCV 89.8 89.5 89.4  MCH 28.4 28.8 28.6  MCHC 31.6 32.2 32.0  RDW 14.0 14.0 13.9  PLT 292 315 245    BNP Recent Labs  Lab 10/29/22 1219  BNP 29.4     DDimer No results for input(s): "DDIMER" in the last 168 hours.   Radiology    DG Chest 2 View  Result Date: 10/29/2022 CLINICAL DATA:  Chest pain EXAM: CHEST - 2 VIEW COMPARISON:  None Available. FINDINGS: The heart size and mediastinal contours are within normal limits. Calcified aorta. Hyperinflation. No consolidation, pneumothorax or effusion. No edema. The visualized skeletal structures are unremarkable. Surgical clips in the upper abdomen. IMPRESSION: Hyperinflation.  No acute cardiopulmonary disease. Electronically Signed   By: Karen Kays  M.D.   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  Normal myocardial blood flow reserve globally and in LAD territory. Moderate LAD calcifications on CT. No high risk findings such as TID or drop in EF with stress.  Overall, study is low risk, as area of ischemia is small   EKG not recorded during study   LV perfusion is abnormal. There is evidence of ischemia. There is evidence of infarction. Defect 1: There is a small defect with moderate reduction in uptake present in the apical inferior and apex location(s) that is partially reversible. There is abnormal wall motion in the defect area. Consistent with peri-infarct ischemia.   Rest left ventricular function is normal. Rest EF: 63%. Stress left ventricular function is normal. Stress EF: 72%. End diastolic cavity size is normal. End systolic cavity size is normal.   Myocardial blood flow was computed to be 2.31ml/g/min at rest and 4.6ml/g/min at stress. Global myocardial blood flow reserve was 2.38 and was normal.   Coronary calcium was present on the attenuation correction CT images. Moderate coronary calcifications were present. Coronary calcifications were present in the left anterior descending artery distribution(s).   Findings are consistent with infarction with peri-infarct ischemia. The study is low risk.   Electronically signed by Epifanio Lesches, MD  Patient Profile     77 y.o. female with  DM, HTN, HLD, obesity, coronary calcification seen on CT, contrast dye allergy, IBS, hypothyroidism, anxiety presented with chest pain and r/o for MI.  Assessment & Plan    1. Chest pain/possible unstable angina versus GERD - troponins negative despite fairly pervasive symptoms - stress test findings above; Dr. Flora Lipps reviewed and felt study was reassuring -  2D echo 12/2021 with normal LVEF - plan cath today - on pre-med protocol given contrast allergy - consent discussed 10/29/22 note - continue new ASA - continue home rosuvastatin; LDL 49 - added PPI - of note, also pending hip surgery 12/25/22   2. Hypothyroidism - TSH wnl, continue home thyroid regimen   3. HTN - follow BP on home regimen (subbing formulary ARB for telmisartan) - anticipate further med titration based on  cath results, some component driven by anxiety on admission but likely needs additional antihypertensive control. Reported SBP 130s at home    4. DM - held metformin - continue SSI  5. Mild anemia - no acute concerns for bleeding reported, can follow  5. Chronic headaches - no focal acute neurologic changes or deviation from usual symptoms - takes Flexeril PRN, OK to give per d/w MD (cath consent already obtained/signed in chart)  For questions or updates, please contact [facility] Please consult [email or web address] for contact info under Cardiology/STEMI.  Signed, Laurann Montana, PA-C 10/30/2022, 8:00 AM

## 2022-10-30 NOTE — Plan of Care (Signed)
Problem: Education: Goal: Ability to describe self-care measures that may prevent or decrease complications (Diabetes Survival Skills Education) will improve 10/30/2022 2240 by Tracie Harrier, RN Outcome: Adequate for Discharge 10/30/2022 2239 by Tracie Harrier, RN Outcome: Progressing 10/30/2022 2234 by Tracie Harrier, RN Outcome: Progressing Goal: Individualized Educational Video(s) 10/30/2022 2240 by Tracie Harrier, RN Outcome: Adequate for Discharge 10/30/2022 2239 by Tracie Harrier, RN Outcome: Progressing 10/30/2022 2234 by Tracie Harrier, RN Outcome: Progressing   Problem: Coping: Goal: Ability to adjust to condition or change in health will improve 10/30/2022 2240 by Tracie Harrier, RN Outcome: Adequate for Discharge 10/30/2022 2239 by Tracie Harrier, RN Outcome: Progressing 10/30/2022 2234 by Tracie Harrier, RN Outcome: Progressing   Problem: Fluid Volume: Goal: Ability to maintain a balanced intake and output will improve 10/30/2022 2240 by Tracie Harrier, RN Outcome: Adequate for Discharge 10/30/2022 2239 by Tracie Harrier, RN Outcome: Progressing 10/30/2022 2234 by Tracie Harrier, RN Outcome: Progressing   Problem: Health Behavior/Discharge Planning: Goal: Ability to identify and utilize available resources and services will improve 10/30/2022 2240 by Tracie Harrier, RN Outcome: Adequate for Discharge 10/30/2022 2239 by Tracie Harrier, RN Outcome: Progressing 10/30/2022 2234 by Tracie Harrier, RN Outcome: Progressing Goal: Ability to manage health-related needs will improve 10/30/2022 2240 by Tracie Harrier, RN Outcome: Adequate for Discharge 10/30/2022 2239 by Tracie Harrier, RN Outcome: Progressing 10/30/2022 2234 by Tracie Harrier, RN Outcome: Progressing   Problem: Metabolic: Goal: Ability to maintain appropriate glucose levels will improve 10/30/2022 2240 by Tracie Harrier, RN Outcome: Adequate for Discharge 10/30/2022 2239 by Tracie Harrier, RN Outcome: Progressing 10/30/2022 2234  by Tracie Harrier, RN Outcome: Progressing   Problem: Nutritional: Goal: Maintenance of adequate nutrition will improve 10/30/2022 2240 by Tracie Harrier, RN Outcome: Adequate for Discharge 10/30/2022 2239 by Tracie Harrier, RN Outcome: Progressing 10/30/2022 2234 by Tracie Harrier, RN Outcome: Progressing Goal: Progress toward achieving an optimal weight will improve 10/30/2022 2240 by Tracie Harrier, RN Outcome: Adequate for Discharge 10/30/2022 2239 by Tracie Harrier, RN Outcome: Progressing 10/30/2022 2234 by Tracie Harrier, RN Outcome: Progressing   Problem: Skin Integrity: Goal: Risk for impaired skin integrity will decrease 10/30/2022 2240 by Tracie Harrier, RN Outcome: Adequate for Discharge 10/30/2022 2239 by Tracie Harrier, RN Outcome: Progressing 10/30/2022 2234 by Tracie Harrier, RN Outcome: Progressing   Problem: Tissue Perfusion: Goal: Adequacy of tissue perfusion will improve 10/30/2022 2240 by Tracie Harrier, RN Outcome: Adequate for Discharge 10/30/2022 2239 by Tracie Harrier, RN Outcome: Progressing 10/30/2022 2234 by Tracie Harrier, RN Outcome: Progressing   Problem: Education: Goal: Understanding of cardiac disease, CV risk reduction, and recovery process will improve 10/30/2022 2240 by Tracie Harrier, RN Outcome: Adequate for Discharge 10/30/2022 2239 by Tracie Harrier, RN Outcome: Progressing 10/30/2022 2234 by Tracie Harrier, RN Outcome: Progressing Goal: Individualized Educational Video(s) 10/30/2022 2240 by Tracie Harrier, RN Outcome: Adequate for Discharge 10/30/2022 2239 by Tracie Harrier, RN Outcome: Progressing 10/30/2022 2234 by Tracie Harrier, RN Outcome: Progressing   Problem: Activity: Goal: Ability to tolerate increased activity will improve 10/30/2022 2240 by Tracie Harrier, RN Outcome: Adequate for Discharge 10/30/2022 2239 by Tracie Harrier, RN Outcome: Progressing 10/30/2022 2234 by Tracie Harrier, RN Outcome: Progressing   Problem: Cardiac: Goal: Ability to  achieve and maintain adequate cardiovascular perfusion will improve 10/30/2022 2240 by Tracie Harrier, RN Outcome: Adequate for Discharge 10/30/2022 2239 by Tracie Harrier, RN Outcome: Progressing 10/30/2022 2234 by Tracie Harrier, RN Outcome: Progressing   Problem: Health Behavior/Discharge Planning: Goal: Ability to safely manage health-related needs after discharge will improve 10/30/2022 2240 by  Tracie Harrier, RN Outcome: Adequate for Discharge 10/30/2022 2239 by Tracie Harrier, RN Outcome: Progressing 10/30/2022 2234 by Tracie Harrier, RN Outcome: Progressing   Problem: Education: Goal: Understanding of CV disease, CV risk reduction, and recovery process will improve 10/30/2022 2240 by Tracie Harrier, RN Outcome: Adequate for Discharge 10/30/2022 2239 by Tracie Harrier, RN Outcome: Progressing 10/30/2022 2234 by Tracie Harrier, RN Outcome: Progressing Goal: Individualized Educational Video(s) 10/30/2022 2240 by Tracie Harrier, RN Outcome: Adequate for Discharge 10/30/2022 2239 by Tracie Harrier, RN Outcome: Progressing 10/30/2022 2234 by Tracie Harrier, RN Outcome: Progressing   Problem: Activity: Goal: Ability to return to baseline activity level will improve 10/30/2022 2240 by Tracie Harrier, RN Outcome: Adequate for Discharge 10/30/2022 2239 by Tracie Harrier, RN Outcome: Progressing 10/30/2022 2234 by Tracie Harrier, RN Outcome: Progressing   Problem: Cardiovascular: Goal: Ability to achieve and maintain adequate cardiovascular perfusion will improve 10/30/2022 2240 by Tracie Harrier, RN Outcome: Adequate for Discharge 10/30/2022 2239 by Tracie Harrier, RN Outcome: Progressing 10/30/2022 2234 by Tracie Harrier, RN Outcome: Progressing Goal: Vascular access site(s) Level 0-1 will be maintained 10/30/2022 2240 by Tracie Harrier, RN Outcome: Adequate for Discharge 10/30/2022 2239 by Tracie Harrier, RN Outcome: Progressing 10/30/2022 2234 by Tracie Harrier, RN Outcome: Progressing   Problem:  Health Behavior/Discharge Planning: Goal: Ability to safely manage health-related needs after discharge will improve 10/30/2022 2240 by Tracie Harrier, RN Outcome: Adequate for Discharge 10/30/2022 2239 by Tracie Harrier, RN Outcome: Progressing 10/30/2022 2234 by Tracie Harrier, RN Outcome: Progressing   Problem: Education: Goal: Knowledge of General Education information will improve Description: Including pain rating scale, medication(s)/side effects and non-pharmacologic comfort measures 10/30/2022 2240 by Tracie Harrier, RN Outcome: Adequate for Discharge 10/30/2022 2239 by Tracie Harrier, RN Outcome: Progressing 10/30/2022 2234 by Tracie Harrier, RN Outcome: Progressing   Problem: Health Behavior/Discharge Planning: Goal: Ability to manage health-related needs will improve 10/30/2022 2240 by Tracie Harrier, RN Outcome: Adequate for Discharge 10/30/2022 2239 by Tracie Harrier, RN Outcome: Progressing 10/30/2022 2234 by Tracie Harrier, RN Outcome: Progressing   Problem: Clinical Measurements: Goal: Ability to maintain clinical measurements within normal limits will improve 10/30/2022 2240 by Tracie Harrier, RN Outcome: Adequate for Discharge 10/30/2022 2239 by Tracie Harrier, RN Outcome: Progressing 10/30/2022 2234 by Tracie Harrier, RN Outcome: Progressing Goal: Will remain free from infection 10/30/2022 2240 by Tracie Harrier, RN Outcome: Adequate for Discharge 10/30/2022 2239 by Tracie Harrier, RN Outcome: Progressing 10/30/2022 2234 by Tracie Harrier, RN Outcome: Progressing Goal: Diagnostic test results will improve 10/30/2022 2240 by Tracie Harrier, RN Outcome: Adequate for Discharge 10/30/2022 2239 by Tracie Harrier, RN Outcome: Progressing 10/30/2022 2234 by Tracie Harrier, RN Outcome: Progressing Goal: Respiratory complications will improve 10/30/2022 2240 by Tracie Harrier, RN Outcome: Adequate for Discharge 10/30/2022 2239 by Tracie Harrier, RN Outcome: Progressing 10/30/2022 2234 by  Tracie Harrier, RN Outcome: Progressing Goal: Cardiovascular complication will be avoided 10/30/2022 2240 by Tracie Harrier, RN Outcome: Adequate for Discharge 10/30/2022 2239 by Tracie Harrier, RN Outcome: Progressing 10/30/2022 2234 by Tracie Harrier, RN Outcome: Progressing   Problem: Activity: Goal: Risk for activity intolerance will decrease 10/30/2022 2240 by Tracie Harrier, RN Outcome: Adequate for Discharge 10/30/2022 2239 by Tracie Harrier, RN Outcome: Progressing 10/30/2022 2234 by Tracie Harrier, RN Outcome: Progressing   Problem: Nutrition: Goal: Adequate nutrition will be maintained 10/30/2022 2240 by Tracie Harrier, RN Outcome: Adequate for Discharge 10/30/2022 2239 by Tracie Harrier, RN Outcome: Progressing 10/30/2022 2234 by Tracie Harrier, RN Outcome: Progressing   Problem: Coping: Goal: Level of anxiety will decrease 10/30/2022 2240  by Tracie Harrier, RN Outcome: Adequate for Discharge 10/30/2022 2239 by Tracie Harrier, RN Outcome: Progressing 10/30/2022 2234 by Tracie Harrier, RN Outcome: Progressing   Problem: Elimination: Goal: Will not experience complications related to bowel motility 10/30/2022 2240 by Tracie Harrier, RN Outcome: Adequate for Discharge 10/30/2022 2239 by Tracie Harrier, RN Outcome: Progressing 10/30/2022 2234 by Tracie Harrier, RN Outcome: Progressing Goal: Will not experience complications related to urinary retention 10/30/2022 2240 by Tracie Harrier, RN Outcome: Adequate for Discharge 10/30/2022 2239 by Tracie Harrier, RN Outcome: Progressing 10/30/2022 2234 by Tracie Harrier, RN Outcome: Progressing   Problem: Pain Managment: Goal: General experience of comfort will improve 10/30/2022 2240 by Tracie Harrier, RN Outcome: Adequate for Discharge 10/30/2022 2239 by Tracie Harrier, RN Outcome: Progressing 10/30/2022 2234 by Tracie Harrier, RN Outcome: Progressing   Problem: Safety: Goal: Ability to remain free from injury will improve 10/30/2022 2240 by  Tracie Harrier, RN Outcome: Adequate for Discharge 10/30/2022 2239 by Tracie Harrier, RN Outcome: Progressing 10/30/2022 2234 by Tracie Harrier, RN Outcome: Progressing   Problem: Skin Integrity: Goal: Risk for impaired skin integrity will decrease 10/30/2022 2240 by Tracie Harrier, RN Outcome: Adequate for Discharge 10/30/2022 2239 by Tracie Harrier, RN Outcome: Progressing 10/30/2022 2234 by Tracie Harrier, RN Outcome: Progressing

## 2022-10-30 NOTE — Plan of Care (Signed)

## 2022-10-30 NOTE — Discharge Summary (Signed)
Discharge Summary    Patient ID: Jillian [name] MRN: [id number]; DOB: 1945/09/17  Admit date: 10/29/2022 Discharge date: 10/30/2022  PCP:  Geoffry Paradise, MD   [facility] Providers Cardiologist:  Reatha Harps, MD        Discharge Diagnoses    Principal Problem:   Unstable angina [facility]) Active Problems:   Hypertension   Hypercholesterolemia   Diabetes mellitus (HCC)   Chest pain   CAD in native artery   Diagnostic Studies/Procedures    Cath 10/30/22   Prox LAD to Mid LAD lesion is 60% stenosed.   1st Diag lesion is 60% stenosed.   Prox RCA lesion is 30% stenosed.   The left ventricular systolic function is normal.   LV end diastolic pressure is mildly elevated.   The left ventricular ejection fraction is 55-65% by visual estimate.   1.  Moderate bifurcation stenosis involving proximal/mid LAD at the origin of first diagonal which also has moderate stenosis.  The LAD stenosis was interrogated by flow reserve evaluation and was not significant with an RFR of 0.94. 2.  Normal LV systolic function. 3.  Mildly elevated left ventricular end-diastolic pressure at 20 mmHg in the setting of sinus tachycardia and significantly elevated blood pressure.   Recommendations: Recommend aggressive medical therapy.  No indication for PCI.   _____________   History of Present Illness     Jillian [name] is a 77 y.o. female with DM, HTN, HLD, obesity, coronary calcification seen on CT, contrast dye allergy, IBS, hypothyroidism, anxiety who presented to [facility] with chest pain. Prior echo 12/2021 before [facility] showed EF 60-65%, G1DD, ULN ascending aorta 3.78cm. She recently saw Dr. Flora Lipps 09/27/22 for evaluation of episodic chest tightness that occurred primarily with sitting. She also had exertional dyspnea. EKG showed NSR with nonspecific STTW changes. She has a family history of heart disease as her father had an MI. She is scheduled to undergo hip  surgery 12/25/22. Cardiac PET was done 10/24/22 showing small partially reversible perfusion defect at apex and apical inferior wall, with hypokinesis at apex, suggesting distal LAD territory infarct with peri-infarct ischemia, but overall low risk as area of ischemia was small. Coronary calcifications were present in the LAD. Dr. Flora Lipps reviewed the study. He felt the defect was either related to distal LAD ischemia versus artifact, favoring the latter. She called the office on 10/29/22 requesting her results and reporting continued chest pain. She was also reporting chest pain happening constantly all weekend that radiated to both breasts and left arm, feeling like something was pushing up into her chest. She denied SOB, lightheadedness, syncope. BP was elevated to the 160s. She was subsequently recommended to come to the ER. EKG shows NSR with nonspecific STTW changes similar to 8/2 tracing. Troponins were negative. She was anxious about the idea of further workup. She was admitted to the cardiology service with plan for definitive cardiac catheterization.  Hospital Course     1. Chest pain/possible unstable angina versus GERD - troponins negative despite fairly pervasive symptoms - underwent cardiac catheterization today with findings above, did not require PCI, recommended for medical therapy, carvedilol added for blood pressure - continue home rosuvastatin; LDL 49 - continue new ASA 81mg  at discharge - continue Prilosec 40mg  daily at discharge (also had Pepcid PRN on med list, takes only occasionally for breakthrough)  - patient is pending hip surgery on 12/25/22: Dr. Flora Lipps feels she may proceed as requested and feels she may hold her  aspirin if surgeon feels necessary prior to surgery; we typically advise that blood thinners be resumed when felt safe by performing physician. We will defer to surgeon to relay final pre-surgical instructions to the patient. Will forward copy of discharge summary to  requesting surgeon from original phone note contact information - per d/w MD since chest pain not felt to be cardiac holding off SL NTG  2. Hypothyroidism - TSH wnl, continue home thyroid regimen   3. HTN - SBP suboptimally controlled - continue home ARB - carvedilol 3.125mg  BID added   4. DM - advised to hold metformin for 48 hours post cath, to resume 11/02/22  5. Mild anemia - no acute concerns for bleeding reported, can follow   5. Chronic headaches - no focal acute neurologic changes or deviation from usual symptoms - takes Flexeril PRN, continue - had PRN ibuprofen on med list as well, advised to limit dosing given ASA use and h/o GERD  Dr Flora Lipps has seen and examined the patient today and feels she is stable for discharge.      Did the patient have an acute coronary syndrome (MI, NSTEMI, STEMI, etc) this admission?:  No                               Did the patient have a percutaneous coronary intervention (stent / angioplasty)?:  No.          _____________  Discharge Vitals Blood pressure (!) 167/87, pulse 93, temperature 98.1 F (36.7 C), temperature source Oral, resp. rate 18, height 5\' 4"  (1.626 m), weight 90.4 kg, SpO2 100%.  Filed Weights   10/29/22 1206 10/30/22 0216  Weight: 88.9 kg 90.4 kg    Labs & Radiologic Studies    CBC Recent Labs    10/29/22 2229 10/30/22 0111  WBC 10.8* 8.5  HGB 12.9 11.6*  HCT 40.1 36.2  MCV 89.5 89.4  PLT 315 245   Basic Metabolic Panel Recent Labs    40/98/11 1219 10/29/22 2229 10/30/22 0111  NA 141  --  140  K 4.2  --  3.8  CL 107  --  107  CO2 22  --  19*  GLUCOSE 102*  --  106*  BUN 17  --  15  CREATININE 0.91 1.02* 0.88  CALCIUM 9.9  --  9.5   Liver Function Tests Recent Labs    10/30/22 0111  AST 24  ALT 13  ALKPHOS 60  BILITOT 0.9  PROT 7.0  ALBUMIN 3.9   No results for input(s): "LIPASE", "AMYLASE" in the last 72 hours. High Sensitivity Troponin:   Recent Labs  Lab 10/29/22 1219  10/29/22 1616  TROPONINIHS 5 7    BNP Invalid input(s): "POCBNP" D-Dimer No results for input(s): "DDIMER" in the last 72 hours. Hemoglobin A1C Recent Labs    10/30/22 0111  HGBA1C 6.6*   Fasting Lipid Panel Recent Labs    10/30/22 0111  CHOL 122  HDL 55  LDLCALC 49  TRIG 90  CHOLHDL 2.2   Thyroid Function Tests Recent Labs    10/30/22 0111  TSH 0.877   _____________  CARDIAC CATHETERIZATION  Result Date: 10/30/2022   Prox LAD to Mid LAD lesion is 60% stenosed.   1st Diag lesion is 60% stenosed.   Prox RCA lesion is 30% stenosed.   The left ventricular systolic function is normal.   LV end diastolic pressure is mildly elevated.  The left ventricular ejection fraction is 55-65% by visual estimate. 1.  Moderate bifurcation stenosis involving proximal/mid LAD at the origin of first diagonal which also has moderate stenosis.  The LAD stenosis was interrogated by flow reserve evaluation and was not significant with an RFR of 0.94. 2.  Normal LV systolic function. 3.  Mildly elevated left ventricular end-diastolic pressure at 20 mmHg in the setting of sinus tachycardia and significantly elevated blood pressure. Recommendations: Recommend aggressive medical therapy.  No indication for PCI.   DG Chest 2 View  Result Date: 10/29/2022 CLINICAL DATA:  Chest pain EXAM: CHEST - 2 VIEW COMPARISON:  None Available. FINDINGS: The heart size and mediastinal contours are within normal limits. Calcified aorta. Hyperinflation. No consolidation, pneumothorax or effusion. No edema. The visualized skeletal structures are unremarkable. Surgical clips in the upper abdomen. IMPRESSION: Hyperinflation.  No acute cardiopulmonary disease. Electronically Signed   By: Karen Kays M.D.   On: 10/29/2022 14:52   NM PET CT CARDIAC PERFUSION MULTI W/ABSOLUTE BLOODFLOW  Result Date: 10/25/2022   Small partially reversible perfusion defect at apex and apical inferior wall, with hypokinesis at apex, suggesting  distal LAD territory infarct with peri-infarct ischemia.  Normal myocardial blood flow reserve globally and in LAD territory. Moderate LAD calcifications on CT. No high risk findings such as TID or drop in EF with stress.  Overall, study is low risk, as area of ischemia is small   EKG not recorded during study   LV perfusion is abnormal. There is evidence of ischemia. There is evidence of infarction. Defect 1: There is a small defect with moderate reduction in uptake present in the apical inferior and apex location(s) that is partially reversible. There is abnormal wall motion in the defect area. Consistent with peri-infarct ischemia.   Rest left ventricular function is normal. Rest EF: 63%. Stress left ventricular function is normal. Stress EF: 72%. End diastolic cavity size is normal. End systolic cavity size is normal.   Myocardial blood flow was computed to be 2.71ml/g/min at rest and 4.43ml/g/min at stress. Global myocardial blood flow reserve was 2.38 and was normal.   Coronary calcium was present on the attenuation correction CT images. Moderate coronary calcifications were present. Coronary calcifications were present in the left anterior descending artery distribution(s).   Findings are consistent with infarction with peri-infarct ischemia. The study is low risk.   Electronically signed by Epifanio Lesches, MD CLINICAL DATA:  This over-read does not include interpretation of cardiac or coronary anatomy or pathology. The Cardiac PET CT interpretation by the cardiologist is attached. COMPARISON:  None Available. FINDINGS: Cardiovascular: Aortic atherosclerosis. Normal heart size. Left coronary artery calcifications. No pericardial effusion. Limited Mediastinum/Nodes: No enlarged mediastinal, hilar, or axillary lymph nodes. Trachea and esophagus demonstrate no significant findings. Limited Lungs/Pleura: Minimal bibasilar scarring or atelectasis. No pleural effusion or pneumothorax. Upper Abdomen: No acute  abnormality. Musculoskeletal: No chest wall abnormality. No acute osseous findings. IMPRESSION: 1. No acute CT findings of the chest. 2. Coronary artery disease. Aortic Atherosclerosis (ICD10-I70.0). Electronically Signed   By: Jearld Lesch M.D.   On: 10/24/2022 11:59  Disposition   Pt is being discharged home today in good condition.  Follow-up Plans & Appointments     Follow-up Information     O'Neal, Ronnald Ramp, MD Follow up.   Specialties: Cardiology, Internal Medicine, Radiology Why: [facility] - follow-up with Dr. Flora Lipps on Wednesday Nov 06, 2022 at 9:00 AM. Wilmon Arms at 8:45 AM to check in. Contact information: [address]  [place] [address] [phone]                Discharge Instructions     Diet - low sodium heart healthy   Complete by: As directed    Discharge instructions   Complete by: As directed    !!! IMPORTANT: After a heart catheterization, you will need to avoid taking metformin for at least 48 hours. We would recommend you restart this on 11/02/22.  You were started on a baby aspirin 81mg  once a day.  You were started on a blood pressure/heart medicine called carvedilol.   Patients taking aspirin should generally limit medicines like ibuprofen, Advil, Motrin, naproxen, and Aleve due to risk of stomach bleeding. You may take Tylenol as directed or talk to your primary doctor about alternatives.  Hyoscyamine and linaclotide were removed from your list since you reported you no longer take these.   Increase activity slowly   Complete by: As directed    No driving for 3 days. No lifting over 5 lbs for 1 week. No sexual activity for 1 week. Keep procedure site clean & dry. If you notice increased pain, swelling, bleeding or pus, call/return!  You may shower, but no soaking baths/hot tubs/pools for 1 week.        Discharge Medications   Allergies as of 10/30/2022       Reactions   Hydrocodone-acetaminophen Itching   Oxycodone Itching    Metronidazole Nausea And Vomiting   Flu like symptoms Flu like symptoms   Nortriptyline Other (See Comments)   Sulfamethoxazole Other (See Comments)   Flu-like symptoms.   Amoxicillin    Facial Numbness   Iodinated Contrast Media    rash   Luminal [phenobarbital] Other (See Comments)   Blisters   Mobic [meloxicam]    Penicillins    Has patient had a PCN reaction causing immediate rash, facial/tongue/throat swelling, SOB or lightheadedness with hypotension:Y Has patient had a PCN reaction causing severe rash involving mucus membranes or skin necrosis: N Has patient had a PCN reaction that required hospitalization: N Has patient had a PCN reaction occurring within the last 10 years: N If all of the above answers are "NO", then may proceed with Cephalosporin use.   Tramadol Other (See Comments)   Unknown reaction   Allopurinol Rash   Ciprofloxacin Rash   Doxycycline Rash   Latex Rash   Tape Rash   Adhesive tape        Medication List     STOP taking these medications    hyoscyamine 0.125 MG SL tablet Commonly known as: LEVSIN SL   linaclotide 72 MCG capsule Commonly known as: LINZESS       TAKE these medications    aspirin EC 81 MG tablet Take 1 tablet (81 mg total) by mouth daily. Swallow whole. Start taking on: October 31, 2022   carvedilol 3.125 MG tablet Commonly known as: COREG Take 1 tablet (3.125 mg total) by mouth 2 (two) times daily with a meal.   cyclobenzaprine 10 MG tablet Commonly known as: FLEXERIL Take 10 mg by mouth 3 (three) times daily as needed for muscle spasms.   Euthyrox 137 MCG tablet Generic drug: levothyroxine Take 137 mcg by mouth every morning.   ibuprofen 200 MG tablet Commonly known as: ADVIL Take 600 mg by mouth 3 (three) times daily as needed for moderate pain. Notes to patient: Patients on aspirin should generally limit medicines like ibuprofen, Advil, Motrin, naproxen, and Aleve due to risk  of stomach bleeding. You may  take Tylenol as directed or talk to your primary doctor about alternatives.    metFORMIN 500 MG 24 hr tablet Commonly known as: GLUCOPHAGE-XR Take 1,000 mg by mouth 2 (two) times daily. Notes to patient: !!! IMPORTANT: After a heart catheterization, you will need to avoid taking metformin for at least 48 hours. We would recommend you restart this on 11/02/22.   omeprazole 40 MG capsule Commonly known as: PRILOSEC Take 40 mg by mouth daily.   PEPCID PO Take by mouth as needed.   polyethylene glycol powder 17 GM/SCOOP powder Commonly known as: GLYCOLAX/MIRALAX Take 1 dose once to twice daily by mouth in 8 ounces of fluid What changed:  how much to take how to take this when to take this reasons to take this additional instructions   rosuvastatin 20 MG tablet Commonly known as: CRESTOR Take 20 mg by mouth daily.   telmisartan 80 MG tablet Commonly known as: MICARDIS Take 1 tablet by mouth daily before breakfast.           Outstanding Labs/Studies   N/A  Duration of Discharge Encounter   Greater than 30 minutes including physician time.  Signed, Laurann Montana, PA-C 10/30/2022, 3:44 PM

## 2022-10-31 ENCOUNTER — Encounter (HOSPITAL_COMMUNITY): Payer: Self-pay | Admitting: Cardiovascular Disease

## 2022-10-31 LAB — LIPOPROTEIN A (LPA): Lipoprotein (a): 20 nmol/L (ref <75.0)

## 2022-11-01 ENCOUNTER — Telehealth: Payer: Self-pay | Admitting: Cardiovascular Disease

## 2022-11-01 NOTE — Telephone Encounter (Signed)
Cath site is clean and dry patient will take bandage off

## 2022-11-01 NOTE — Telephone Encounter (Signed)
Pt wants to know if she can take her bandage off from where she had a heart cath on Wednesday. Please advise

## 2022-11-03 NOTE — Progress Notes (Unsigned)
Cardiology Office Note:   Date:  11/03/2022  NAME:  Jillian [name]    MRN: [id number] DOB:  [birth year]   PCP:  Geoffry Paradise, MD  Cardiologist:  Reatha Harps, MD  Electrophysiologist:  None   Referring MD: Geoffry Paradise, MD   No chief complaint on file.   History of Present Illness:   Jillian [name] is a 77 y.o. female with a hx of DM, CAD, HTN who presents for follow-up.   Problem List DM -A1c 6.6 2. HLD -T chol 122, HDL 55, LDL 49, TG 90, LP(a) 20 3. HTN 4. CAD -60% mid LAD (RFR 0.94)  Past Medical History: Past Medical History:  Diagnosis Date   Allergy    Anxiety    Bowel obstruction (HCC)    Cataract    Diabetes mellitus without complication (HCC)    Diverticulitis    GERD (gastroesophageal reflux disease)    Gout    High cholesterol    Hypertension    Hypothyroidism    IBS (irritable bowel syndrome)    Neck pain    Spondylosis of lumbar spine     Past Surgical History: Past Surgical History:  Procedure Laterality Date   ABDOMINAL HYSTERECTOMY     APPENDECTOMY     BREAST BIOPSY     unsure which breast   CARDIAC CATHETERIZATION     x 2   CHOLECYSTECTOMY     CORONARY PRESSURE/FFR STUDY N/A 10/30/2022   Procedure: CORONARY PRESSURE/FFR STUDY;  Surgeon: Iran Ouch, MD;  Location: [facility] INVASIVE CV LAB;  Service: Cardiovascular;  Laterality: N/A;   EXPLORATORY LAPAROTOMY     HERNIA REPAIR     LEFT HEART CATH AND CORONARY ANGIOGRAPHY N/A 10/30/2022   Procedure: LEFT HEART CATH AND CORONARY ANGIOGRAPHY;  Surgeon: Iran Ouch, MD;  Location: [facility] INVASIVE CV LAB;  Service: Cardiovascular;  Laterality: N/A;   RADIOFREQUENCY ABLATION NERVES  03/2020   on back   TONSILLECTOMY     WRIST FRACTURE SURGERY Right     Current Medications: No outpatient medications have been marked as taking for the 11/06/22 encounter (Appointment) with Sande Rives, MD.     Allergies:    Hydrocodone-acetaminophen, Oxycodone, Metronidazole,  Nortriptyline, Sulfamethoxazole, Amoxicillin, Iodinated contrast media, Luminal [phenobarbital], Mobic [meloxicam], Penicillins, Tramadol, Allopurinol, Ciprofloxacin, Doxycycline, Latex, and Tape   Social History: Social History   Socioeconomic History   Marital status: Married    Spouse name: Not on file   Number of children: 1   Years of education: Not on file   Highest education level: Not on file  Occupational History   Occupation: Retired  Tobacco Use   Smoking status: Never   Smokeless tobacco: Never  Vaping Use   Vaping status: Never Used  Substance and Sexual Activity   Alcohol use: Yes    Comment: occasional   Drug use: Never   Sexual activity: Yes  Other Topics Concern   Not on file  Social History Narrative   Not on file   Social Determinants of Health   Financial Resource Strain: Not on file  Food Insecurity: No Food Insecurity (10/30/2022)   Hunger Vital Sign    Worried About Running Out of Food in the Last Year: Never true    Ran Out of Food in the Last Year: Never true  Transportation Needs: No Transportation Needs (10/30/2022)   PRAPARE - Administrator, Civil Service (Medical): No    Lack of Transportation (Non-Medical): No  Physical  Activity: Not on file  Stress: Stress Concern Present (09/26/2020)   Received from [facility], [facility] of Occupational Health - Occupational Stress Questionnaire    Feeling of Stress : To some extent  Social Connections: Unknown (07/06/2021)   Received from [facility], [facility]   Social Network    Social Network: Not on file     Family History: The patient's family history includes Breast cancer in her maternal aunt; Colon cancer in her paternal grandmother; Heart attack in her father.  ROS:   All other ROS reviewed and negative. Pertinent positives noted in the HPI.     EKGs/Labs/Other Studies Reviewed:   The following studies were personally reviewed by me today:  EKG:   EKG is *** ordered today.        Recent Labs: 10/29/2022: B Natriuretic Peptide 29.4 10/30/2022: ALT 13; BUN 15; Creatinine, Ser 0.88; Hemoglobin 11.6; Platelets 245; Potassium 3.8; Sodium 140; TSH 0.877   Recent Lipid Panel    Component Value Date/Time   CHOL 122 10/30/2022 0111   TRIG 90 10/30/2022 0111   HDL 55 10/30/2022 0111   CHOLHDL 2.2 10/30/2022 0111   VLDL 18 10/30/2022 0111   LDLCALC 49 10/30/2022 0111    Physical Exam:   VS:  There were no vitals taken for this visit.   Wt Readings from Last 3 Encounters:  10/30/22 199 lb 3.2 oz (90.4 kg)  09/27/22 198 lb 12.8 oz (90.2 kg)  08/07/22 194 lb (88 kg)    General: Well nourished, well developed, in no acute distress Head: Atraumatic, normal size  Eyes: PEERLA, EOMI  Neck: Supple, no JVD Endocrine: No thryomegaly Cardiac: Normal S1, S2; RRR; no murmurs, rubs, or gallops Lungs: Clear to auscultation bilaterally, no wheezing, rhonchi or rales  Abd: Soft, nontender, no hepatomegaly  Ext: No edema, pulses 2+ Musculoskeletal: No deformities, BUE and BLE strength normal and equal Skin: Warm and dry, no rashes   Neuro: Alert and oriented to person, place, time, and situation, CNII-XII grossly intact, no focal deficits  Psych: Normal mood and affect   ASSESSMENT:   Jillian [name] is a 77 y.o. female who presents for the following: No diagnosis found.  PLAN:   There are no diagnoses linked to this encounter.  {Are you ordering a CV Procedure (e.g. stress test, cath, DCCV, TEE, etc)?   Press F2        :[id number]}  Disposition: No follow-ups on file.  Medication Adjustments/Labs and Tests Ordered: Current medicines are reviewed at length with the patient today.  Concerns regarding medicines are outlined above.  No orders of the defined types were placed in this encounter.  No orders of the defined types were placed in this encounter.  There are no Patient Instructions on file for this visit.   Time Spent with  Patient: I have spent a total of *** minutes with patient reviewing hospital notes, telemetry, EKGs, labs and examining the patient as well as establishing an assessment and plan that was discussed with the patient.  > 50% of time was spent in direct patient care.  Signed, Lenna Gilford. Flora Lipps, MD, [facility]  [facility]  [address] [place], [place] [address] [phone]  11/03/2022 7:27 PM

## 2022-11-06 ENCOUNTER — Ambulatory Visit: Payer: Medicare Other | Attending: Cardiovascular Disease | Admitting: Cardiovascular Disease

## 2022-11-06 ENCOUNTER — Encounter: Payer: Self-pay | Admitting: Cardiovascular Disease

## 2022-11-06 VITALS — BP 122/72 | HR 53 | Ht 64.0 in | Wt 200.0 lb

## 2022-11-06 DIAGNOSIS — I2510 Atherosclerotic heart disease of native coronary artery without angina pectoris: Secondary | ICD-10-CM | POA: Diagnosis not present

## 2022-11-06 DIAGNOSIS — E782 Mixed hyperlipidemia: Secondary | ICD-10-CM | POA: Diagnosis not present

## 2022-11-06 DIAGNOSIS — I150 Renovascular hypertension: Secondary | ICD-10-CM | POA: Diagnosis not present

## 2022-11-06 NOTE — Patient Instructions (Signed)
Medication Instructions:  STOP CARVEDILOL  *If you need a refill on your cardiac medications before your next appointment, please call your pharmacy*   Lab Work: NONE If you have labs (blood work) drawn today and your tests are completely normal, you will receive your results only by: MyChart Message (if you have MyChart) OR A paper copy in the mail If you have any lab test that is abnormal or we need to change your treatment, we will call you to review the results.   Testing/Procedures: NONE   Follow-Up: At [facility], you and your health needs are our priority.  As part of our continuing mission to provide you with exceptional heart care, we have created designated Provider Care Teams.  These Care Teams include your primary Cardiologist (physician) and Advanced Practice Providers (APPs -  Physician Assistants and Nurse Practitioners) who all work together to provide you with the care you need, when you need it.  We recommend signing up for the patient portal called "MyChart".  Sign up information is provided on this After Visit Summary.  MyChart is used to connect with patients for Virtual Visits (Telemedicine).  Patients are able to view lab/test results, encounter notes, upcoming appointments, etc.  Non-urgent messages can be sent to your provider as well.   To learn more about what you can do with MyChart, go to [email or web address].    Your next appointment:   3 month(s)  Provider:   Reatha Harps, MD     Other Instructions CHECK B/P DAILY GOAL IS LESS THAN 130/80

## 2022-11-11 ENCOUNTER — Telehealth: Payer: Self-pay | Admitting: Physician Assistant

## 2022-11-11 NOTE — Telephone Encounter (Signed)
Note opened in error.

## 2022-11-26 NOTE — H&P (Signed)
TOTAL HIP ADMISSION H&P  Patient is admitted for left total hip arthroplasty.  Subjective:  Chief Complaint: Left hip pain  HPI: Jillian [name], 78 y.o. female, has a history of pain and functional disability in the left hip due to arthritis and patient has failed non-surgical conservative treatments for greater than 12 weeks to include corticosteriod injections and activity modification. Onset of symptoms was gradual, starting >10 years ago with gradually worsening course since that time. The patient noted no past surgery on the left hip. Patient currently rates pain in the left hip at 8 out of 10 with activity. Patient has night pain, worsening of pain with activity and weight bearing, pain that interfers with activities of daily living, and pain with passive range of motion. Patient has evidence of  bone-on-bone arthritis in her left hip with large marginal osteophyte formation  by imaging studies. This condition presents safety issues increasing the risk of falls. There is no current active infection.  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   Hypercholesterolemia 12/14/2019   Diabetes mellitus (HCC) 12/14/2019   Autoimmune disease (HCC) 12/14/2019   Diverticulosis 12/14/2019   Diverticulitis 12/14/2019   Foot pain, bilateral 09/13/2019    Past Medical History:  Diagnosis Date   Allergy    Anxiety    Bowel obstruction (HCC)    Cataract    Diabetes mellitus without complication (HCC)    Diverticulitis    GERD (gastroesophageal reflux disease)    Gout    High cholesterol    Hypertension    Hypothyroidism    IBS  (irritable bowel syndrome)    Neck pain    Spondylosis of lumbar spine     Past Surgical History:  Procedure Laterality Date   ABDOMINAL HYSTERECTOMY     APPENDECTOMY     BREAST BIOPSY     unsure which breast   CARDIAC CATHETERIZATION     x 2   CHOLECYSTECTOMY     CORONARY PRESSURE/FFR STUDY N/A 10/30/2022   Procedure: CORONARY PRESSURE/FFR STUDY;  Surgeon: Iran Ouch, MD;  Location: [facility];  Service: Cardiovascular;  Laterality: N/A;   EXPLORATORY LAPAROTOMY     HERNIA REPAIR     LEFT HEART CATH AND CORONARY ANGIOGRAPHY N/A 10/30/2022   Procedure: LEFT HEART CATH AND CORONARY ANGIOGRAPHY;  Surgeon: Iran Ouch, MD;  Location: [facility];  Service: Cardiovascular;  Laterality: N/A;   RADIOFREQUENCY ABLATION NERVES  03/2020   on back   TONSILLECTOMY     WRIST FRACTURE SURGERY Right     Prior to Admission medications   Medication Sig Start Date End Date Taking? Authorizing Provider  aspirin EC 81 MG tablet Take 1 tablet (81 mg total) by mouth daily. Swallow whole. 10/31/22   Dunn, Tacey Ruiz, PA-C  carvedilol (COREG) 3.125 MG tablet Take 1 tablet (3.125 mg total) by mouth 2 (two) times daily with a meal. 10/30/22   Dunn, Dayna N, PA-C  cyclobenzaprine (FLEXERIL) 10 MG tablet Take 10 mg by mouth 3 (three) times daily as needed for muscle spasms.    [provider]  EUTHYROX 137 MCG tablet  Take 137 mcg by mouth every morning.    [provider]  Famotidine (PEPCID PO) Take by mouth as needed.    [provider]  ibuprofen (ADVIL,MOTRIN) 200 MG tablet Take 600 mg by mouth daily as needed for moderate pain.    [provider]  metFORMIN (GLUCOPHAGE-XR) 500 MG 24 hr tablet Take 1,000 mg by mouth 2 (two) times daily.    [provider]  omeprazole (PRILOSEC) 40 MG capsule Take 40 mg by mouth daily.    [provider]  polyethylene glycol powder (GLYCOLAX/MIRALAX) 17 GM/SCOOP powder Take 1 dose once to twice daily  by mouth in 8 ounces of fluid Patient taking differently: Take 17 g by mouth daily as needed for moderate constipation. 12/23/19   Esterwood, Amy S, PA-C  rosuvastatin (CRESTOR) 20 MG tablet Take 20 mg by mouth daily.    [provider]  telmisartan (MICARDIS) 80 MG tablet Take 1 tablet by mouth daily before breakfast. 10/02/16   [provider]    Allergies  Allergen Reactions   Hydrocodone-Acetaminophen Itching   Oxycodone Itching   Metronidazole Nausea And Vomiting    Flu like symptoms Flu like symptoms   Nortriptyline Other (See Comments)   Sulfamethoxazole Other (See Comments)    Flu-like symptoms.   Amoxicillin     Facial Numbness   Iodinated Contrast Media     rash   Luminal [Phenobarbital] Other (See Comments)    Blisters   Mobic [Meloxicam]    Penicillins     Has patient had a PCN reaction causing immediate rash, facial/tongue/throat swelling, SOB or lightheadedness with hypotension:Y Has patient had a PCN reaction causing severe rash involving mucus membranes or skin necrosis: N Has patient had a PCN reaction that required hospitalization: N Has patient had a PCN reaction occurring within the last 10 years: N If all of the above answers are "NO", then may proceed with Cephalosporin use.    Tramadol Other (See Comments)    Unknown reaction   Allopurinol Rash   Ciprofloxacin Rash   Doxycycline Rash   Latex Rash   Tape Rash    Adhesive tape    Social History   Socioeconomic History   Marital status: Married    Spouse name: Not on file   Number of children: 1   Years of education: Not on file   Highest education level: Not on file  Occupational History   Occupation: Retired  Tobacco Use   Smoking status: Never   Smokeless tobacco: Never  Vaping Use   Vaping status: Never Used  Substance and Sexual Activity   Alcohol use: Yes    Comment: occasional   Drug use: Never   Sexual activity: Yes  Other Topics Concern   Not on file  Social  History Narrative   Not on file   Social Determinants of Health   Financial Resource Strain: Not on file  Food Insecurity: No Food Insecurity (10/30/2022)   Hunger Vital Sign    Worried About Running Out of Food in the Last Year: Never true    Ran Out of Food in the Last Year: Never true  Transportation Needs: No Transportation Needs (10/30/2022)   PRAPARE - Administrator, Civil Service (Medical): No    Lack of Transportation (Non-Medical): No  Physical Activity: Not on file  Stress: Stress Concern Present (09/26/2020)   Received from [facility] - Occupational Stress Questionnaire  Feeling of Stress : To some extent  Social Connections: Unknown (07/06/2021)   Received from [facility]   Social Network    Social Network: Not on file  Intimate Partner Violence: Not At Risk (10/30/2022)   Humiliation, Afraid, Rape, and Kick questionnaire    Fear of Current or Ex-Partner: No    Emotionally Abused: No    Physically Abused: No    Sexually Abused: No    Tobacco Use: Low Risk  (11/06/2022)   Patient History    Smoking Tobacco Use: Never    Smokeless Tobacco Use: Never    Passive Exposure: Not on file   Social History   Substance and Sexual Activity  Alcohol Use Yes   Comment: occasional    Family History  Problem Relation Age of Onset   Heart attack Father    Breast cancer Maternal Aunt    Colon cancer Paternal Grandmother     Review of Systems  Constitutional:  Negative for chills and fever.  HENT:  Negative for congestion, sore throat and tinnitus.   Eyes:  Negative for double vision, photophobia and pain.  Respiratory:  Negative for cough, shortness of breath and wheezing.   Cardiovascular:  Negative for chest pain, palpitations and orthopnea.  Gastrointestinal:  Negative for heartburn, nausea and vomiting.  Genitourinary:  Negative for dysuria, frequency and urgency.   Musculoskeletal:  Positive for joint pain.  Neurological:  Negative for dizziness, weakness and headaches.     Objective:  Physical Exam: Well nourished and well developed.  General: Alert and oriented x3, cooperative and pleasant, no acute distress.  Head: normocephalic, atraumatic, neck supple.  Eyes: EOMI.  Musculoskeletal:   Left hip can flex to 90 degrees with no internal or external rotation, only about 10 degrees of abduction.  Calves soft and nontender. Motor function intact in LE. Strength 5/5 LE bilaterally. Neuro: Distal pulses 2+. Sensation to light touch intact in LE.  Imaging Review Plain radiographs demonstrate severe degenerative joint disease of the left hip. The bone quality appears to be adequate for age and reported activity level.  Assessment/Plan:  End stage arthritis, left hip  The patient history, physical examination, clinical judgement of the provider and imaging studies are consistent with end stage degenerative joint disease of the left hip and total hip arthroplasty is deemed medically necessary. The treatment options including medical management, injection therapy, arthroscopy and arthroplasty were discussed at length. The risks and benefits of total hip arthroplasty were presented and reviewed. The risks due to aseptic loosening, infection, stiffness, dislocation/subluxation, thromboembolic complications and other imponderables were discussed. The patient acknowledged the explanation, agreed to proceed with the plan and consent was signed. Patient is being admitted for inpatient treatment for surgery, pain control, PT, OT, prophylactic antibiotics, VTE prophylaxis, progressive ambulation and ADLs and discharge planning.The patient is planning to be discharged  home .   Patient's anticipated LOS is less than 2 midnights, meeting these requirements: - Lives within 1 hour of care - Has a competent adult at home to recover with post-op recover - NO history  of  - Chronic pain requiring opiods  - Heart failure  - Heart attack  - Stroke  - DVT/VTE  - Cardiac arrhythmia  - Respiratory Failure/COPD  - Renal failure  - Anemia  - Advanced Liver disease  Therapy Plans: HEP Disposition: Home with husband Planned DVT Prophylaxis: Aspirin 81 mg BID DME Needed: Dan Humphreys, 3-in-1 PCP: Geoffry Paradise, MD (clearance received) Cardiologist: Gerri Spore O'Neal (LOV 11/06/2022)  TXA: IV Allergies: Codeine (itching), LATEX Anesthesia Concerns: N/V BMI: 32.8 Last HgbA1c: 6.2% (09/2022) Pain Regimen: Dilaudid PO, IV morphine Pharmacy: [facility] ([address], [place])  Other: - CAD with known blockage - monitored by Dr. Flora Lipps  - Patient was instructed on what medications to stop prior to surgery. - Follow-up visit in 2 weeks with Dr. Lequita Halt - Begin physical therapy following surgery - Pre-operative lab work as pre-surgical testing - Prescriptions will be provided in hospital at time of discharge  Arther Abbott, PA-C Orthopedic Surgery [facility] [place]

## 2022-12-05 ENCOUNTER — Telehealth: Payer: Self-pay | Admitting: Cardiovascular Disease

## 2022-12-05 NOTE — Telephone Encounter (Signed)
3. Renovascular hypertension -Blood pressure is at goal.  Reports intolerance to carvedilol due to swelling.  Stop carvedilol.  Check blood pressure once a day.  She will see me back in 3 months to discuss further.

## 2022-12-05 NOTE — Telephone Encounter (Signed)
Patient called. Notified why MD stopped carvedilol - as copied below. It was left on med list at last visit. This has been updated

## 2022-12-05 NOTE — Telephone Encounter (Signed)
Pt c/o medication issue:  1. Name of Medication:   carvedilol (COREG) 3.125 MG tablet    2. How are you currently taking this medication (dosage and times per day)?   Take 1 tablet (3.125 mg total) by mouth 2 (two) times daily with a meal.    3. Are you having a reaction (difficulty breathing--STAT)? No  4. What is your medication issue? Pt would like to know why she is not supposed to be taking this medication. Please advise

## 2022-12-12 NOTE — Patient Instructions (Addendum)
SURGICAL WAITING ROOM VISITATION Patients having surgery or a procedure may have no more than 2 support people in the waiting area - these visitors may rotate in the visitor waiting room.   Due to an increase in RSV and influenza rates and associated hospitalizations, children ages 42 and under may not visit patients in [facility] hospitals. If the patient needs to stay at the hospital during part of their recovery, the visitor guidelines for inpatient rooms apply.  PRE-OP VISITATION  Pre-op nurse will coordinate an appropriate time for 1 support person to accompany the patient in pre-op.  This support person may not rotate.  This visitor will be contacted when the time is appropriate for the visitor to come back in the pre-op area.  Please refer to the [facility] website for the visitor guidelines for Inpatients (after your surgery is over and you are in a regular room).  You are not required to quarantine at this time prior to your surgery. However, you must do this: Hand Hygiene often Do NOT share personal items Notify your provider if you are in close contact with someone who has COVID or you develop fever 100.4 or greater, new onset of sneezing, cough, sore throat, shortness of breath or body aches.  If you test positive for Covid or have been in contact with anyone that has tested positive in the last 10 days please notify you surgeon.    Your procedure is scheduled on:  Wednesday  December 25, 2022  Report to [facility] Main Entrance: [facility] entrance where the [employer] is available.   Report to admitting at:  05:15   AM  Call this number if you have any questions or problems the morning of surgery [phone]  Do not eat food after Midnight the night prior to your surgery/procedure.  After Midnight you may have the following liquids until    04:30     AM DAY OF SURGERY  Clear Liquid Diet Water Black Coffee (sugar ok, NO MILK/CREAM OR CREAMERS)  Tea (sugar ok,  NO MILK/CREAM OR CREAMERS) regular and decaf                             Plain Jell-O  with no fruit (NO RED)                                           Fruit ices (not with fruit pulp, NO RED)                                     Popsicles (NO RED)                                                                  Juice: NO CITRUS JUICES: only apple, WHITE grape, WHITE cranberry Sports drinks like Gatorade or Powerade (NO RED)                    The day of surgery:  Drink ONE (1) Pre-Surgery  G2 at  04:30     AM the morning of surgery. Drink in one sitting. Do not sip.  This drink was given to you during your hospital pre-op appointment visit. Nothing else to drink after completing the Pre-Surgery G2 : No candy, chewing gum or throat lozenges.    FOLLOW ANY ADDITIONAL PRE OP INSTRUCTIONS YOU RECEIVED FROM YOUR SURGEON'S OFFICE!!!   Oral Hygiene is also important to reduce your risk of infection.        Remember - BRUSH YOUR TEETH THE MORNING OF SURGERY WITH YOUR REGULAR TOOTHPASTE  Do NOT smoke after Midnight the night before surgery.   METFORMIN:  Day BEFORE surgery: take usual dose                  DAY OF SURGERY:  DO NOT TAKE METFORMIN.   STOP TAKING all Vitamins, Herbs and supplements 1 week before your surgery.   Take ONLY these medicines the morning of surgery with A SIP OF WATER: Euthyrox,  You may take Famotidine and Tylenol if needed.              You may not have any metal on your body including hair pins, jewelry, and body piercing  Do not wear make-up, lotions, powders, perfumes  or deodorant  Do not wear nail polish including gel and S&S, artificial / acrylic nails, or any other type of covering on natural nails including finger and toenails. If you have artificial nails, gel coating, etc., that needs to be removed by a nail salon, Please have this removed prior to surgery. Not doing so may mean that your surgery could be cancelled or delayed if the Surgeon or  anesthesia staff feels like they are unable to monitor you safely.   Do not shave 48 hours prior to surgery to avoid nicks in your skin which may contribute to postoperative infections.   You may bring a small overnight bag with you on the day of surgery, only pack items that are not valuable. [facility] IS NOT RESPONSIBLE   FOR VALUABLES THAT ARE LOST OR STOLEN.   Do not bring your home medications to the hospital. The Pharmacy will dispense medications listed on your medication list to you during your admission in the Hospital.   Please read over the following fact sheets you were given: IF YOU HAVE QUESTIONS ABOUT YOUR PRE-OP INSTRUCTIONS, PLEASE CALL [phone].     Pre-operative 5 CHG Bath Instructions   You can play a key role in reducing the risk of infection after surgery. Your skin needs to be as free of germs as possible. You can reduce the number of germs on your skin by washing with CHG (chlorhexidine gluconate) soap before surgery. CHG is an antiseptic soap that kills germs and continues to kill germs even after washing.   DO NOT use if you have an allergy to chlorhexidine/CHG or antibacterial soaps. If your skin becomes reddened or irritated, stop using the CHG and notify one of our RNs at [phone]  Please shower with the CHG soap starting 4 days before surgery using the following schedule: START SHOWERS ON   SATURDAY  December 21, 2022  Please keep in mind the following:  DO NOT shave, including legs and underarms, starting the day of your first shower.   You may shave your face at any point before/day of surgery.   Place clean sheets on your bed the day you start using CHG soap. Use a clean washcloth (not used since being washed) for each shower. DO NOT sleep with pets once you start  using the CHG.   CHG Shower Instructions:  If you choose to wash your hair and private area, wash first with your normal shampoo/soap.  After you use shampoo/soap, rinse your hair and body thoroughly to remove shampoo/soap residue.  Turn the water OFF and apply about 3 tablespoons (45 ml) of CHG soap to a CLEAN washcloth.  Apply CHG soap ONLY FROM YOUR NECK DOWN TO YOUR TOES (washing for 3-5 minutes)  DO NOT use CHG soap on face, private areas, open wounds, or sores.  Pay special attention to the area where your surgery is being performed.  If you are having back surgery, having someone wash your back for you may be helpful.  Wait 2 minutes after CHG soap is applied, then you may rinse off the CHG soap.  Pat dry with a clean towel  Put on clean clothes/pajamas   If you choose to wear lotion, please use ONLY the CHG-compatible lotions on the back of this paper.     Additional instructions for the day of surgery: DO NOT APPLY any lotions, deodorants, cologne, or perfumes.   Put on clean/comfortable clothes.  Brush your teeth.  Ask your nurse before applying any prescription medications to the skin.      CHG Compatible Lotions   Aveeno Moisturizing lotion  Cetaphil Moisturizing Cream  Cetaphil Moisturizing Lotion  Clairol Herbal Essence Moisturizing Lotion, Dry Skin  Clairol Herbal Essence Moisturizing Lotion, Extra Dry Skin  Clairol Herbal Essence Moisturizing Lotion, Normal Skin  Curel Age Defying Therapeutic Moisturizing Lotion with Alpha Hydroxy  Curel Extreme Care Body Lotion  Curel Soothing Hands Moisturizing Hand Lotion  Curel Therapeutic Moisturizing Cream, Fragrance-Free  Curel Therapeutic Moisturizing Lotion, Fragrance-Free  Curel Therapeutic Moisturizing Lotion, Original Formula  Eucerin Daily Replenishing Lotion  Eucerin Dry Skin Therapy Plus Alpha Hydroxy Crme  Eucerin Dry Skin Therapy Plus Alpha Hydroxy Lotion  Eucerin Original Crme  Eucerin Original Lotion   Eucerin Plus Crme Eucerin Plus Lotion  Eucerin TriLipid Replenishing Lotion  Keri Anti-Bacterial Hand Lotion  Keri Deep Conditioning Original Lotion Dry Skin Formula Softly Scented  Keri Deep Conditioning Original Lotion, Fragrance Free Sensitive Skin Formula  Keri Lotion Fast Absorbing Fragrance Free Sensitive Skin Formula  Keri Lotion Fast Absorbing Softly Scented Dry Skin Formula  Keri Original Lotion  Keri Skin Renewal Lotion Keri Silky Smooth Lotion  Keri Silky Smooth Sensitive Skin Lotion  Nivea Body Creamy Conditioning Oil  Nivea Body Extra Enriched Lotion  Nivea Body Original Lotion  Nivea Body Sheer Moisturizing Lotion Nivea Crme  Nivea Skin Firming Lotion  NutraDerm 30 Skin Lotion  NutraDerm Skin Lotion  NutraDerm Therapeutic Skin Cream  NutraDerm Therapeutic Skin Lotion  ProShield Protective Hand Cream  Provon moisturizing lotion   FAILURE TO FOLLOW THESE INSTRUCTIONS MAY RESULT IN THE CANCELLATION OF YOUR SURGERY  PATIENT SIGNATURE_________________________________  NURSE SIGNATURE__________________________________  ________________________________________________________________________       Jillian [name]    An incentive spirometer is a tool that can help keep your lungs clear and active. This tool measures how well you are filling your lungs with each breath. Taking  long deep breaths may help reverse or decrease the chance of developing breathing (pulmonary) problems (especially infection) following: A long period of time when you are unable to move or be active. BEFORE THE PROCEDURE  If the spirometer includes an indicator to show your best effort, your nurse or respiratory therapist will set it to a desired goal. If possible, sit up straight or lean slightly forward. Try not to slouch. Hold the incentive spirometer in an upright position. INSTRUCTIONS FOR USE  Sit on the edge of your bed if possible, or sit up as far as you can in bed or on a  chair. Hold the incentive spirometer in an upright position. Breathe out normally. Place the mouthpiece in your mouth and seal your lips tightly around it. Breathe in slowly and as deeply as possible, raising the piston or the ball toward the top of the column. Hold your breath for 3-5 seconds or for as long as possible. Allow the piston or ball to fall to the bottom of the column. Remove the mouthpiece from your mouth and breathe out normally. Rest for a few seconds and repeat Steps 1 through 7 at least 10 times every 1-2 hours when you are awake. Take your time and take a few normal breaths between deep breaths. The spirometer may include an indicator to show your best effort. Use the indicator as a goal to work toward during each repetition. After each set of 10 deep breaths, practice coughing to be sure your lungs are clear. If you have an incision (the cut made at the time of surgery), support your incision when coughing by placing a pillow or rolled up towels firmly against it. Once you are able to get out of bed, walk around indoors and cough well. You may stop using the incentive spirometer when instructed by your caregiver.  RISKS AND COMPLICATIONS Take your time so you do not get dizzy or light-headed. If you are in pain, you may need to take or ask for pain medication before doing incentive spirometry. It is harder to take a deep breath if you are having pain. AFTER USE Rest and breathe slowly and easily. It can be helpful to keep track of a log of your progress. Your caregiver can provide you with a simple table to help with this. If you are using the spirometer at home, follow these instructions: SEEK MEDICAL CARE IF:  You are having difficultly using the spirometer. You have trouble using the spirometer as often as instructed. Your pain medication is not giving enough relief while using the spirometer. You develop fever of 100.5 F (38.1 C) or higher.                                                                                                     SEEK IMMEDIATE MEDICAL CARE IF:  You cough up bloody sputum that had not been present before. You develop fever of 102 F (38.9 C) or greater. You develop worsening pain at or near the incision site. MAKE SURE YOU:  Understand these instructions. Will watch your condition.  Will get help right away if you are not doing well or get worse. Document Released: 06/24/2006 Document Revised: 05/06/2011 Document Reviewed: 08/25/2006 [facility] Patient Information 2014 [place], [place].      WHAT IS A BLOOD TRANSFUSION? Blood Transfusion Information  A transfusion is the replacement of blood or some of its parts. Blood is made up of multiple cells which provide different functions. Red blood cells carry oxygen and are used for blood loss replacement. White blood cells fight against infection. Platelets control bleeding. Plasma helps clot blood. Other blood products are available for specialized needs, such as hemophilia or other clotting disorders. BEFORE THE TRANSFUSION  Who gives blood for transfusions?  Healthy volunteers who are fully evaluated to make sure their blood is safe. This is blood bank blood. Transfusion therapy is the safest it has ever been in the practice of medicine. Before blood is taken from a donor, a complete history is taken to make sure that person has no history of diseases nor engages in risky social behavior (examples are intravenous drug use or sexual activity with multiple partners). The donor's travel history is screened to minimize risk of transmitting infections, such as malaria. The donated blood is tested for signs of infectious diseases, such as HIV and hepatitis. The blood is then tested to be sure it is compatible with you in order to minimize the chance of a transfusion reaction. If you or a relative donates blood, this is often done in anticipation of surgery and is not appropriate for  emergency situations. It takes many days to process the donated blood. RISKS AND COMPLICATIONS Although transfusion therapy is very safe and saves many lives, the main dangers of transfusion include:  Getting an infectious disease. Developing a transfusion reaction. This is an allergic reaction to something in the blood you were given. Every precaution is taken to prevent this. The decision to have a blood transfusion has been considered carefully by your caregiver before blood is given. Blood is not given unless the benefits outweigh the risks. AFTER THE TRANSFUSION Right after receiving a blood transfusion, you will usually feel much better and more energetic. This is especially true if your red blood cells have gotten low (anemic). The transfusion raises the level of the red blood cells which carry oxygen, and this usually causes an energy increase. The nurse administering the transfusion will monitor you carefully for complications. HOME CARE INSTRUCTIONS  No special instructions are needed after a transfusion. You may find your energy is better. Speak with your caregiver about any limitations on activity for underlying diseases you may have. SEEK MEDICAL CARE IF:  Your condition is not improving after your transfusion. You develop redness or irritation at the intravenous (IV) site. SEEK IMMEDIATE MEDICAL CARE IF:  Any of the following symptoms occur over the next 12 hours: Shaking chills. You have a temperature by mouth above 102 F (38.9 C), not controlled by medicine. Chest, back, or muscle pain. People around you feel you are not acting correctly or are confused. Shortness of breath or difficulty breathing. Dizziness and fainting. You get a rash or develop hives. You have a decrease in urine output. Your urine turns a dark color or changes to pink, red, or brown. Any of the following symptoms occur over the next 10 days: You have a temperature by mouth above 102 F (38.9 C), not  controlled by medicine. Shortness of breath. Weakness after normal activity. The white part of the eye turns yellow (jaundice). You have a  decrease in the amount of urine or are urinating less often. Your urine turns a dark color or changes to pink, red, or brown. Document Released: 02/09/2000 Document Revised: 05/06/2011 Document Reviewed: 09/28/2007 [facility] Patient Information 2014 [place], [place].  _______________________________________________________________________

## 2022-12-12 NOTE — Progress Notes (Addendum)
COVID Vaccine received:  []  No [x]  Yes Date of any COVID positive Test in last 90 days: None  PCP - Geoffry Paradise, MD Cardiologist - Lennie Odor, MD (LOV 11-06-2022)  Cardiac clearance  Chest x-ray - 10-29-2022  2v  Epic EKG -  11-06-2022  Epic Stress Test -  ECHO - 01-14-2022  Epic Cardiac Cath - 10-30-2022  LHC by Dr. Kirke Corin NM PET CT cardiac Perfusion 10-24-2022  Epic  PCR screen: [x]  Ordered & Completed []   No Order but Needs PROFEND     []   N/A for this surgery  Surgery Plan:  []  Ambulatory   [x]  Outpatient in bed  []  Admit Anesthesia:    []  General  []  Spinal  [x]   Choice []   MAC  Pacemaker / ICD device [x]  No []  Yes   Spinal Cord Stimulator:[x]  No []  Yes   had tested one but it didn't work. Nothing in back per patient     History of Sleep Apnea? [x]  No []  Yes   CPAP used?- [x]  No []  Yes    Does the patient monitor blood sugar?   []  N/A   []  No [x]  Yes  Patient has: []  NO Hx DM   []  Pre-DM   []  DM1  [x]   DM2 Last A1c was:  6.6 on  10-30-2022    Does patient have a [employer] or Dexacom? [x]  No []  Yes   Fasting Blood Sugar Ranges- 110-120 Checks Blood Sugar __1___ times a week Other Diabetic medications/ instructions: Metformin 1000mg  bid,   Hold DOS  Blood Thinner / Instructions:None Aspirin Instructions:  ASA 81mg   hold x 7 days per patient  ERAS Protocol Ordered: []  No  [x]  Yes PRE-SURGERY []  ENSURE  [x]  G2   Patient is to be NPO after: 04:30   Dental hx: []  Dentures:  []  N/A      []  Bridge or Partial:                   [x]  Loose or Damaged teeth: some loose teeth on the top in front  Comments: Patient was given the 5 CHG shower / bath instructions for THA surgery along with 2 bottles of the CHG soap. Patient will start this on:   Saturday  12-21-2022  All questions were asked and answered, Patient voiced understanding of this process.   Activity level: Patient is unable to climb a flight of stairs without difficulty but would have  DOE.   Patient can perform ADLs  without assistance.   Anesthesia review: DM2, HTN, Renovascular HTN, Pt was trialed for a spinal cord stimulator but didn't work- has no device in her back, CAD, [place], anemia  Patient denies shortness of breath, fever, cough and chest pain at PAT appointment.  Patient verbalized understanding and agreement to the Pre-Surgical Instructions that were given to them at this PAT appointment. Patient was also educated of the need to review these PAT instructions again prior to her surgery.I reviewed the appropriate phone numbers to call if they have any and questions or concerns.

## 2022-12-13 ENCOUNTER — Encounter (HOSPITAL_COMMUNITY): Payer: Self-pay

## 2022-12-13 ENCOUNTER — Other Ambulatory Visit: Payer: Self-pay

## 2022-12-13 ENCOUNTER — Encounter (HOSPITAL_COMMUNITY)
Admission: RE | Admit: 2022-12-13 | Discharge: 2022-12-13 | Disposition: A | Discharge status: Home / Self Care | Payer: Medicare Other | Source: Ambulatory Visit | Attending: Orthopedic Surgery | Admitting: Orthopedic Surgery

## 2022-12-13 VITALS — BP 142/80 | HR 90 | Temp 97.6°F | Resp 20 | Ht 64.0 in | Wt 194.0 lb

## 2022-12-13 DIAGNOSIS — Z01818 Encounter for other preprocedural examination: Secondary | ICD-10-CM | POA: Diagnosis not present

## 2022-12-13 DIAGNOSIS — Z01812 Encounter for preprocedural laboratory examination: Secondary | ICD-10-CM | POA: Diagnosis not present

## 2022-12-13 DIAGNOSIS — I10 Essential (primary) hypertension: Secondary | ICD-10-CM | POA: Insufficient documentation

## 2022-12-13 DIAGNOSIS — I2510 Atherosclerotic heart disease of native coronary artery without angina pectoris: Secondary | ICD-10-CM | POA: Diagnosis not present

## 2022-12-13 DIAGNOSIS — E119 Type 2 diabetes mellitus without complications: Secondary | ICD-10-CM | POA: Diagnosis not present

## 2022-12-13 DIAGNOSIS — Z79899 Other long term (current) drug therapy: Secondary | ICD-10-CM | POA: Insufficient documentation

## 2022-12-13 DIAGNOSIS — K219 Gastro-esophageal reflux disease without esophagitis: Secondary | ICD-10-CM | POA: Insufficient documentation

## 2022-12-13 DIAGNOSIS — E039 Hypothyroidism, unspecified: Secondary | ICD-10-CM | POA: Insufficient documentation

## 2022-12-13 DIAGNOSIS — M1612 Unilateral primary osteoarthritis, left hip: Secondary | ICD-10-CM | POA: Diagnosis not present

## 2022-12-13 HISTORY — DX: Atherosclerotic heart disease of native coronary artery without angina pectoris: I25.10

## 2022-12-13 HISTORY — DX: Malignant (primary) neoplasm, unspecified: C80.1

## 2022-12-13 HISTORY — DX: Renovascular hypertension: I15.0

## 2022-12-13 HISTORY — DX: Personal history of other diseases of the circulatory system: Z86.79

## 2022-12-13 HISTORY — DX: Personal history of urinary calculi: Z87.442

## 2022-12-13 HISTORY — DX: Other specified postprocedural states: Z98.890

## 2022-12-13 LAB — BASIC METABOLIC PANEL
Anion gap: 12 (ref 5–15)
BUN: 22 mg/dL (ref 8–23)
CO2: 21 mmol/L — ABNORMAL LOW (ref 22–32)
Calcium: 10.2 mg/dL (ref 8.9–10.3)
Chloride: 107 mmol/L (ref 98–111)
Creatinine, Ser: 0.9 mg/dL (ref 0.44–1.00)
GFR, Estimated: >60 mL/min (ref >60)
Glucose, Bld: 110 mg/dL — ABNORMAL HIGH (ref 70–99)
Potassium: 4.7 mmol/L (ref 3.5–5.1)
Sodium: 140 mmol/L (ref 135–145)

## 2022-12-13 LAB — CBC
HCT: 41.7 % (ref 36.0–46.0)
Hemoglobin: 13.2 g/dL (ref 12.0–15.0)
MCH: 29.5 pg (ref 26.0–34.0)
MCHC: 31.7 g/dL (ref 30.0–36.0)
MCV: 93.1 fL (ref 80.0–100.0)
Platelets: 293 10*3/uL (ref 150–400)
RBC: 4.48 MIL/uL (ref 3.87–5.11)
RDW: 13.7 % (ref 11.5–15.5)
WBC: 7.3 10*3/uL (ref 4.0–10.5)
nRBC: 0 % (ref 0.0–0.2)

## 2022-12-13 LAB — TYPE AND SCREEN
ABO/RH(D): A POS
Antibody Screen: NEGATIVE

## 2022-12-13 LAB — SURGICAL PCR SCREEN
MRSA, PCR: NEGATIVE
Staphylococcus aureus: NEGATIVE

## 2022-12-13 LAB — GLUCOSE, CAPILLARY: Glucose-Capillary: 84 mg/dL (ref 70–99)

## 2022-12-13 NOTE — Plan of Care (Signed)
CHL Tonsillectomy/Adenoidectomy, Postoperative PEDS care plan entered in error.

## 2022-12-16 ENCOUNTER — Encounter (HOSPITAL_COMMUNITY): Payer: Self-pay | Admitting: Medical

## 2022-12-16 ENCOUNTER — Encounter (HOSPITAL_COMMUNITY): Payer: Self-pay

## 2022-12-16 NOTE — Progress Notes (Signed)
Case: [id number] Date/Time: 12/25/22 0715   Procedure: TOTAL HIP ARTHROPLASTY ANTERIOR APPROACH (Left: Hip)   Anesthesia type: Choice   Pre-op diagnosis: left hip osteoarthritis   Location: [facility] ROOM 10 / [facility]   Surgeons: Ollen Gross, MD       DISCUSSION: Jillian [name] is a 77 yo female who presents to PAT prior to surgery above. 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  Prior anesthesia complications include PONV  Patient was referred to Cardiology in August due to complaints of chest pain and DOE. She underwent a cardiac PET stress test on 10/24/22 which was abnormal. She then was admitted from 9/3-9/4 due to ongoing chest pain. Underwent LHC on 10/30/22 which showed "50 to 60% proximal LAD lesion that was negative RFR. Her symptoms were attributed to acid reflux." She was advised to continue PPI and Coreg was added but patient couldn't tolerate so it was stopped. She was cleared for surgery onp 11/06/22 office visit: "No chest discomfort today. She will need hip surgery. I am okay with this."  VS: BP (!) 142/80 Comment: right arm sleep  Pulse 90   Temp 36.4 C (Oral)   Resp 20   Ht 5\' 4"  (1.626 m)   Wt 88 kg   SpO2 100%   BMI 33.30 kg/m   PROVIDERS: Geoffry Paradise, MD Cardiology: Lennie Odor, MD  LABS: Labs reviewed: Acceptable for surgery. (all labs ordered are listed, but only abnormal results are displayed)  Labs Reviewed  BASIC METABOLIC PANEL - Abnormal; Notable for the following components:      Result Value   CO2 21 (*)    Glucose, Bld 110 (*)    All other components within normal limits  SURGICAL PCR SCREEN  CBC  GLUCOSE, CAPILLARY  TYPE AND SCREEN     IMAGES:  CXR 10/29/22:   FINDINGS: The heart size and mediastinal contours are within normal limits. Calcified aorta. Hyperinflation. No consolidation, pneumothorax or effusion. No edema. The visualized skeletal structures are unremarkable. Surgical clips in the upper  abdomen.   IMPRESSION: Hyperinflation.  No acute cardiopulmonary disease.   EKG 11/06/22  NSR, rate 82   CV:  LHC 10/30/22:    Prox LAD to Mid LAD lesion is 60% stenosed.   1st Diag lesion is 60% stenosed.   Prox RCA lesion is 30% stenosed.   The left ventricular systolic function is normal.   LV end diastolic pressure is mildly elevated.   The left ventricular ejection fraction is 55-65% by visual estimate.   1.  Moderate bifurcation stenosis involving proximal/mid LAD at the origin of first diagonal which also has moderate stenosis.  The LAD stenosis was interrogated by flow reserve evaluation and was not significant with an RFR of 0.94. 2.  Normal LV systolic function. 3.  Mildly elevated left ventricular end-diastolic pressure at 20 mmHg in the setting of sinus tachycardia and significantly elevated blood pressure.   Recommendations: Recommend aggressive medical therapy.  No indication for PCI.  Stress test 10/24/22 :   Small partially reversible perfusion defect at apex and apical inferior wall, with hypokinesis at apex, suggesting distal LAD territory infarct with peri-infarct ischemia.  Normal myocardial blood flow reserve globally and in LAD territory. Moderate LAD calcifications on CT. No high risk findings such as TID or drop in EF with stress.  Overall, study is low risk, as area of ischemia is small   EKG not recorded during study   LV perfusion is  abnormal. There is evidence of ischemia. There is evidence of infarction. Defect 1: There is a small defect with moderate reduction in uptake present in the apical inferior and apex location(s) that is partially reversible. There is abnormal wall motion in the defect area. Consistent with peri-infarct ischemia.   Rest left ventricular function is normal. Rest EF: 63%. Stress left ventricular function is normal. Stress EF: 72%. End diastolic cavity size is normal. End systolic cavity size is normal.   Myocardial blood flow was  computed to be 2.33ml/g/min at rest and 4.40ml/g/min at stress. Global myocardial blood flow reserve was 2.38 and was normal.   Coronary calcium was present on the attenuation correction CT images. Moderate coronary calcifications were present. Coronary calcifications were present in the left anterior descending artery distribution(s).   Findings are consistent with infarction with peri-infarct ischemia. The study is low risk.   Electronically signed by Epifanio Lesches, MD   Echocardiogram 01/14/2022: Left ventricle cavity is normal in size and wall thickness. Normal global wall motion. Normal LV systolic function with visual EF 60-65%. Doppler evidence of grade I (impaired) diastolic dysfunction, normal LAP. The aortic root is normal. Upper limit normal ascending aorta measuring 3.78 cm. No significant valvular abnormality. Normal right atrial pressure.    Past Medical History:  Diagnosis Date   Allergy    Anxiety    Bowel obstruction (HCC)    Cancer (HCC)    skin cancer had Mohs Surgery   Cataract    Coronary artery disease    Diabetes mellitus without complication (HCC)    Diverticulitis    GERD (gastroesophageal reflux disease)    Gout    H/O unstable angina    High cholesterol    History of kidney stones    Hypertension    Hypothyroidism    IBS (irritable bowel syndrome)    Neck pain    PONV (postoperative nausea and vomiting)    Renovascular hypertension    Spondylosis of lumbar spine     Past Surgical History:  Procedure Laterality Date   ABDOMINAL HYSTERECTOMY     APPENDECTOMY     BLADDER SURGERY     cryo-ablation   BREAST BIOPSY     unsure which breast   CARDIAC CATHETERIZATION     x 2   CHOLECYSTECTOMY     open   CORONARY PRESSURE/FFR STUDY N/A 10/30/2022   Procedure: CORONARY PRESSURE/FFR STUDY;  Surgeon: Iran Ouch, MD;  [facility]: [facility];  Service: Cardiovascular;  Laterality: N/A;   EXPLORATORY LAPAROTOMY     EYE SURGERY  Bilateral    cataract removal   HERNIA REPAIR     x 2,   umbilcal and abdominal incisional with 12" mesh   LEFT HEART CATH AND CORONARY ANGIOGRAPHY N/A 10/30/2022   Procedure: LEFT HEART CATH AND CORONARY ANGIOGRAPHY;  Surgeon: Iran Ouch, MD;  Location: [facility];  Service: Cardiovascular;  Laterality: N/A;   RADIOFREQUENCY ABLATION NERVES  03/2020   on back, trial for possible spinal cord stimulator   TONSILLECTOMY     WRIST FRACTURE SURGERY Right    WRIST GANGLION EXCISION Right     MEDICATIONS:  acetaminophen (TYLENOL) 500 MG tablet   aspirin EC 81 MG tablet   cyclobenzaprine (FLEXERIL) 10 MG tablet   EUTHYROX 137 MCG tablet   famotidine (PEPCID) 20 MG tablet   fluconazole (DIFLUCAN) 100 MG tablet   ibuprofen (ADVIL,MOTRIN) 200 MG tablet   magnesium hydroxide (MILK OF MAGNESIA) 400 MG/5ML  suspension   metFORMIN (GLUCOPHAGE-XR) 500 MG 24 hr tablet   nystatin-triamcinolone (MYCOLOG II) cream   polyethylene glycol powder (GLYCOLAX/MIRALAX) 17 GM/SCOOP powder   Probiotic Product (CULTURELLE PROBIOTICS PO)   rosuvastatin (CRESTOR) 20 MG tablet   telmisartan (MICARDIS) 80 MG tablet   triamcinolone ointment (KENALOG) 0.1 %   Vitamins A & D (VITAMIN A & D) ointment   No current facility-administered medications for this encounter.   Marcille Blanco [facility] Short Stay/Anesthesiology [facility] Phone [phone] 12/16/2022 11:37 AM

## 2022-12-24 DIAGNOSIS — B372 Candidiasis of skin and nail: Secondary | ICD-10-CM | POA: Diagnosis not present

## 2022-12-24 DIAGNOSIS — M1612 Unilateral primary osteoarthritis, left hip: Secondary | ICD-10-CM | POA: Diagnosis not present

## 2022-12-25 ENCOUNTER — Encounter (HOSPITAL_COMMUNITY): Admission: RE | Payer: Self-pay | Source: Ambulatory Visit

## 2022-12-25 ENCOUNTER — Ambulatory Visit (HOSPITAL_COMMUNITY): Admission: RE | Admit: 2022-12-25 | Payer: Medicare Other | Source: Ambulatory Visit | Admitting: Orthopedic Surgery

## 2022-12-25 SURGERY — ARTHROPLASTY, HIP, TOTAL, ANTERIOR APPROACH
Anesthesia: Choice | Site: Hip | Laterality: Left

## 2022-12-27 DIAGNOSIS — T380X5A Adverse effect of glucocorticoids and synthetic analogues, initial encounter: Secondary | ICD-10-CM | POA: Diagnosis not present

## 2022-12-27 DIAGNOSIS — L908 Other atrophic disorders of skin: Secondary | ICD-10-CM | POA: Diagnosis not present

## 2023-01-02 DIAGNOSIS — R21 Rash and other nonspecific skin eruption: Secondary | ICD-10-CM | POA: Diagnosis not present

## 2023-01-16 DIAGNOSIS — L570 Actinic keratosis: Secondary | ICD-10-CM | POA: Diagnosis not present

## 2023-01-16 DIAGNOSIS — L98499 Non-pressure chronic ulcer of skin of other sites with unspecified severity: Secondary | ICD-10-CM | POA: Diagnosis not present

## 2023-01-16 DIAGNOSIS — Z85828 Personal history of other malignant neoplasm of skin: Secondary | ICD-10-CM | POA: Diagnosis not present

## 2023-01-16 DIAGNOSIS — L304 Erythema intertrigo: Secondary | ICD-10-CM | POA: Diagnosis not present

## 2023-01-28 NOTE — Progress Notes (Unsigned)
  Cardiology Office Note:  .   Date:  01/28/2023  ID:  Jillian [name], Jillian [name] 1945/02/27, MRN [id number] PCP: Jillian Paradise, MD  [facility] Providers Cardiologist:  Jillian Harps, MD { Click to update primary MD,subspecialty MD or APP then REFRESH:1}   History of Present Illness: .   Jillian [name] is a 77 y.o. female with history of non-obstructive CAD, HLD, HTN who presents for follow-up.      Problem List DM -A1c 6.6 2. HLD -T chol 122, HDL 55, LDL 49, TG 90, LP(a) 20 3. HTN 4. CAD -60% mid LAD (RFR 0.94)    ROS: All other ROS reviewed and negative. Pertinent positives noted in the HPI.     Studies Reviewed: Marland Kitchen       Physical Exam:   VS:  There were no vitals taken for this visit.   Wt Readings from Last 3 Encounters:  12/13/22 194 lb (88 kg)  11/06/22 200 lb (90.7 kg)  10/30/22 199 lb 3.2 oz (90.4 kg)    GEN: Well nourished, well developed in no acute distress NECK: No JVD; No carotid bruits CARDIAC: ***RRR, no murmurs, rubs, gallops RESPIRATORY:  Clear to auscultation without rales, wheezing or rhonchi  ABDOMEN: Soft, non-tender, non-distended EXTREMITIES:  No edema; No deformity  ASSESSMENT AND PLAN: .   ***    {Are you ordering a CV Procedure (e.g. stress test, cath, DCCV, TEE, etc)?   Press F2        :[id number]}   Follow-up: No follow-ups on file.  Time Spent with Patient: I have spent a total of *** minutes caring for this patient today face to face, ordering and reviewing labs/tests, reviewing prior records/medical history, examining the patient, establishing an assessment and plan, communicating results/findings to the patient/family, and documenting in the medical record.   Signed, Jillian [name]. Flora Lipps, MD, [facility]  [facility]  [address] [place], [place] [address] [phone]  8:49 PM

## 2023-01-29 DIAGNOSIS — L98499 Non-pressure chronic ulcer of skin of other sites with unspecified severity: Secondary | ICD-10-CM | POA: Diagnosis not present

## 2023-01-30 ENCOUNTER — Ambulatory Visit: Payer: Medicare Other | Admitting: Cardiovascular Disease

## 2023-01-30 DIAGNOSIS — E782 Mixed hyperlipidemia: Secondary | ICD-10-CM

## 2023-01-30 DIAGNOSIS — I2510 Atherosclerotic heart disease of native coronary artery without angina pectoris: Secondary | ICD-10-CM

## 2023-01-30 DIAGNOSIS — I150 Renovascular hypertension: Secondary | ICD-10-CM

## 2023-02-05 DIAGNOSIS — Z85828 Personal history of other malignant neoplasm of skin: Secondary | ICD-10-CM | POA: Diagnosis not present

## 2023-02-09 NOTE — Progress Notes (Unsigned)
  Cardiology Office Note:  .   Date:  02/11/2023  ID:  Jillian [name], Jillian [name], MRN [id number] PCP: Geoffry Paradise, MD  J[name] Providers Cardiologist:  Reatha Harps, MD { History of Present Illness: .   Jillian [name] is a 77 y.o. female with history of non-obstructive CAD, DM, HTN who presents for follow-up.   History of Present Illness   Jillian [name], a 77 year old female with a history of nonobstructive CAD, diabetes, and hypertension, presents for a follow-up visit. She reports a delay in her planned surgery due to a dermatological issue, specifically atrophy in her groin area. She has been working with a dermatologist to resolve this issue. Her blood pressure was elevated during the visit, which she attributes to a stressful traffic jam. At home, her blood pressure typically runs around 138/80. She is currently on Micardis 80mg  for hypertension. Her cholesterol levels are well-controlled, and she has not had her diabetes numbers checked since September. She reports no chest pain or breathing difficulties. However, she mentions a recent onset of body aches, which she suspects may be arthritis. She denies any cardiac concerns related to these aches.          Problem List DM -A1c 6.6 2. HLD -T chol 122, HDL 55, LDL 49, TG 90, LP(a) 20 3. HTN 4. CAD -60% mid LAD (RFR 0.94) 10/30/2022     ROS: All other ROS reviewed and negative. Pertinent positives noted in the HPI.     Studies Reviewed: Marland Kitchen       Physical Exam:   VS:  BP (!) 140/98 (BP Location: Left Arm, Patient Position: Sitting, Cuff Size: Normal)   Pulse 100   Ht 5\' 4"  (1.626 m)   Wt 194 lb 9.6 oz (88.3 kg)   SpO2 96%   BMI 33.40 kg/m    Wt Readings from Last 3 Encounters:  02/11/23 194 lb 9.6 oz (88.3 kg)  12/13/22 194 lb (88 kg)  11/06/22 200 lb (90.7 kg)    GEN: Well nourished, well developed in no acute distress NECK: No JVD; No carotid bruits CARDIAC: RRR, no murmurs, rubs,  gallops RESPIRATORY:  Clear to auscultation without rales, wheezing or rhonchi  ABDOMEN: Soft, non-tender, non-distended EXTREMITIES:  No edema; No deformity  ASSESSMENT AND PLAN: .   Assessment and Plan    Nonobstructive CAD HLD No symptoms of angina. LDL is well controlled at 49. -Continue Aspirin 81mg  daily and Crestor 20mg  daily.  Diabetes A1c of 6.6, indicating good control. -Continue current management with primary care physician.  Hypertension Blood pressure slightly elevated at the appointment due to stress, but patient reports home readings within limits.  -Continue Micardis 80mg  daily.  Follow-up in 1 year unless changes occur.              Follow-up: Return in about 1 year (around 02/11/2024).  Time Spent with Patient: I have spent a total of 25 minutes caring for this patient today face to face, ordering and reviewing labs/tests, reviewing prior records/medical history, examining the patient, establishing an assessment and plan, communicating results/findings to the patient/family, and documenting in the medical record.   Signed, Lenna Gilford. Flora Lipps, MD, [facility]  [address] [place], [place] [address] [phone]  9:30 AM

## 2023-02-11 ENCOUNTER — Encounter: Payer: Self-pay | Admitting: Cardiovascular Disease

## 2023-02-11 ENCOUNTER — Ambulatory Visit: Payer: Medicare Other | Attending: Cardiovascular Disease | Admitting: Cardiovascular Disease

## 2023-02-11 VITALS — BP 140/98 | HR 100 | Ht 64.0 in | Wt 194.6 lb

## 2023-02-11 DIAGNOSIS — I150 Renovascular hypertension: Secondary | ICD-10-CM

## 2023-02-11 DIAGNOSIS — I2510 Atherosclerotic heart disease of native coronary artery without angina pectoris: Secondary | ICD-10-CM

## 2023-02-11 DIAGNOSIS — E782 Mixed hyperlipidemia: Secondary | ICD-10-CM

## 2023-02-11 NOTE — Patient Instructions (Signed)
 Medication Instructions:  Your physician recommends that you continue on your current medications as directed. Please refer to the Current Medication list given to you today.    *If you need a refill on your cardiac medications before your next appointment, please call your pharmacy*   Lab Work: None    If you have labs (blood work) drawn today and your tests are completely normal, you will receive your results only by: MyChart Message (if you have MyChart) OR A paper copy in the mail If you have any lab test that is abnormal or we need to change your treatment, we will call you to review the results.   Testing/Procedures: None    Follow-Up: At [facility], you and your health needs are our priority.  As part of our continuing mission to provide you with exceptional heart care, we have created designated Provider Care Teams.  These Care Teams include your primary Cardiologist (physician) and Advanced Practice Providers (APPs -  Physician Assistants and Nurse Practitioners) who all work together to provide you with the care you need, when you need it.  We recommend signing up for the patient portal called "MyChart".  Sign up information is provided on this After Visit Summary.  MyChart is used to connect with patients for Virtual Visits (Telemedicine).  Patients are able to view lab/test results, encounter notes, upcoming appointments, etc.  Non-urgent messages can be sent to your provider as well.   To learn more about what you can do with MyChart, go to [email or web address].    Your next appointment:   1 year(s)  The format for your next appointment:   In Person  Provider:   Edd Fabian, FNP, Micah Flesher, PA-C, Marjie Skiff, PA-C, Robet Leu, PA-C, Juanda Crumble, PA-C, Joni Reining, DNP, ANP, Azalee Course, PA-C, Bernadene Person, NP, or Reather Littler, NP    Other Instructions

## 2023-03-04 ENCOUNTER — Other Ambulatory Visit: Payer: Self-pay | Admitting: Internal Medicine

## 2023-03-04 DIAGNOSIS — Z1231 Encounter for screening mammogram for malignant neoplasm of breast: Secondary | ICD-10-CM

## 2023-03-11 ENCOUNTER — Telehealth: Payer: Self-pay | Admitting: Cardiovascular Disease

## 2023-03-11 NOTE — Telephone Encounter (Signed)
   Pre-operative Risk Assessment    Patient Name: Jillian [name]  DOB: May 12, 1945 MRN: [id number]   Date of last office visit: 02/11/2023   Date of next office visit: none   Request for Surgical Clearance    Procedure:   left total hip arthroplasty  Date of Surgery:  Clearance 04/28/23                                Surgeon:  Dr. Dempsey Moan Surgeon's Group or Practice Name:  [facility] Phone number:  [phone] Fax number:  [phone]   Type of Clearance Requested:   - Medical    Type of Anesthesia:   choice   Additional requests/questions:    SignedJonel LITTIE Bucco   03/11/2023, 10:57 AM

## 2023-03-12 NOTE — Telephone Encounter (Signed)
   Name: Jillian [name]  DOB: [birth year]  MRN: [id number]   Primary Cardiologist: Oneil Bigness, MD  Chart reviewed as part of pre-operative protocol coverage. Patient was contacted 03/12/2023 in reference to pre-operative risk assessment for pending surgery as outlined below.  Jillian [name] was last seen on 02/11/2023 by Dr. Jackquelyn Mass.  Since that day, Jillian [name] has done well from a cardiac standpoint without any new cardiac issues.  Therefore, based on ACC/AHA guidelines, the patient would be at acceptable risk for the planned procedure without further cardiovascular testing.   The patient was advised that if she develops new symptoms prior to surgery to contact our office to arrange for a follow-up visit, and she verbalized understanding.  I will route this recommendation to the requesting party via Epic fax function and remove from pre-op pool. Please call with questions.  Friddie Jetty, NP 03/12/2023, 2:36 PM

## 2023-03-13 DIAGNOSIS — K080 Exfoliation of teeth due to systemic causes: Secondary | ICD-10-CM | POA: Diagnosis not present

## 2023-04-01 NOTE — H&P (Signed)
 TOTAL HIP ADMISSION H&P  Patient is admitted for left total hip arthroplasty.  Subjective:  Chief Complaint: Left hip pain  HPI: Jillian [name], 78 y.o. female, has a history of pain and functional disability in the left hip due to arthritis and patient has failed non-surgical conservative treatments for greater than 12 weeks to include NSAID's and/or analgesics, use of assistive devices, and activity modification. Onset of symptoms was gradual, starting  several  years ago with gradually worsening course since that time. The patient noted no past surgery on the left hip. Patient currently rates pain in the left hip at 8 out of 10 with activity. Patient has night pain, worsening of pain with activity and weight bearing, pain that interfers with activities of daily living, and pain with passive range of motion. Patient has evidence of  bone-on-bone arthritis in her left hip  by imaging studies. This condition presents safety issues increasing the risk of falls. There is no current active infection.  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   Hypercholesterolemia 12/14/2019   Diabetes mellitus (HCC) 12/14/2019   Autoimmune disease (HCC) 12/14/2019   Diverticulosis 12/14/2019   Diverticulitis 12/14/2019   Foot pain, bilateral 09/13/2019    Past Medical History:  Diagnosis Date   Allergy     Anxiety    Bowel obstruction (HCC)    Cancer (HCC)    skin cancer had Mohs Surgery   Cataract    Coronary artery disease    Diabetes mellitus without complication (HCC)    Diverticulitis    GERD (gastroesophageal reflux disease)     Gout    H/O unstable angina    High cholesterol    History of kidney stones    Hypertension    Hypothyroidism    IBS (irritable bowel syndrome)    Neck pain    PONV (postoperative nausea and vomiting)    Renovascular hypertension    Spondylosis of lumbar spine     Past Surgical History:  Procedure Laterality Date   ABDOMINAL HYSTERECTOMY     APPENDECTOMY     BLADDER SURGERY     cryo-ablation   BREAST BIOPSY     unsure which breast   CARDIAC CATHETERIZATION     x 2   CHOLECYSTECTOMY     open   CORONARY PRESSURE/FFR STUDY N/A 10/30/2022   Procedure: CORONARY PRESSURE/FFR STUDY;  Surgeon: Darron Deatrice LABOR, MD;  Location: [facility];  Service: Cardiovascular;  Laterality: N/A;   EXPLORATORY LAPAROTOMY     EYE SURGERY Bilateral    cataract removal   HERNIA REPAIR     x 2,   umbilcal and abdominal incisional with 12 mesh   LEFT HEART CATH AND CORONARY ANGIOGRAPHY N/A 10/30/2022   Procedure: LEFT HEART CATH AND CORONARY ANGIOGRAPHY;  Surgeon: Darron Deatrice LABOR, MD;  Location: [facility];  Service: Cardiovascular;  Laterality: N/A;   RADIOFREQUENCY ABLATION NERVES  03/2020   on back, trial for possible spinal cord stimulator   TONSILLECTOMY     WRIST FRACTURE SURGERY Right    WRIST GANGLION EXCISION Right     Prior to Admission medications  Medication Sig Start Date End Date Taking? Authorizing Provider  acetaminophen  (TYLENOL ) 500 MG tablet Take 500 mg by mouth every 6 (six) hours as needed for moderate pain.    [provider]  aspirin  EC 81 MG tablet Take 1 tablet (81 mg total) by mouth daily. Swallow whole. 10/31/22   Dunn, Dayna N, PA-C  cyclobenzaprine  (FLEXERIL ) 10 MG tablet Take 10 mg by mouth 3 (three) times daily as needed for muscle spasms.    [provider]  EUTHYROX  137 MCG tablet Take 137 mcg by mouth daily before breakfast.    [provider]  famotidine (PEPCID) 20 MG tablet Take 20 mg by mouth daily as needed for  heartburn or indigestion.    [provider]  fluconazole (DIFLUCAN) 100 MG tablet Take 100 mg by mouth See admin instructions. Take 100 mg by mouth daily for 3 days, then take 100 mg once weekly for 3 weeks.    [provider]  ibuprofen (ADVIL,MOTRIN) 200 MG tablet Take 400 mg by mouth every 6 (six) hours as needed for moderate pain.    [provider]  magnesium  hydroxide (MILK OF MAGNESIA) 400 MG/5ML suspension Take 15 mLs by mouth daily as needed for moderate constipation.    [provider]  metFORMIN (GLUCOPHAGE-XR) 500 MG 24 hr tablet Take 1,000 mg by mouth 2 (two) times daily.    [provider]  nystatin-triamcinolone  (MYCOLOG II) cream Apply 1 Application topically 2 (two) times daily.    [provider]  polyethylene glycol powder (GLYCOLAX /MIRALAX ) 17 GM/SCOOP powder Take 1 dose once to twice daily by mouth in 8 ounces of fluid Patient taking differently: Take 17 g by mouth daily as needed for moderate constipation. 12/23/19   Esterwood, Amy S, PA-C  Probiotic Product (CULTURELLE PROBIOTICS PO) Take 1 capsule by mouth daily.    [provider]  rosuvastatin  (CRESTOR ) 20 MG tablet Take 20 mg by mouth daily.    [provider]  telmisartan (MICARDIS) 80 MG tablet Take 80 mg by mouth daily before breakfast. 10/02/16   [provider]  triamcinolone  ointment (KENALOG ) 0.1 % Apply 1 Application topically daily. Applies vaginally    [provider]  Vitamins A & D (VITAMIN A & D) ointment Apply 1 Application topically as needed (irritation).    [provider]    Allergies  Allergen Reactions   Hydrocodone-Acetaminophen  Itching   Oxycodone  Itching   Metronidazole Nausea And Vomiting    Flu like symptoms Flu like symptoms   Nortriptyline Other (See Comments)   Sulfamethoxazole Other (See Comments)    Flu-like symptoms.   Amoxicillin     Facial Numbness   Iodinated Contrast Media     rash    Luminal [Phenobarbital] Other (See Comments)    Blisters   Mobic [Meloxicam]    Penicillins     Has patient had a PCN reaction causing immediate rash, facial/tongue/throat swelling, SOB or lightheadedness with hypotension:Y Has patient had a PCN reaction causing severe rash involving mucus membranes or skin necrosis: N Has patient had a PCN reaction that required hospitalization: N Has patient had a PCN reaction occurring within the last 10 years: N If all of the above answers are NO, then may proceed with Cephalosporin use.    Tramadol  Other (See Comments)    Unknown reaction   Allopurinol Rash   Ciprofloxacin Rash   Doxycycline Rash   Latex Rash   Tape Rash    Adhesive tape  Social History   Socioeconomic History   Marital status: Married    Spouse name: Not on file   Number of children: 1   Years of education: Not on file   Highest education level: Not on file  Occupational History   Occupation: Retired  Tobacco Use   Smoking status: Never   Smokeless tobacco: Never  Vaping Use   Vaping status: Never Used  Substance and Sexual Activity   Alcohol  use: Yes    Comment: occasional   Drug use: Never   Sexual activity: Yes  Other Topics Concern   Not on file  Social History Narrative   Not on file   Social Drivers of Health   Financial Resource Strain: Not on file  Food Insecurity: No Food Insecurity (10/30/2022)   Hunger Vital Sign    Worried About Running Out of Food in the Last Year: Never true    Ran Out of Food in the Last Year: Never true  Transportation Needs: No Transportation Needs (10/30/2022)   PRAPARE - Administrator, Civil Service (Medical): No    Lack of Transportation (Non-Medical): No  Physical Activity: Not on file  Stress: Stress Concern Present (09/26/2020)   Received from [facility], [facility] Occup[facility] - Occupational Stress Questionnaire    Feeling of Stress : To some extent  Social  Connections: Unknown (07/06/2021)   Received from [facility], [facility]   Social Network    Social Network: Not on file  Intimate Partner Violence: Not At Risk (10/30/2022)   Humiliation, Afraid, Rape, and Kick questionnaire    Fear of Current or Ex-Partner: No    Emotionally Abused: No    Physically Abused: No    Sexually Abused: No    Tobacco Use: Low Risk  (02/11/2023)   Patient History    Smoking Tobacco Use: Never    Smokeless Tobacco Use: Never    Passive Exposure: Not on file   Social History   Substance and Sexual Activity  Alcohol  Use Yes   Comment: occasional    Family History  Problem Relation Age of Onset   Heart attack Father    Breast cancer Maternal Aunt    Colon cancer Paternal Grandmother     Review of Systems  Constitutional:  Negative for chills and fever.  HENT:  Negative for congestion, sore throat and tinnitus.   Eyes:  Negative for double vision, photophobia and pain.  Respiratory:  Negative for cough, shortness of breath and wheezing.   Cardiovascular:  Negative for chest pain, palpitations and orthopnea.  Gastrointestinal:  Negative for heartburn, nausea and vomiting.  Genitourinary:  Negative for dysuria, frequency and urgency.  Musculoskeletal:  Positive for joint pain.  Neurological:  Negative for dizziness, weakness and headaches.     Objective:  Physical Exam:  Well nourished and well developed.  General: Alert and oriented x3, cooperative and pleasant, no acute distress.  Head: normocephalic, atraumatic, neck supple.  Eyes: EOMI.  Musculoskeletal:  Left hip can flex to 90 degrees with no internal or external rotation, only about 10 degrees of abduction.  Calves soft and nontender. Motor function intact in LE. Strength 5/5 LE bilaterally. Neuro: Distal pulses 2+. Sensation to light touch intact in LE.   Imaging Review Plain radiographs demonstrate severe degenerative joint disease of the left hip. The bone quality appears  to be adequate for age and reported activity level.  Assessment/Plan:  End stage arthritis, left hip  The patient history, physical examination, clinical judgement of the provider and imaging studies are consistent with end stage degenerative joint disease of the left hip and total hip arthroplasty is deemed medically necessary. The treatment options including medical management, injection therapy, arthroscopy and arthroplasty were discussed at length. The risks and benefits of total hip arthroplasty were presented and reviewed. The risks due to aseptic loosening, infection, stiffness, dislocation/subluxation, thromboembolic complications and other imponderables were discussed. The patient acknowledged the explanation, agreed to proceed with the plan and consent was signed. Patient is being admitted for inpatient treatment for surgery, pain control, PT, OT, prophylactic antibiotics, VTE prophylaxis, progressive ambulation and ADLs and discharge planning.The patient is planning to be discharged  home .   Patient's anticipated LOS is less than 2 midnights, meeting these requirements: - Younger than 36 - Lives within 1 hour of care - Has a competent adult at home to recover with post-op recover - NO history of  - Chronic pain requiring opiods  - Diabetes  - Coronary Artery Disease  - Heart failure  - Heart attack  - Stroke  - DVT/VTE  - Cardiac arrhythmia  - Respiratory Failure/COPD  - Renal failure  - Anemia  - Advanced Liver disease  Therapy Plans: HEP Disposition: Home with husband Planned DVT Prophylaxis: Aspirin  81 mg BID DME Needed: [facility], 3-in-1 PCP: Charlie Love, MD (requested cardiac clearance) Cardiologist: Darryle Decent, MD (clearance received) TXA: IV Allergies: Oxycodone , vicodin, LATEX Anesthesia Concerns: N/V BMI: 33.7 Last HgbA1c: Not diabetic Pain Regimen: Dilaudid  Pharmacy: [facility] (have brought to room)  Other: - Has a chronic rash affecting her groin, seen by  dermatologist Dr. Rolan Molt. He gave her clearance to move forward with surgery in January, said rash was stable. I also checked this today, should not interfere with surgery unless worsens in the upcoming weeks. - Questioning a HHPT being sent out  - Patient was instructed on what medications to stop prior to surgery. - Follow-up visit in 2 weeks with Dr. Melodi - Begin physical therapy following surgery - Pre-operative lab work as pre-surgical testing - Prescriptions will be provided in hospital at time of discharge  Roxie Mess, PA-C Orthopedic Surgery [facility] [place]

## 2023-04-09 DIAGNOSIS — E1149 Type 2 diabetes mellitus with other diabetic neurological complication: Secondary | ICD-10-CM | POA: Diagnosis not present

## 2023-04-09 DIAGNOSIS — R300 Dysuria: Secondary | ICD-10-CM | POA: Diagnosis not present

## 2023-04-14 ENCOUNTER — Ambulatory Visit
Admission: RE | Admit: 2023-04-14 | Discharge: 2023-04-14 | Disposition: A | Discharge status: Home / Self Care | Payer: Medicare Other | Source: Ambulatory Visit | Attending: Internal Medicine | Admitting: Internal Medicine

## 2023-04-14 DIAGNOSIS — Z1231 Encounter for screening mammogram for malignant neoplasm of breast: Secondary | ICD-10-CM | POA: Diagnosis not present

## 2023-04-16 ENCOUNTER — Encounter (HOSPITAL_COMMUNITY): Admission: RE | Admit: 2023-04-16 | Payer: Medicare Other | Source: Ambulatory Visit

## 2023-04-16 DIAGNOSIS — E1140 Type 2 diabetes mellitus with diabetic neuropathy, unspecified: Secondary | ICD-10-CM | POA: Diagnosis not present

## 2023-04-16 NOTE — Patient Instructions (Addendum)
 SURGICAL WAITING ROOM VISITATION  Patients having surgery or a procedure may have no more than 2 support people in the waiting area - these visitors may rotate.    Children under the age of 76 must have an adult with them who is not the patient.  Due to an increase in RSV and influenza rates and associated hospitalizations, children ages 2 and under may not visit patients in [facility] hospitals.  Visitors with respiratory illnesses are discouraged from visiting and should remain at home.  If the patient needs to stay at the hospital during part of their recovery, the visitor guidelines for inpatient rooms apply. Pre-op nurse will coordinate an appropriate time for 1 support person to accompany patient in pre-op.  This support person may not rotate.    Please refer to the [facility] website for the visitor guidelines for Inpatients (after your surgery is over and you are in a regular room).       Your procedure is scheduled on: 04-28-23   Report to [facility] Main Entrance    Report to admitting at     11:15  AM   Call this number if you have problems the morning of surgery [phone]   Do not eat food :After Midnight.   After Midnight you may have the following liquids until 10:45 AM then nothing by mouth   Water Non-Citrus Juices (without pulp, NO RED-Apple, White grape, White cranberry) Black Coffee (NO MILK/CREAM OR CREAMERS, sugar ok)  Clear Tea (NO MILK/CREAM OR CREAMERS, sugar ok) regular and decaf                             Plain Jell-O (NO RED)                                           Fruit ices (not with fruit pulp, NO RED)                                     Popsicles (NO RED)                                                               Sports drinks like Gatorade (NO RED)                   The day of surgery:  Drink ONE (1) Pre-Surgery  G2 BY 10:45  AM the morning of surgery.  Drink in one sitting. Do not sip.  This drink was given to you during  your hospital  pre-op appointment visit. Nothing else to drink after completing the Pre-Surgery G2.          If you have questions, please contact your surgeon's office.   FOLLOW  ANY ADDITIONAL PRE OP INSTRUCTIONS YOU RECEIVED FROM YOUR SURGEON'S OFFICE!!!     Oral Hygiene is also important to reduce your risk of infection.  Remember - BRUSH YOUR TEETH THE MORNING OF SURGERY WITH YOUR REGULAR TOOTHPASTE  DENTURES WILL BE REMOVED PRIOR TO SURGERY PLEASE DO NOT APPLY "Poly grip" OR ADHESIVES!!!   Do NOT smoke after Midnight   Stop all vitamins and herbal supplements 7 days before surgery.   Take these medicines the morning of surgery with A SIP OF WATER:  Rosuvastatin Omeprazole Euthyrox Allegra Tylenol if needed    How to Manage Your Diabetes Before and After Surgery  Why is it important to control my blood sugar before and after surgery? Improving blood sugar levels before and after surgery helps healing and can limit problems. A way of improving blood sugar control is eating a healthy diet by:  Eating less sugar and carbohydrates  Increasing activity/exercise  Talking with your doctor about reaching your blood sugar goals High blood sugars (greater than 180 mg/dL) can raise your risk of infections and slow your recovery, so you will need to focus on controlling your diabetes during the weeks before surgery. Make sure that the doctor who takes care of your diabetes knows about your planned surgery including the date and location.  How do I manage my blood sugar before surgery? Check your blood sugar at least 4 times a day, starting 2 days before surgery, to make sure that the level is not too high or low. Check your blood sugar the morning of your surgery when you wake up and every 2 hours until you get to the Short Stay unit. If your blood sugar is less than 70 mg/dL, you will need to treat for low blood sugar: Do not take insulin. Treat  a low blood sugar (less than 70 mg/dL) with  cup of clear juice (cranberry or apple), 4 glucose tablets, OR glucose gel. Recheck blood sugar in 15 minutes after treatment (to make sure it is greater than 70 mg/dL). If your blood sugar is not greater than 70 mg/dL on recheck, call [phone] for further instructions. Report your blood sugar to the short stay nurse when you get to Short Stay.  If you are admitted to the hospital after surgery: Your blood sugar will be checked by the staff and you will probably be given insulin after surgery (instead of oral diabetes medicines) to make sure you have good blood sugar levels. The goal for blood sugar control after surgery is 80-180 mg/dL.   WHAT DO I DO ABOUT MY DIABETES MEDICATION?  Do not take oral diabetes medicines (pills) the morning of surgery.                                You may not have any metal on your body including hair pins, jewelry, and body piercing             Do not wear make-up, lotions, powders, perfumes/cologne, or deodorant  Do not wear nail polish including gel and S&S, artificial/acrylic nails, or any other type of covering on natural nails including finger and toenails. If you have artificial nails, gel coating, etc. that needs to be removed by a nail salon please have this removed prior to surgery or surgery may need to be canceled/ delayed if the surgeon/ anesthesia feels like they are unable to be safely monitored.   Do not shave 5 days prior to surgery.           Do not bring valuables to the hospital. [place] IS NOT  RESPONSIBLE  FOR VALUABLES.   Contacts, glasses, dentures or bridgework may not be worn into surgery.   Bring small overnight bag day of surgery.   DO NOT BRING YOUR HOME MEDICATIONS TO THE HOSPITAL. PHARMACY WILL DISPENSE MEDICATIONS LISTED ON YOUR MEDICATION LIST TO YOU DURING YOUR ADMISSION IN THE HOSPITAL!              Please read over the following fact sheets you were given: IF YOU  HAVE QUESTIONS ABOUT YOUR PRE-OP INSTRUCTIONS PLEASE CALL [phone] Gwen    If you test positive for Covid or have been in contact with anyone that has tested positive in the last 10 days please notify you surgeon.  [place] - Preparing for Surgery Before surgery, you can play an important role.  Because skin is not sterile, your skin needs to be as free of germs as possible.  You can reduce the number of germs on your skin by washing with CHG (chlorahexidine gluconate) soap before surgery.  CHG is an antiseptic cleaner which kills germs and bonds with the skin to continue killing germs even after washing. Please DO NOT use if you have an allergy to CHG or antibacterial soaps.  If your skin becomes reddened/irritated stop using the CHG and inform your nurse when you arrive at Short Stay. Do not shave (including legs and underarms) for at least 48 hours prior to the first CHG shower.  You may shave your face/neck.  Please follow these instructions carefully:  1.  Shower with CHG Soap the night before surgery and the  morning of surgery.  2.  If you choose to wash your hair, wash your hair first as usual with your normal  shampoo.  3.  After you shampoo, rinse your hair and body thoroughly to remove the shampoo.                             4.  Use CHG as you would any other liquid soap.  You can apply chg directly to the skin and wash.  Gently with a scrungie or clean washcloth.  5.  Apply the CHG Soap to your body ONLY FROM THE NECK DOWN.   Do   not use on face/ open                           Wound or open sores. Avoid contact with eyes, ears mouth and   genitals (private parts).                       Wash face,  Genitals (private parts) with your normal soap.             6.  Wash thoroughly, paying special attention to the area where your    surgery  will be performed.  7.  Thoroughly rinse your body with warm water from the neck down.  8.  DO NOT shower/wash with your normal soap after  using and rinsing off the CHG Soap.                9.  Pat yourself dry with a clean towel.            10.  Wear clean pajamas.            11.  Place clean sheets on your bed the night of your first shower and do not  sleep  with pets. Day of Surgery : Do not apply any lotions/deodorants the morning of surgery.  Please wear clean clothes to the hospital/surgery center.  FAILURE TO FOLLOW THESE INSTRUCTIONS MAY RESULT IN THE CANCELLATION OF YOUR SURGERY  PATIENT SIGNATURE_________________________________  NURSE SIGNATURE__________________________________  ________________________________________________________________________    WHAT IS A BLOOD TRANSFUSION? Blood Transfusion Information  A transfusion is the replacement of blood or some of its parts. Blood is made up of multiple cells which provide different functions. Red blood cells carry oxygen and are used for blood loss replacement. White blood cells fight against infection. Platelets control bleeding. Plasma helps clot blood. Other blood products are available for specialized needs, such as hemophilia or other clotting disorders. BEFORE THE TRANSFUSION  Who gives blood for transfusions?  Healthy volunteers who are fully evaluated to make sure their blood is safe. This is blood bank blood. Transfusion therapy is the safest it has ever been in the practice of medicine. Before blood is taken from a donor, a complete history is taken to make sure that person has no history of diseases nor engages in risky social behavior (examples are intravenous drug use or sexual activity with multiple partners). The donor's travel history is screened to minimize risk of transmitting infections, such as malaria. The donated blood is tested for signs of infectious diseases, such as HIV and hepatitis. The blood is then tested to be sure it is compatible with you in order to minimize the chance of a transfusion reaction. If you or a relative donates  blood, this is often done in anticipation of surgery and is not appropriate for emergency situations. It takes many days to process the donated blood. RISKS AND COMPLICATIONS Although transfusion therapy is very safe and saves many lives, the main dangers of transfusion include:  Getting an infectious disease. Developing a transfusion reaction. This is an allergic reaction to something in the blood you were given. Every precaution is taken to prevent this. The decision to have a blood transfusion has been considered carefully by your caregiver before blood is given. Blood is not given unless the benefits outweigh the risks. AFTER THE TRANSFUSION Right after receiving a blood transfusion, you will usually feel much better and more energetic. This is especially true if your red blood cells have gotten low (anemic). The transfusion raises the level of the red blood cells which carry oxygen, and this usually causes an energy increase. The nurse administering the transfusion will monitor you carefully for complications. HOME CARE INSTRUCTIONS  No special instructions are needed after a transfusion. You may find your energy is better. Speak with your caregiver about any limitations on activity for underlying diseases you may have. SEEK MEDICAL CARE IF:  Your condition is not improving after your transfusion. You develop redness or irritation at the intravenous (IV) site. SEEK IMMEDIATE MEDICAL CARE IF:  Any of the following symptoms occur over the next 12 hours: Shaking chills. You have a temperature by mouth above 102 F (38.9 C), not controlled by medicine. Chest, back, or muscle pain. People around you feel you are not acting correctly or are confused. Shortness of breath or difficulty breathing. Dizziness and fainting. You get a rash or develop hives. You have a decrease in urine output. Your urine turns a dark color or changes to pink, red, or brown. Any of the following symptoms occur over  the next 10 days: You have a temperature by mouth above 102 F (38.9 C), not controlled by medicine. Shortness of breath. Weakness  after normal activity. The white part of the eye turns yellow (jaundice). You have a decrease in the amount of urine or are urinating less often. Your urine turns a dark color or changes to pink, red, or brown. Document Released: 02/09/2000 Document Revised: 05/06/2011 Document Reviewed: 09/28/2007 [employer] Patient Information 2014 [place], [place].  _______________________________________________________________________  Incentive Spirometer  An incentive spirometer is a tool that can help keep your lungs clear and active. This tool measures how well you are filling your lungs with each breath. Taking long deep breaths may help reverse or decrease the chance of developing breathing (pulmonary) problems (especially infection) following: A long period of time when you are unable to move or be active. BEFORE THE PROCEDURE  If the spirometer includes an indicator to show your best effort, your nurse or respiratory therapist will set it to a desired goal. If possible, sit up straight or lean slightly forward. Try not to slouch. Hold the incentive spirometer in an upright position. INSTRUCTIONS FOR USE  Sit on the edge of your bed if possible, or sit up as far as you can in bed or on a chair. Hold the incentive spirometer in an upright position. Breathe out normally. Place the mouthpiece in your mouth and seal your lips tightly around it. Breathe in slowly and as deeply as possible, raising the piston or the ball toward the top of the column. Hold your breath for 3-5 seconds or for as long as possible. Allow the piston or ball to fall to the bottom of the column. Remove the mouthpiece from your mouth and breathe out normally. Rest for a few seconds and repeat Steps 1 through 7 at least 10 times every 1-2 hours when you are awake. Take your time and take a few  normal breaths between deep breaths. The spirometer may include an indicator to show your best effort. Use the indicator as a goal to work toward during each repetition. After each set of 10 deep breaths, practice coughing to be sure your lungs are clear. If you have an incision (the cut made at the time of surgery), support your incision when coughing by placing a pillow or rolled up towels firmly against it. Once you are able to get out of bed, walk around indoors and cough well. You may stop using the incentive spirometer when instructed by your caregiver.  RISKS AND COMPLICATIONS Take your time so you do not get dizzy or light-headed. If you are in pain, you may need to take or ask for pain medication before doing incentive spirometry. It is harder to take a deep breath if you are having pain. AFTER USE Rest and breathe slowly and easily. It can be helpful to keep track of a log of your progress. Your caregiver can provide you with a simple table to help with this. If you are using the spirometer at home, follow these instructions: SEEK MEDICAL CARE IF:  You are having difficultly using the spirometer. You have trouble using the spirometer as often as instructed. Your pain medication is not giving enough relief while using the spirometer. You develop fever of 100.5 F (38.1 C) or higher. SEEK IMMEDIATE MEDICAL CARE IF:  You cough up bloody sputum that had not been present before. You develop fever of 102 F (38.9 C) or greater. You develop worsening pain at or near the incision site. MAKE SURE YOU:  Understand these instructions. Will watch your condition. Will get help right away if you are not doing well  or get worse. Document Released: 06/24/2006 Document Revised: 05/06/2011 Document Reviewed: 08/25/2006 [facility] Patient Information 2014 [place], [place].   ________________________________________________________________________

## 2023-04-17 ENCOUNTER — Encounter (HOSPITAL_COMMUNITY): Admission: RE | Admit: 2023-04-17 | Payer: Medicare Other | Source: Ambulatory Visit

## 2023-04-17 NOTE — Progress Notes (Addendum)
 COVID Vaccine Completed:  Yes  Date of COVID positive in last 90 days:  No  PCP - Geoffry Paradise, MD ( note on chart) Cardiologist - Lennie Odor, MD  Cardiac clearance in Epic dated 03-12-23  Chest x-ray - 10-29-22 Epic EKG - 11-06-22 Epic Stress Test - Yes, several years ago ECHO - 01-14-22 Epic Cardiac Cath - 10-30-22 Epic Pacemaker/ICD device last checked: Spinal Cord Stimulator:  No longer has  Cardiac CT - 10-24-22 Epic  Bowel Prep -  N/A  Sleep Study - N/A CPAP -   Fasting Blood Sugar -  Checks Blood Sugar - checks rarely  Last dose of GLP1 agonist-  N/A GLP1 instructions:  Hold 7 days before surgery    Last dose of SGLT-2 inhibitors-  N/A SGLT-2 instructions:  Hold 3 days before surgery   Blood Thinner Instructions:  Last dose:   Time: Aspirin Instructions:  ASA 81.  Per patient to hold 5 days before surgery Last Dose:  Activity level:  Can go up a flight of stairs and perform activities of daily living without stopping and without symptoms of chest pain or shortness of breath.  Limitations due to hip pain.  Anesthesia review:  CAD, HTN, DM  Patient states that she is having burning and itching in her vaginal area and some soreness on her buttocks.  She was advised to notify Dr. Lequita Halt and her dermatologist prior to surgery, pt stated understanding.  Patient denies shortness of breath, fever, cough and chest pain at PAT appointment  Patient verbalized understanding of instructions that were given to them at the PAT appointment. Patient was also instructed that they will need to review over the PAT instructions again at home before surgery.

## 2023-04-21 ENCOUNTER — Other Ambulatory Visit (HOSPITAL_COMMUNITY): Payer: Medicare Other

## 2023-04-21 ENCOUNTER — Other Ambulatory Visit: Payer: Self-pay

## 2023-04-21 ENCOUNTER — Encounter (HOSPITAL_COMMUNITY)
Admission: RE | Admit: 2023-04-21 | Discharge: 2023-04-21 | Disposition: A | Discharge status: Home / Self Care | Payer: Medicare Other | Source: Ambulatory Visit | Attending: Orthopedic Surgery | Admitting: Orthopedic Surgery

## 2023-04-21 ENCOUNTER — Encounter (HOSPITAL_COMMUNITY): Payer: Medicare Other

## 2023-04-21 ENCOUNTER — Encounter (HOSPITAL_COMMUNITY): Payer: Self-pay

## 2023-04-21 VITALS — BP 156/89 | HR 92 | Temp 98.3°F | Resp 16 | Ht 63.0 in | Wt 186.0 lb

## 2023-04-21 DIAGNOSIS — I2510 Atherosclerotic heart disease of native coronary artery without angina pectoris: Secondary | ICD-10-CM | POA: Insufficient documentation

## 2023-04-21 DIAGNOSIS — M1612 Unilateral primary osteoarthritis, left hip: Secondary | ICD-10-CM | POA: Insufficient documentation

## 2023-04-21 DIAGNOSIS — Z01818 Encounter for other preprocedural examination: Secondary | ICD-10-CM

## 2023-04-21 DIAGNOSIS — I10 Essential (primary) hypertension: Secondary | ICD-10-CM | POA: Diagnosis not present

## 2023-04-21 DIAGNOSIS — Z01812 Encounter for preprocedural laboratory examination: Secondary | ICD-10-CM | POA: Insufficient documentation

## 2023-04-21 DIAGNOSIS — Z7984 Long term (current) use of oral hypoglycemic drugs: Secondary | ICD-10-CM | POA: Insufficient documentation

## 2023-04-21 DIAGNOSIS — E119 Type 2 diabetes mellitus without complications: Secondary | ICD-10-CM | POA: Diagnosis not present

## 2023-04-21 HISTORY — DX: Headache, unspecified: R51.9

## 2023-04-21 LAB — GLUCOSE, CAPILLARY: Glucose-Capillary: 91 mg/dL (ref 70–99)

## 2023-04-21 LAB — CBC
HCT: 42.6 % (ref 36.0–46.0)
Hemoglobin: 13.3 g/dL (ref 12.0–15.0)
MCH: 28.7 pg (ref 26.0–34.0)
MCHC: 31.2 g/dL (ref 30.0–36.0)
MCV: 92 fL (ref 80.0–100.0)
Platelets: 323 10*3/uL (ref 150–400)
RBC: 4.63 MIL/uL (ref 3.87–5.11)
RDW: 13.9 % (ref 11.5–15.5)
WBC: 8.2 10*3/uL (ref 4.0–10.5)
nRBC: 0 % (ref 0.0–0.2)

## 2023-04-21 LAB — BASIC METABOLIC PANEL
Anion gap: 10 (ref 5–15)
BUN: 18 mg/dL (ref 8–23)
CO2: 21 mmol/L — ABNORMAL LOW (ref 22–32)
Calcium: 10.2 mg/dL (ref 8.9–10.3)
Chloride: 107 mmol/L (ref 98–111)
Creatinine, Ser: 0.8 mg/dL (ref 0.44–1.00)
GFR, Estimated: >60 mL/min (ref >60)
Glucose, Bld: 100 mg/dL — ABNORMAL HIGH (ref 70–99)
Potassium: 4.2 mmol/L (ref 3.5–5.1)
Sodium: 138 mmol/L (ref 135–145)

## 2023-04-21 LAB — SURGICAL PCR SCREEN
MRSA, PCR: NEGATIVE
Staphylococcus aureus: NEGATIVE

## 2023-04-22 NOTE — Progress Notes (Signed)
 Anesthesia Chart Review   Case: [id number] Date/Time: 04/28/23 1330   Procedure: TOTAL HIP ARTHROPLASTY ANTERIOR APPROACH (Left: Hip)   Anesthesia type: Choice   Pre-op diagnosis: left hip osteoarthritis   Location: [facility] ROOM 09 / [facility]   Surgeons: Ollen Gross, MD       DISCUSSION:77 y.o. 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 Ollen Gross.   Per cardiology preoperative evaluation 03/12/2023, "Chart reviewed as part of pre-operative protocol coverage. Patient was contacted 03/12/2023 in reference to pre-operative risk assessment for pending surgery as outlined below.  Jillian [name] was last seen on 02/11/2023 by Dr. Lennie Odor.  Since that day, Jillian [name] has done well from a cardiac standpoint without any new cardiac issues.   Therefore, based on ACC/AHA guidelines, the patient would be at acceptable risk for the planned procedure without further cardiovascular testing. "  VS: BP (!) 156/89   Pulse 92   Temp 36.8 C (Oral)   Resp 16   Ht 5\' 3"  (1.6 m)   Wt 84.4 kg   SpO2 100%   BMI 32.95 kg/m   PROVIDERS: Geoffry Paradise, MD is PCP   Cardiologist - Lennie Odor, MD  LABS: Labs reviewed: Acceptable for surgery. (all labs ordered are listed, but only abnormal results are displayed)  Labs Reviewed  BASIC METABOLIC PANEL - Abnormal; Notable for the following components:      Result Value   CO2 21 (*)    Glucose, Bld 100 (*)    All other components within normal limits  SURGICAL PCR SCREEN  CBC  GLUCOSE, CAPILLARY  TYPE AND SCREEN     IMAGES:   EKG:   CV: Cardiac Cath 10/30/2022   Prox LAD to Mid LAD lesion is 60% stenosed.   1st Diag lesion is 60% stenosed.   Prox RCA lesion is 30% stenosed.   The left ventricular systolic function is normal.   LV end diastolic pressure is mildly elevated.   The left ventricular ejection fraction is  55-65% by visual estimate.   1.  Moderate bifurcation stenosis involving proximal/mid LAD at the origin of first diagonal which also has moderate stenosis.  The LAD stenosis was interrogated by flow reserve evaluation and was not significant with an RFR of 0.94. 2.  Normal LV systolic function. 3.  Mildly elevated left ventricular end-diastolic pressure at 20 mmHg in the setting of sinus tachycardia and significantly elevated blood pressure.   Recommendations: Recommend aggressive medical therapy.  No indication for PCI.  Echocardiogram 01/14/2022:  Left ventricle cavity is normal in size and wall thickness. Normal global  wall motion. Normal LV systolic function with visual EF 60-65%. Doppler  evidence of grade I (impaired) diastolic dysfunction, normal LAP.  The aortic root is normal. Upper limit normal ascending aorta measuring  3.78 cm.  No significant valvular abnormality.  Normal right atrial pressure.  Past Medical History:  Diagnosis Date   Allergy    Anxiety    Bowel obstruction (HCC)    Cancer (HCC)    skin cancer had Mohs Surgery   Cataract    Coronary artery disease    Diabetes mellitus without complication (HCC)    Diverticulitis    GERD (gastroesophageal reflux disease)    Gout    H/O unstable angina    Headache    High cholesterol    History of kidney stones  Hypertension    Hypothyroidism    IBS (irritable bowel syndrome)    Neck pain    PONV (postoperative nausea and vomiting)    Renovascular hypertension    Spondylosis of lumbar spine     Past Surgical History:  Procedure Laterality Date   ABDOMINAL HYSTERECTOMY     APPENDECTOMY     BLADDER SURGERY     cryo-ablation   BREAST BIOPSY     unsure which breast   CARDIAC CATHETERIZATION     x 2   CHOLECYSTECTOMY     open   CORONARY PRESSURE/FFR STUDY N/A 10/30/2022   Procedure: CORONARY PRESSURE/FFR STUDY;  Surgeon: Iran Ouch, MD;  Location: [facility];  Service: Cardiovascular;   Laterality: N/A;   EXPLORATORY LAPAROTOMY     EYE SURGERY Bilateral    cataract removal   HERNIA REPAIR     x 2,   umbilcal and abdominal incisional with 12" mesh   LEFT HEART CATH AND CORONARY ANGIOGRAPHY N/A 10/30/2022   Procedure: LEFT HEART CATH AND CORONARY ANGIOGRAPHY;  Surgeon: Iran Ouch, MD;  Location: [facility] INVASIVE CV LAB;  Service: Cardiovascular;  Laterality: N/A;   RADIOFREQUENCY ABLATION NERVES  03/2020   on back, trial for possible spinal cord stimulator   TONSILLECTOMY     WRIST FRACTURE SURGERY Right    WRIST GANGLION EXCISION Right     MEDICATIONS:  Menthol-Zinc Oxide (CALMOSEPTINE) 0.44-20.6 % OINT   miconazole (MICOTIN) 2 % powder   Simethicone (GAS-X PO)   acetaminophen (TYLENOL) 500 MG tablet   aspirin EC 81 MG tablet   cyclobenzaprine (FLEXERIL) 10 MG tablet   EUTHYROX 137 MCG tablet   fexofenadine (ALLEGRA) 180 MG tablet   fluconazole (DIFLUCAN) 150 MG tablet   ibuprofen (ADVIL,MOTRIN) 200 MG tablet   magnesium hydroxide (MILK OF MAGNESIA) 400 MG/5ML suspension   metFORMIN (GLUCOPHAGE-XR) 500 MG 24 hr tablet   omeprazole (PRILOSEC OTC) 20 MG tablet   polyethylene glycol powder (GLYCOLAX/MIRALAX) 17 GM/SCOOP powder   Probiotic Product (ACIDOPHILUS, PROBIOTIC, PO)   rosuvastatin (CRESTOR) 20 MG tablet   telmisartan (MICARDIS) 80 MG tablet   Zinc Oxide (BALMEX EX)   No current facility-administered medications for this encounter.    [name], PA-C [facility] Testing [phone]

## 2023-04-28 ENCOUNTER — Observation Stay (HOSPITAL_COMMUNITY)
Admission: RE | Admit: 2023-04-28 | Discharge: 2023-05-02 | Disposition: A | Discharge status: Home / Self Care | Payer: Medicare Other | Source: Ambulatory Visit | Attending: Orthopedic Surgery | Admitting: Orthopedic Surgery

## 2023-04-28 ENCOUNTER — Encounter (HOSPITAL_COMMUNITY)
Admission: RE | Disposition: A | Discharge status: Home / Self Care | Payer: Self-pay | Source: Ambulatory Visit | Attending: Orthopedic Surgery

## 2023-04-28 ENCOUNTER — Encounter (HOSPITAL_COMMUNITY): Payer: Self-pay | Admitting: Orthopedic Surgery

## 2023-04-28 ENCOUNTER — Other Ambulatory Visit: Payer: Self-pay

## 2023-04-28 ENCOUNTER — Ambulatory Visit (HOSPITAL_COMMUNITY): Payer: Self-pay | Admitting: Anesthesiology

## 2023-04-28 ENCOUNTER — Observation Stay (HOSPITAL_COMMUNITY)

## 2023-04-28 ENCOUNTER — Ambulatory Visit (HOSPITAL_COMMUNITY): Payer: Self-pay | Admitting: Physician Assistant

## 2023-04-28 ENCOUNTER — Ambulatory Visit (HOSPITAL_COMMUNITY)

## 2023-04-28 DIAGNOSIS — Z96642 Presence of left artificial hip joint: Secondary | ICD-10-CM | POA: Diagnosis not present

## 2023-04-28 DIAGNOSIS — E119 Type 2 diabetes mellitus without complications: Secondary | ICD-10-CM | POA: Insufficient documentation

## 2023-04-28 DIAGNOSIS — M47816 Spondylosis without myelopathy or radiculopathy, lumbar region: Secondary | ICD-10-CM | POA: Diagnosis not present

## 2023-04-28 DIAGNOSIS — K5792 Diverticulitis of intestine, part unspecified, without perforation or abscess without bleeding: Secondary | ICD-10-CM | POA: Insufficient documentation

## 2023-04-28 DIAGNOSIS — Z91040 Latex allergy status: Secondary | ICD-10-CM | POA: Diagnosis not present

## 2023-04-28 DIAGNOSIS — M169 Osteoarthritis of hip, unspecified: Principal | ICD-10-CM | POA: Diagnosis present

## 2023-04-28 DIAGNOSIS — Z85828 Personal history of other malignant neoplasm of skin: Secondary | ICD-10-CM | POA: Insufficient documentation

## 2023-04-28 DIAGNOSIS — M1612 Unilateral primary osteoarthritis, left hip: Secondary | ICD-10-CM

## 2023-04-28 DIAGNOSIS — M542 Cervicalgia: Secondary | ICD-10-CM | POA: Diagnosis not present

## 2023-04-28 DIAGNOSIS — E7800 Pure hypercholesterolemia, unspecified: Secondary | ICD-10-CM | POA: Diagnosis not present

## 2023-04-28 DIAGNOSIS — Z79899 Other long term (current) drug therapy: Secondary | ICD-10-CM | POA: Diagnosis not present

## 2023-04-28 DIAGNOSIS — R21 Rash and other nonspecific skin eruption: Secondary | ICD-10-CM | POA: Insufficient documentation

## 2023-04-28 DIAGNOSIS — E039 Hypothyroidism, unspecified: Secondary | ICD-10-CM

## 2023-04-28 DIAGNOSIS — Z7984 Long term (current) use of oral hypoglycemic drugs: Secondary | ICD-10-CM | POA: Diagnosis not present

## 2023-04-28 DIAGNOSIS — K589 Irritable bowel syndrome without diarrhea: Secondary | ICD-10-CM | POA: Insufficient documentation

## 2023-04-28 DIAGNOSIS — I10 Essential (primary) hypertension: Secondary | ICD-10-CM | POA: Diagnosis not present

## 2023-04-28 DIAGNOSIS — Z87442 Personal history of urinary calculi: Secondary | ICD-10-CM | POA: Insufficient documentation

## 2023-04-28 DIAGNOSIS — M109 Gout, unspecified: Secondary | ICD-10-CM | POA: Diagnosis not present

## 2023-04-28 DIAGNOSIS — K219 Gastro-esophageal reflux disease without esophagitis: Secondary | ICD-10-CM | POA: Diagnosis not present

## 2023-04-28 DIAGNOSIS — Z7982 Long term (current) use of aspirin: Secondary | ICD-10-CM | POA: Diagnosis not present

## 2023-04-28 DIAGNOSIS — M129 Arthropathy, unspecified: Secondary | ICD-10-CM | POA: Diagnosis not present

## 2023-04-28 DIAGNOSIS — I2510 Atherosclerotic heart disease of native coronary artery without angina pectoris: Secondary | ICD-10-CM | POA: Diagnosis not present

## 2023-04-28 DIAGNOSIS — Z471 Aftercare following joint replacement surgery: Secondary | ICD-10-CM | POA: Diagnosis not present

## 2023-04-28 HISTORY — PX: TOTAL HIP ARTHROPLASTY: SHX124

## 2023-04-28 LAB — TYPE AND SCREEN
ABO/RH(D): A POS
Antibody Screen: NEGATIVE

## 2023-04-28 LAB — GLUCOSE, CAPILLARY
Glucose-Capillary: 82 mg/dL (ref 70–99)
Glucose-Capillary: 93 mg/dL (ref 70–99)

## 2023-04-28 SURGERY — ARTHROPLASTY, HIP, TOTAL, ANTERIOR APPROACH
Anesthesia: Spinal | Site: Hip | Laterality: Left

## 2023-04-28 MED ORDER — HYDROMORPHONE HCL 2 MG PO TABS
1.0000 mg | ORAL_TABLET | ORAL | Status: DC | PRN
  Administered 2023-05-01 – 2023-05-02 (×4): 2 mg via ORAL
  Filled 2023-04-28 (×4): qty 1

## 2023-04-28 MED ORDER — ORAL CARE MOUTH RINSE: 15.0000 mL | Freq: Once | OROMUCOSAL | Status: DC

## 2023-04-28 MED ORDER — BISACODYL 10 MG RE SUPP: 10.0000 mg | Freq: Every day | RECTAL | Status: DC | PRN

## 2023-04-28 MED ORDER — WATER FOR IRRIGATION, STERILE IR SOLN
Status: DC | PRN
  Administered 2023-04-28: 1000 mL

## 2023-04-28 MED ORDER — FENTANYL CITRATE PF 50 MCG/ML IJ SOSY: 50.0000 ug | PREFILLED_SYRINGE | INTRAMUSCULAR | Status: DC

## 2023-04-28 MED ORDER — PHENYLEPHRINE HCL-NACL 20-0.9 MG/250ML-% IV SOLN
INTRAVENOUS | Status: DC | PRN
  Administered 2023-04-28: 20 ug/min via INTRAVENOUS

## 2023-04-28 MED ORDER — DEXAMETHASONE SODIUM PHOSPHATE 10 MG/ML IJ SOLN
INTRAMUSCULAR | Status: AC
  Filled 2023-04-28: qty 4

## 2023-04-28 MED ORDER — CEFAZOLIN SODIUM-DEXTROSE 2-3 GM-%(50ML) IV SOLR
INTRAVENOUS | Status: DC | PRN
  Administered 2023-04-28: 2 g via INTRAVENOUS

## 2023-04-28 MED ORDER — HYDROMORPHONE HCL 1 MG/ML IJ SOLN
0.5000 mg | INTRAMUSCULAR | Status: DC | PRN
  Administered 2023-04-28 – 2023-04-29 (×2): 1 mg via INTRAVENOUS
  Administered 2023-04-29: 0.5 mg via INTRAVENOUS
  Administered 2023-04-29 – 2023-04-30 (×3): 1 mg via INTRAVENOUS
  Filled 2023-04-28 (×6): qty 1

## 2023-04-28 MED ORDER — PROPOFOL 10 MG/ML IV BOLUS
INTRAVENOUS | Status: AC
  Filled 2023-04-28: qty 20

## 2023-04-28 MED ORDER — MICONAZOLE NITRATE 2 % EX POWD: CUTANEOUS | Status: DC | PRN

## 2023-04-28 MED ORDER — MAGNESIUM CITRATE PO SOLN: 1.0000 | Freq: Once | ORAL | Status: DC | PRN

## 2023-04-28 MED ORDER — ACETAMINOPHEN 10 MG/ML IV SOLN
1000.0000 mg | Freq: Four times a day (QID) | INTRAVENOUS | Status: DC
  Administered 2023-04-28: 1000 mg via INTRAVENOUS
  Filled 2023-04-28: qty 100

## 2023-04-28 MED ORDER — PHENYLEPHRINE HCL (PRESSORS) 10 MG/ML IV SOLN
INTRAVENOUS | Status: DC | PRN
  Administered 2023-04-28: 160 ug via INTRAVENOUS

## 2023-04-28 MED ORDER — CYCLOBENZAPRINE HCL 10 MG PO TABS
10.0000 mg | ORAL_TABLET | Freq: Three times a day (TID) | ORAL | Status: DC | PRN
  Administered 2023-04-28 – 2023-05-02 (×10): 10 mg via ORAL
  Filled 2023-04-28 (×10): qty 1

## 2023-04-28 MED ORDER — MENTHOL 3 MG MT LOZG: 1.0000 | LOZENGE | OROMUCOSAL | Status: DC | PRN

## 2023-04-28 MED ORDER — OMEPRAZOLE 20 MG PO CPDR
20.0000 mg | DELAYED_RELEASE_CAPSULE | Freq: Every day | ORAL | Status: DC
  Administered 2023-04-29 – 2023-05-02 (×4): 20 mg via ORAL
  Filled 2023-04-28 (×4): qty 1

## 2023-04-28 MED ORDER — 0.9 % SODIUM CHLORIDE (POUR BTL) OPTIME
TOPICAL | Status: DC | PRN
  Administered 2023-04-28: 1000 mL

## 2023-04-28 MED ORDER — DEXMEDETOMIDINE HCL IN NACL 80 MCG/20ML IV SOLN
INTRAVENOUS | Status: DC | PRN
  Administered 2023-04-28: 8 ug via INTRAVENOUS

## 2023-04-28 MED ORDER — FENTANYL CITRATE PF 50 MCG/ML IJ SOSY
25.0000 ug | PREFILLED_SYRINGE | INTRAMUSCULAR | Status: DC | PRN
  Administered 2023-04-28 (×3): 50 ug via INTRAVENOUS

## 2023-04-28 MED ORDER — LORATADINE 10 MG PO TABS
10.0000 mg | ORAL_TABLET | Freq: Every day | ORAL | Status: DC
  Administered 2023-04-29 – 2023-04-30 (×2): 10 mg via ORAL
  Filled 2023-04-28 (×4): qty 1

## 2023-04-28 MED ORDER — LEVOTHYROXINE SODIUM 25 MCG PO TABS
137.0000 ug | ORAL_TABLET | Freq: Every day | ORAL | Status: DC
  Administered 2023-04-29 – 2023-05-02 (×4): 137 ug via ORAL
  Filled 2023-04-28 (×4): qty 1

## 2023-04-28 MED ORDER — PHENOL 1.4 % MT LIQD: 1.0000 | OROMUCOSAL | Status: DC | PRN

## 2023-04-28 MED ORDER — TRANEXAMIC ACID-NACL 1000-0.7 MG/100ML-% IV SOLN
1000.0000 mg | INTRAVENOUS | Status: AC
  Administered 2023-04-28: 1000 mg via INTRAVENOUS
  Filled 2023-04-28: qty 100

## 2023-04-28 MED ORDER — HYDROMORPHONE HCL 2 MG PO TABS
2.0000 mg | ORAL_TABLET | ORAL | Status: DC | PRN
  Administered 2023-04-29 – 2023-04-30 (×4): 3 mg via ORAL
  Administered 2023-04-30: 2 mg via ORAL
  Administered 2023-04-30 – 2023-05-01 (×3): 3 mg via ORAL
  Administered 2023-05-02 (×2): 2 mg via ORAL
  Filled 2023-04-28: qty 1
  Filled 2023-04-28: qty 2
  Filled 2023-04-28: qty 1
  Filled 2023-04-28: qty 2
  Filled 2023-04-28: qty 1
  Filled 2023-04-28 (×3): qty 2
  Filled 2023-04-28: qty 1
  Filled 2023-04-28 (×2): qty 2
  Filled 2023-04-28: qty 1

## 2023-04-28 MED ORDER — CHLORHEXIDINE GLUCONATE 0.12 % MT SOLN: 15.0000 mL | Freq: Once | OROMUCOSAL | Status: DC

## 2023-04-28 MED ORDER — ROSUVASTATIN CALCIUM 20 MG PO TABS
20.0000 mg | ORAL_TABLET | Freq: Every day | ORAL | Status: DC
  Administered 2023-04-29 – 2023-05-02 (×4): 20 mg via ORAL
  Filled 2023-04-28 (×4): qty 1

## 2023-04-28 MED ORDER — KETAMINE HCL 50 MG/5ML IJ SOSY
PREFILLED_SYRINGE | INTRAMUSCULAR | Status: AC
  Filled 2023-04-28: qty 5

## 2023-04-28 MED ORDER — INSULIN ASPART 100 UNIT/ML IJ SOLN: 0.0000 [IU] | INTRAMUSCULAR | Status: DC | PRN

## 2023-04-28 MED ORDER — DEXAMETHASONE SODIUM PHOSPHATE 10 MG/ML IJ SOLN: 8.0000 mg | Freq: Once | INTRAMUSCULAR | Status: DC

## 2023-04-28 MED ORDER — OXYCODONE HCL 5 MG PO TABS: 5.0000 mg | ORAL_TABLET | Freq: Once | ORAL | Status: DC | PRN

## 2023-04-28 MED ORDER — DEXAMETHASONE SODIUM PHOSPHATE 10 MG/ML IJ SOLN
10.0000 mg | Freq: Once | INTRAMUSCULAR | Status: AC
  Administered 2023-04-29: 10 mg via INTRAVENOUS
  Filled 2023-04-28: qty 1

## 2023-04-28 MED ORDER — FENTANYL CITRATE PF 50 MCG/ML IJ SOSY
PREFILLED_SYRINGE | INTRAMUSCULAR | Status: AC
Start: 2023-04-28 — End: 2023-04-29
  Filled 2023-04-28: qty 1

## 2023-04-28 MED ORDER — AMISULPRIDE (ANTIEMETIC) 5 MG/2ML IV SOLN
INTRAVENOUS | Status: AC
  Filled 2023-04-28: qty 2

## 2023-04-28 MED ORDER — BUPIVACAINE-EPINEPHRINE (PF) 0.25% -1:200000 IJ SOLN
INTRAMUSCULAR | Status: DC | PRN
  Administered 2023-04-28: 30 mL

## 2023-04-28 MED ORDER — FENTANYL CITRATE (PF) 100 MCG/2ML IJ SOLN
INTRAMUSCULAR | Status: DC | PRN
  Administered 2023-04-28: 25 ug via INTRAVENOUS
  Administered 2023-04-28: 50 ug via INTRAVENOUS
  Administered 2023-04-28: 25 ug via INTRAVENOUS

## 2023-04-28 MED ORDER — METOCLOPRAMIDE HCL 5 MG/ML IJ SOLN: 5.0000 mg | Freq: Three times a day (TID) | INTRAMUSCULAR | Status: DC | PRN

## 2023-04-28 MED ORDER — ONDANSETRON HCL 4 MG/2ML IJ SOLN
4.0000 mg | Freq: Four times a day (QID) | INTRAMUSCULAR | Status: DC | PRN
  Administered 2023-04-28: 4 mg via INTRAVENOUS
  Filled 2023-04-28: qty 2

## 2023-04-28 MED ORDER — CEFAZOLIN SODIUM-DEXTROSE 2-4 GM/100ML-% IV SOLN
2.0000 g | Freq: Four times a day (QID) | INTRAVENOUS | Status: AC
  Administered 2023-04-28 – 2023-04-29 (×2): 2 g via INTRAVENOUS
  Filled 2023-04-28 (×2): qty 100

## 2023-04-28 MED ORDER — OXYCODONE HCL 5 MG/5ML PO SOLN: 5.0000 mg | Freq: Once | ORAL | Status: DC | PRN

## 2023-04-28 MED ORDER — HYDROMORPHONE HCL 1 MG/ML IJ SOLN
INTRAMUSCULAR | Status: AC
  Administered 2023-04-28: 0.5 mg
  Filled 2023-04-28: qty 2

## 2023-04-28 MED ORDER — FENTANYL CITRATE (PF) 100 MCG/2ML IJ SOLN
INTRAMUSCULAR | Status: AC
  Filled 2023-04-28: qty 2

## 2023-04-28 MED ORDER — PROPOFOL 10 MG/ML IV BOLUS
INTRAVENOUS | Status: DC | PRN
  Administered 2023-04-28 (×3): 20 mg via INTRAVENOUS
  Administered 2023-04-28: 80 ug/kg/min via INTRAVENOUS
  Administered 2023-04-28 (×2): 20 mg via INTRAVENOUS

## 2023-04-28 MED ORDER — DEXMEDETOMIDINE HCL IN NACL 80 MCG/20ML IV SOLN
INTRAVENOUS | Status: AC
  Filled 2023-04-28: qty 20

## 2023-04-28 MED ORDER — POLYETHYLENE GLYCOL 3350 17 G PO PACK: 34.0000 g | PACK | Freq: Every day | ORAL | Status: DC | PRN

## 2023-04-28 MED ORDER — IRBESARTAN 150 MG PO TABS
300.0000 mg | ORAL_TABLET | Freq: Every day | ORAL | Status: DC
  Administered 2023-04-29 – 2023-05-02 (×3): 300 mg via ORAL
  Filled 2023-04-28 (×4): qty 2

## 2023-04-28 MED ORDER — BUPIVACAINE IN DEXTROSE 0.75-8.25 % IT SOLN
INTRATHECAL | Status: DC | PRN
  Administered 2023-04-28: 1.8 mL via INTRATHECAL

## 2023-04-28 MED ORDER — PHENYLEPHRINE 80 MCG/ML (10ML) SYRINGE FOR IV PUSH (FOR BLOOD PRESSURE SUPPORT)
PREFILLED_SYRINGE | INTRAVENOUS | Status: AC
  Filled 2023-04-28: qty 10

## 2023-04-28 MED ORDER — ACETAMINOPHEN 325 MG PO TABS
325.0000 mg | ORAL_TABLET | Freq: Four times a day (QID) | ORAL | Status: DC | PRN
  Administered 2023-04-29 – 2023-05-02 (×9): 650 mg via ORAL
  Filled 2023-04-28 (×10): qty 2

## 2023-04-28 MED ORDER — FENTANYL CITRATE PF 50 MCG/ML IJ SOSY
PREFILLED_SYRINGE | INTRAMUSCULAR | Status: AC
  Filled 2023-04-28: qty 2

## 2023-04-28 MED ORDER — KETAMINE HCL 10 MG/ML IJ SOLN
INTRAMUSCULAR | Status: DC | PRN
  Administered 2023-04-28: 20 mg via INTRAVENOUS

## 2023-04-28 MED ORDER — ONDANSETRON HCL 4 MG PO TABS: 4.0000 mg | ORAL_TABLET | Freq: Four times a day (QID) | ORAL | Status: DC | PRN

## 2023-04-28 MED ORDER — BUPIVACAINE-EPINEPHRINE (PF) 0.25% -1:200000 IJ SOLN
INTRAMUSCULAR | Status: AC
  Filled 2023-04-28: qty 30

## 2023-04-28 MED ORDER — SODIUM CHLORIDE 0.9 % IV SOLN: INTRAVENOUS | Status: DC

## 2023-04-28 MED ORDER — ASPIRIN 81 MG PO CHEW
81.0000 mg | CHEWABLE_TABLET | Freq: Two times a day (BID) | ORAL | Status: DC
  Administered 2023-04-28 – 2023-05-02 (×8): 81 mg via ORAL
  Filled 2023-04-28 (×8): qty 1

## 2023-04-28 MED ORDER — METOCLOPRAMIDE HCL 5 MG PO TABS: 5.0000 mg | ORAL_TABLET | Freq: Three times a day (TID) | ORAL | Status: DC | PRN

## 2023-04-28 MED ORDER — DOCUSATE SODIUM 100 MG PO CAPS
100.0000 mg | ORAL_CAPSULE | Freq: Two times a day (BID) | ORAL | Status: DC
  Administered 2023-04-28 – 2023-05-02 (×8): 100 mg via ORAL
  Filled 2023-04-28 (×8): qty 1

## 2023-04-28 MED ORDER — LACTATED RINGERS IV SOLN: INTRAVENOUS | Status: DC

## 2023-04-28 MED ORDER — POVIDONE-IODINE 10 % EX SWAB: 2.0000 | Freq: Once | CUTANEOUS | Status: DC

## 2023-04-28 MED ORDER — AMISULPRIDE (ANTIEMETIC) 5 MG/2ML IV SOLN
10.0000 mg | Freq: Once | INTRAVENOUS | Status: AC | PRN
  Administered 2023-04-28: 10 mg via INTRAVENOUS

## 2023-04-28 MED ORDER — CEFAZOLIN SODIUM-DEXTROSE 2-4 GM/100ML-% IV SOLN
2.0000 g | INTRAVENOUS | Status: DC
  Filled 2023-04-28: qty 100

## 2023-04-28 SURGICAL SUPPLY — 40 items
BAG COUNTER SPONGE SURGICOUNT (BAG) IMPLANT
BAG ZIPLOCK 12X15 (MISCELLANEOUS) IMPLANT
BLADE SAG 18X100X1.27 (BLADE) ×2 IMPLANT
COVER PERINEAL POST (MISCELLANEOUS) ×2 IMPLANT
COVER SURGICAL LIGHT HANDLE (MISCELLANEOUS) ×2 IMPLANT
CUP ACET PINNACLE SECTR 50MM (Hips) IMPLANT
DERMABOND ADVANCED .7 DNX12 (GAUZE/BANDAGES/DRESSINGS) ×2 IMPLANT
DRAPE FOOT SWITCH (DRAPES) ×2 IMPLANT
DRAPE STERI IOBAN 125X83 (DRAPES) ×2 IMPLANT
DRAPE U-SHAPE 47X51 STRL (DRAPES) ×4 IMPLANT
DRSG AQUACEL AG ADV 3.5X10 (GAUZE/BANDAGES/DRESSINGS) ×2 IMPLANT
DRSG EMULSION OIL 3X3 NADH (GAUZE/BANDAGES/DRESSINGS) IMPLANT
DRSG TEGADERM 4X4.75 (GAUZE/BANDAGES/DRESSINGS) IMPLANT
DURAPREP 26ML APPLICATOR (WOUND CARE) ×2 IMPLANT
ELECT REM PT RETURN 15FT ADLT (MISCELLANEOUS) ×2 IMPLANT
GAUZE SPONGE 2X2 8PLY STRL LF (GAUZE/BANDAGES/DRESSINGS) IMPLANT
GLOVE BIO SURGEON STRL SZ 6.5 (GLOVE) IMPLANT
GLOVE BIO SURGEON STRL SZ7 (GLOVE) IMPLANT
GLOVE BIO SURGEON STRL SZ8 (GLOVE) ×2 IMPLANT
GLOVE BIOGEL PI IND STRL 7.0 (GLOVE) IMPLANT
GLOVE BIOGEL PI IND STRL 8 (GLOVE) ×2 IMPLANT
GOWN STRL REUS W/ TWL LRG LVL3 (GOWN DISPOSABLE) ×4 IMPLANT
HEAD FEM STD 32X+1 STRL (Hips) IMPLANT
HOLDER FOLEY CATH W/STRAP (MISCELLANEOUS) ×2 IMPLANT
KIT TURNOVER KIT A (KITS) IMPLANT
LINER MARATHON 32 50 (Hips) IMPLANT
MANIFOLD NEPTUNE II (INSTRUMENTS) ×2 IMPLANT
PACK ANTERIOR HIP CUSTOM (KITS) ×2 IMPLANT
PENCIL SMOKE EVACUATOR COATED (MISCELLANEOUS) ×2 IMPLANT
PINNACLE SECTOR CUP 50MM (Hips) ×1 IMPLANT
SPIKE FLUID TRANSFER (MISCELLANEOUS) ×2 IMPLANT
STEM FEMORAL SZ 6MM STD ACTIS (Stem) IMPLANT
SUT ETHIBOND NAB CT1 #1 30IN (SUTURE) ×2 IMPLANT
SUT MNCRL AB 4-0 PS2 18 (SUTURE) ×2 IMPLANT
SUT STRATAFIX 0 PDS 27 VIOLET (SUTURE) ×1 IMPLANT
SUT VIC AB 2-0 CT1 TAPERPNT 27 (SUTURE) ×4 IMPLANT
SUTURE STRATFX 0 PDS 27 VIOLET (SUTURE) ×2 IMPLANT
TOWEL GREEN STERILE FF (TOWEL DISPOSABLE) ×2 IMPLANT
TRAY FOLEY MTR SLVR 16FR STAT (SET/KITS/TRAYS/PACK) ×2 IMPLANT
TUBE SUCTION HIGH CAP CLEAR NV (SUCTIONS) ×2 IMPLANT

## 2023-04-28 NOTE — Transfer of Care (Signed)
 Immediate Anesthesia Transfer of Care Note  Patient: Jillian [name]  Procedure(s) Performed: LEFT TOTAL HIP ARTHROPLASTY ANTERIOR APPROACH (Left: Hip)  Patient Location: PACU  Anesthesia Type:MAC and Spinal  Level of Consciousness: awake, alert , and oriented  Airway & Oxygen Therapy: Patient Spontanous Breathing and Patient connected to face mask oxygen  Post-op Assessment: Report given to RN and Post -op Vital signs reviewed and stable  Post vital signs: Reviewed and stable  Last Vitals:  Vitals Value Taken Time  BP 137/73 04/28/23 1542  Temp    Pulse 85   Resp 13 04/28/23 1544  SpO2 100   Vitals shown include unfiled device data.  Last Pain:  Vitals:   04/28/23 1208  TempSrc:   PainSc: 7          Complications: No notable events documented.

## 2023-04-28 NOTE — Interval H&P Note (Signed)
 History and Physical Interval Note:  04/28/2023 11:38 AM  Jillian [name]  has presented today for surgery, with the diagnosis of left hip osteoarthritis.  The various methods of treatment have been discussed with the patient and family. After consideration of risks, benefits and other options for treatment, the patient has consented to  Procedure(s): TOTAL HIP ARTHROPLASTY ANTERIOR APPROACH (Left) as a surgical intervention.  The patient's history has been reviewed, patient examined, no change in status, stable for surgery.  I have reviewed the patient's chart and labs.  Questions were answered to the patient's satisfaction.     Homero Fellers Monae Topping

## 2023-04-28 NOTE — Discharge Instructions (Addendum)
 Ollen Gross, MD Total Joint Specialist [facility] [address], [address] [place], [place] [address] [phone]  ANTERIOR APPROACH TOTAL HIP REPLACEMENT POSTOPERATIVE DIRECTIONS     Hip Rehabilitation, Guidelines Following Surgery  The results of a hip operation are greatly improved after range of motion and muscle strengthening exercises. Follow all safety measures which are given to protect your hip. If any of these exercises cause increased pain or swelling in your joint, decrease the amount until you are comfortable again. Then slowly increase the exercises. Call your caregiver if you have problems or questions.   BLOOD CLOT PREVENTION Take 81 mg Aspirin two times a day for three weeks following surgery. Then resume your regular dose of 81 mg Aspirin once a day. You may resume your vitamins/supplements upon discharge from the hospital. Do not take any NSAIDs (Advil, Aleve, Ibuprofen, Meloxicam, etc.) for 3 weeks, while taking 81mg  Aspirin twice a day.   HOME CARE INSTRUCTIONS  Remove items at home which could result in a fall. This includes throw rugs or furniture in walking pathways.  ICE to the affected hip as frequently as 20-30 minutes an hour and then as needed for pain and swelling. Continue to use ice on the hip for pain and swelling from surgery. You may notice swelling that will progress down to the foot and ankle. This is normal after surgery. Elevate the leg when you are not up walking on it.   Continue to use the breathing machine which will help keep your temperature down.  It is common for your temperature to cycle up and down following surgery, especially at night when you are not up moving around and exerting yourself.  The breathing machine keeps your lungs expanded and your temperature down.  DIET You may resume your previous home diet once your are discharged from the hospital.  DRESSING / WOUND CARE / SHOWERING You have an adhesive  waterproof bandage over the incision. Leave this in place until your first follow-up appointment. Once you remove this you will not need to place another bandage.  You may begin showering 3 days following surgery, but do not submerge the incision under water.  ACTIVITY For the first 3-5 days, it is important to rest and keep the operative leg elevated. You should, as a general rule, rest for 50 minutes and walk/stretch for 10 minutes per hour. After 5 days, you may slowly increase activity as tolerated.  Perform the exercises you were provided twice a day for about 15-20 minutes each session. Begin these 2 days following surgery. Walk with your walker as instructed. Use the walker until you are comfortable transitioning to a cane. Walk with the cane in the opposite hand of the operative leg. You may discontinue the cane once you are comfortable and walking steadily. Avoid periods of inactivity such as sitting longer than an hour when not asleep. This helps prevent blood clots.  Do not drive a car for 6 weeks or until released by your surgeon.  Do not drive while taking narcotics.  TED HOSE STOCKINGS Wear the elastic stockings on both legs for three weeks following surgery during the day. You may remove them at night while sleeping.  WEIGHT BEARING Weight bearing as tolerated with assist device (walker, cane, etc) as directed, use it as long as suggested by your surgeon or therapist, typically at least 4-6 weeks.  POSTOPERATIVE CONSTIPATION PROTOCOL Constipation - defined medically as fewer than three stools per week and severe constipation as less  than one stool per week.  One of the most common issues patients have following surgery is constipation.  Even if you have a regular bowel pattern at home, your normal regimen is likely to be disrupted due to multiple reasons following surgery.  Combination of anesthesia, postoperative narcotics, change in appetite and fluid intake all can affect your  bowels.  In order to avoid complications following surgery, here are some recommendations in order to help you during your recovery period.  Colace (docusate) - Pick up an over-the-counter form of Colace or another stool softener and take twice a day as long as you are requiring postoperative pain medications.  Take with a full glass of water daily.  If you experience loose stools or diarrhea, hold the colace until you stool forms back up.  If your symptoms do not get better within 1 week or if they get worse, check with your doctor. Dulcolax (bisacodyl) - Pick up over-the-counter and take as directed by the product packaging as needed to assist with the movement of your bowels.  Take with a full glass of water.  Use this product as needed if not relieved by Colace only.  MiraLax (polyethylene glycol) - Pick up over-the-counter to have on hand.  MiraLax is a solution that will increase the amount of water in your bowels to assist with bowel movements.  Take as directed and can mix with a glass of water, juice, soda, coffee, or tea.  Take if you go more than two days without a movement.Do not use MiraLax more than once per day. Call your doctor if you are still constipated or irregular after using this medication for 7 days in a row.  If you continue to have problems with postoperative constipation, please contact the office for further assistance and recommendations.  If you experience "the worst abdominal pain ever" or develop nausea or vomiting, please contact the office immediatly for further recommendations for treatment.  ITCHING  If you experience itching with your medications, try taking only a single pain pill, or even half a pain pill at a time.  You can also use Benadryl over the counter for itching or also to help with sleep.   MEDICATIONS See your medication summary on the "After Visit Summary" that the nursing staff will review with you prior to discharge.  You may have some home medications  which will be placed on hold until you complete the course of blood thinner medication.  It is important for you to complete the blood thinner medication as prescribed by your surgeon.  Continue your approved medications as instructed at time of discharge.  PRECAUTIONS If you experience chest pain or shortness of breath - call 911 immediately for transfer to the hospital emergency department.  If you develop a fever greater that 101 F, purulent drainage from wound, increased redness or drainage from wound, foul odor from the wound/dressing, or calf pain - CONTACT YOUR SURGEON.                                                   FOLLOW-UP APPOINTMENTS Make sure you keep all of your appointments after your operation with your surgeon and caregivers. You should call the office at the above phone number and make an appointment for approximately two weeks after the date of your surgery or on the date  instructed by your surgeon outlined in the "After Visit Summary".  RANGE OF MOTION AND STRENGTHENING EXERCISES  These exercises are designed to help you keep full movement of your hip joint. Follow your caregiver's or physical therapist's instructions. Perform all exercises about fifteen times, three times per day or as directed. Exercise both hips, even if you have had only one joint replacement. These exercises can be done on a training (exercise) mat, on the floor, on a table or on a bed. Use whatever works the best and is most comfortable for you. Use music or television while you are exercising so that the exercises are a pleasant break in your day. This will make your life better with the exercises acting as a break in routine you can look forward to.  Lying on your back, slowly slide your foot toward your buttocks, raising your knee up off the floor. Then slowly slide your foot back down until your leg is straight again.  Lying on your back spread your legs as far apart as you can without causing discomfort.   Lying on your side, raise your upper leg and foot straight up from the floor as far as is comfortable. Slowly lower the leg and repeat.  Lying on your back, tighten up the muscle in the front of your thigh (quadriceps muscles). You can do this by keeping your leg straight and trying to raise your heel off the floor. This helps strengthen the largest muscle supporting your knee.  Lying on your back, tighten up the muscles of your buttocks both with the legs straight and with the knee bent at a comfortable angle while keeping your heel on the floor.   POST-OPERATIVE OPIOID TAPER INSTRUCTIONS: It is important to wean off of your opioid medication as soon as possible. If you do not need pain medication after your surgery it is ok to stop day one. Opioids include: Codeine, Hydrocodone(Norco, Vicodin), Oxycodone(Percocet, oxycontin) and hydromorphone amongst others.  Long term and even short term use of opiods can cause: Increased pain response Dependence Constipation Depression Respiratory depression And more.  Withdrawal symptoms can include Flu like symptoms Nausea, vomiting And more Techniques to manage these symptoms Hydrate well Eat regular healthy meals Stay active Use relaxation techniques(deep breathing, meditating, yoga) Do Not substitute Alcohol to help with tapering If you have been on opioids for less than two weeks and do not have pain than it is ok to stop all together.  Plan to wean off of opioids This plan should start within one week post op of your joint replacement. Maintain the same interval or time between taking each dose and first decrease the dose.  Cut the total daily intake of opioids by one tablet each day Next start to increase the time between doses. The last dose that should be eliminated is the evening dose.   IF YOU ARE TRANSFERRED TO A SKILLED REHAB FACILITY If the patient is transferred to a skilled rehab facility following release from the hospital, a  list of the current medications will be sent to the facility for the patient to continue.  When discharged from the skilled rehab facility, please have the facility set up the patient's Home Health Physical Therapy prior to being released. Also, the skilled facility will be responsible for providing the patient with their medications at time of release from the facility to include their pain medication, the muscle relaxants, and their blood thinner medication. If the patient is still at the rehab facility at  time of the two week follow up appointment, the skilled rehab facility will also need to assist the patient in arranging follow up appointment in our office and any transportation needs.  MAKE SURE YOU:  Understand these instructions.  Get help right away if you are not doing well or get worse.    DENTAL ANTIBIOTICS:  In most cases prophylactic antibiotics for Dental procdeures after total joint surgery are not necessary.  Exceptions are as follows:  1. History of prior total joint infection  2. Severely immunocompromised (Organ Transplant, cancer chemotherapy, Rheumatoid biologic meds such as Humera)  3. Poorly controlled diabetes (A1C &gt; 8.0, blood glucose over 200)  If you have one of these conditions, contact your surgeon for an antibiotic prescription, prior to your dental procedure.    Pick up stool softner and laxative for home use following surgery while on pain medications. Do not submerge incision under water. Please use good hand washing techniques while changing dressing each day. May shower starting three days after surgery. Please use a clean towel to pat the incision dry following showers. Continue to use ice for pain and swelling after surgery. Do not use any lotions or creams on the incision until instructed by your surgeon.

## 2023-04-28 NOTE — Anesthesia Procedure Notes (Signed)
 Spinal  Patient location during procedure: OR Start time: 04/28/2023 1:46 PM End time: 04/28/2023 1:48 PM Reason for block: surgical anesthesia Staffing Performed: anesthesiologist  Anesthesiologist: Elmer Picker, MD Performed by: Elmer Picker, MD Authorized by: Elmer Picker, MD   Preanesthetic Checklist Completed: patient identified, IV checked, risks and benefits discussed, surgical consent, monitors and equipment checked, pre-op evaluation and timeout performed Spinal Block Patient position: sitting Prep: DuraPrep and site prepped and draped Patient monitoring: cardiac monitor, continuous pulse ox and blood pressure Approach: midline Location: L3-4 Injection technique: single-shot Needle Needle type: Pencan  Needle gauge: 24 G Needle length: 9 cm Assessment Sensory level: T6 Events: CSF return Additional Notes Functioning IV was confirmed and monitors were applied. Sterile prep and drape, including hand hygiene and sterile gloves were used. The patient was positioned and the spine was prepped. The skin was anesthetized with lidocaine.  Free flow of clear CSF was obtained prior to injecting local anesthetic into the CSF.  The spinal needle aspirated freely following injection.  The needle was carefully withdrawn.  The patient tolerated the procedure well.

## 2023-04-28 NOTE — Anesthesia Postprocedure Evaluation (Signed)
 Anesthesia Post Note  Patient: Jillian [name]  Procedure(s) Performed: LEFT TOTAL HIP ARTHROPLASTY ANTERIOR APPROACH (Left: Hip)     Patient location during evaluation: PACU Anesthesia Type: Spinal Level of consciousness: oriented and awake and alert Pain management: pain level controlled Vital Signs Assessment: post-procedure vital signs reviewed and stable Respiratory status: spontaneous breathing, respiratory function stable and patient connected to nasal cannula oxygen Cardiovascular status: blood pressure returned to baseline and stable Postop Assessment: no headache, no backache and no apparent nausea or vomiting Anesthetic complications: no  No notable events documented.  Last Vitals:  Vitals:   04/28/23 1715 04/28/23 1730  BP: (!) 160/78 (!) 162/81  Pulse: 89 87  Resp: 12 14  Temp:    SpO2: 96% 95%    Last Pain:  Vitals:   04/28/23 1730  TempSrc:   PainSc: 3                  Dereon Williamsen L Geneva Barrero

## 2023-04-28 NOTE — Anesthesia Preprocedure Evaluation (Addendum)
 Anesthesia Evaluation  Patient identified by MRN, date of birth, ID band Patient awake    Reviewed: Allergy & Precautions, NPO status , Patient's Chart, lab work & pertinent test results  History of Anesthesia Complications (+) PONV and history of anesthetic complications  Airway Mallampati: III  TM Distance: >3 FB Neck ROM: Full    Dental  (+) Dental Advisory Given, Loose,    Pulmonary neg pulmonary ROS   Pulmonary exam normal breath sounds clear to auscultation       Cardiovascular hypertension, Pt. on medications + angina  + CAD  Normal cardiovascular exam Rhythm:Regular Rate:Normal  Cardiac Cath 10/30/2022   Prox LAD to Mid LAD lesion is 60% stenosed.   1st Diag lesion is 60% stenosed.   Prox RCA lesion is 30% stenosed.   The left ventricular systolic function is normal.   LV end diastolic pressure is mildly elevated.   The left ventricular ejection fraction is 55-65% by visual estimate.   1.  Moderate bifurcation stenosis involving proximal/mid LAD at the origin of first diagonal which also has moderate stenosis.  The LAD stenosis was interrogated by flow reserve evaluation and was not significant with an RFR of 0.94. 2.  Normal LV systolic function. 3.  Mildly elevated left ventricular end-diastolic pressure at 20 mmHg in the setting of sinus tachycardia and significantly elevated blood pressure.   Recommendations: Recommend aggressive medical therapy.  No indication for PCI.   Echocardiogram 01/14/2022:  Left ventricle cavity is normal in size and wall thickness. Normal global  wall motion. Normal LV systolic function with visual EF 60-65%. Doppler  evidence of grade I (impaired) diastolic dysfunction, normal LAP.  The aortic root is normal. Upper limit normal ascending aorta measuring  3.78 cm.  No significant valvular abnormality.  Normal right atrial pressure.    Neuro/Psych  Headaches PSYCHIATRIC  DISORDERS Anxiety        GI/Hepatic Neg liver ROS,GERD  ,,  Endo/Other  diabetes, Type 2, Oral Hypoglycemic AgentsHypothyroidism    Renal/GU negative Renal ROS  negative genitourinary   Musculoskeletal  (+) Arthritis ,    Abdominal   Peds  Hematology negative hematology ROS (+)   Anesthesia Other Findings   Reproductive/Obstetrics                             Anesthesia Physical Anesthesia Plan  ASA: 3  Anesthesia Plan: Spinal   Post-op Pain Management: Ofirmev IV (intra-op)*   Induction:   PONV Risk Score and Plan: 3 and Treatment may vary due to age or medical condition, Propofol infusion, Dexamethasone and Ondansetron  Airway Management Planned: Natural Airway  Additional Equipment:   Intra-op Plan:   Post-operative Plan:   Informed Consent: I have reviewed the patients History and Physical, chart, labs and discussed the procedure including the risks, benefits and alternatives for the proposed anesthesia with the patient or authorized representative who has indicated his/her understanding and acceptance.     Dental advisory given  Plan Discussed with: CRNA  Anesthesia Plan Comments:        Anesthesia Quick Evaluation

## 2023-04-28 NOTE — Op Note (Signed)
 OPERATIVE REPORT- TOTAL HIP ARTHROPLASTY   PREOPERATIVE DIAGNOSIS: Osteoarthritis of the Left hip.   POSTOPERATIVE DIAGNOSIS: Osteoarthritis of the Left  hip.   PROCEDURE: Left total hip arthroplasty, anterior approach.   SURGEON: Ollen Gross, MD   ASSISTANT: Arcola Jansky, PA-C  ANESTHESIA:  Spinal  ESTIMATED BLOOD LOSS:-300 mL    DRAINS: None  COMPLICATIONS: None   CONDITION: PACU - hemodynamically stable.   BRIEF CLINICAL NOTE: Jillian [name] is a 78 y.o. female who has advanced end-  stage arthritis of their Left  hip with progressively worsening pain and  dysfunction.The patient has failed nonoperative management and presents for  total hip arthroplasty.   PROCEDURE IN DETAIL: After successful administration of spinal  anesthetic, the traction boots for the [facility] bed were placed on both  feet and the patient was placed onto the [facility] bed, boots placed into the leg  holders. The Left hip was then isolated from the perineum with plastic  drapes and prepped and draped in the usual sterile fashion. ASIS and  greater trochanter were marked and a oblique incision was made, starting  at about 1 cm lateral and 2 cm distal to the ASIS and coursing towards  the anterior cortex of the femur. The skin was cut with a 10 blade  through subcutaneous tissue to the level of the fascia overlying the  tensor fascia lata muscle. The fascia was then incised in line with the  incision at the junction of the anterior third and posterior 2/3rd. The  muscle was teased off the fascia and then the interval between the TFL  and the rectus was developed. The Hohmann retractor was then placed at  the top of the femoral neck over the capsule. The vessels overlying the  capsule were cauterized and the fat on top of the capsule was removed.  A Hohmann retractor was then placed anterior underneath the rectus  femoris to give exposure to the entire anterior capsule. A T-shaped   capsulotomy was performed. The edges were tagged and the femoral head  was identified.       Osteophytes are removed off the superior acetabulum.  The femoral neck was then cut in situ with an oscillating saw. Traction  was then applied to the left lower extremity utilizing the [facility]  traction. The femoral head was then removed. Retractors were placed  around the acetabulum and then circumferential removal of the labrum was  performed. Osteophytes were also removed. Reaming starts at 45 mm to  medialize and  Increased in 2 mm increments to 49 mm. We reamed in  approximately 40 degrees of abduction, 20 degrees anteversion. A 50 mm  pinnacle acetabular shell was then impacted in anatomic position under  fluoroscopic guidance with excellent purchase. We did not need to place  any additional dome screws. A 32 mm neutral + 4 marathon liner was then  placed into the acetabular shell.       The femoral lift was then placed along the lateral aspect of the femur  just distal to the vastus ridge. The leg was  externally rotated and capsule  was stripped off the inferior aspect of the femoral neck down to the  level of the lesser trochanter, this was done with electrocautery. The femur was lifted after this was performed. The  leg was then placed in an extended and adducted position essentially delivering the femur. We also removed the capsule superiorly and the piriformis from the piriformis fossa to  gain excellent exposure of the  proximal femur. Rongeur was used to remove some cancellous bone to get  into the lateral portion of the proximal femur for placement of the  initial starter reamer. The starter broaches was placed  the starter broach  and was shown to go down the center of the canal. Broaching  with the Actis system was then performed starting at size 0  coursing  Up to size 6. A size 6 had excellent torsional and rotational  and axial stability. The trial standard offset neck was then  placed  with a 32 + 1 trial head. The hip was then reduced. We confirmed that  the stem was in the canal both on AP and lateral x-rays. It also has excellent sizing. The hip was reduced with outstanding stability through full extension and full external rotation.. AP pelvis was taken and the leg lengths were measured and found to be equal. Hip was then dislocated again and the femoral head and neck removed. The  femoral broach was removed. Size 6 Actis stem with a standard offset  neck was then impacted into the femur following native anteversion. Has  excellent purchase in the canal. Excellent torsional and rotational and  axial stability. It is confirmed to be in the canal on AP and lateral  fluoroscopic views. The 32 + 1 metal head was placed and the hip  reduced with outstanding stability. Again AP pelvis was taken and it  confirmed that the leg lengths were equal. The wound was then copiously  irrigated with saline solution and the capsule reattached and repaired  with Ethibond suture. 30 ml of .25% Bupivicaine was  injected into the capsule and into the edge of the tensor fascia lata as well as subcutaneous tissue. The fascia overlying the tensor fascia lata was then closed with a running #1 V-Loc. Subcu was closed with interrupted 2-0 Vicryl and subcuticular running 4-0 Monocryl. Incision was cleaned  and dried. Steri-Strips and a bulky sterile dressing applied. The patient was awakened and transported to  recovery in stable condition.        Please note that a surgical assistant was a medical necessity for this procedure to perform it in a safe and expeditious manner. Assistant was necessary to provide appropriate retraction of vital neurovascular structures and to prevent femoral fracture and allow for anatomic placement of the prosthesis.  Ollen Gross, M.D.

## 2023-04-29 ENCOUNTER — Encounter (HOSPITAL_COMMUNITY): Payer: Self-pay | Admitting: Orthopedic Surgery

## 2023-04-29 ENCOUNTER — Other Ambulatory Visit: Payer: Self-pay

## 2023-04-29 ENCOUNTER — Other Ambulatory Visit (HOSPITAL_COMMUNITY): Payer: Self-pay

## 2023-04-29 DIAGNOSIS — K5792 Diverticulitis of intestine, part unspecified, without perforation or abscess without bleeding: Secondary | ICD-10-CM | POA: Diagnosis not present

## 2023-04-29 DIAGNOSIS — M47816 Spondylosis without myelopathy or radiculopathy, lumbar region: Secondary | ICD-10-CM | POA: Diagnosis not present

## 2023-04-29 DIAGNOSIS — K589 Irritable bowel syndrome without diarrhea: Secondary | ICD-10-CM | POA: Diagnosis not present

## 2023-04-29 DIAGNOSIS — K219 Gastro-esophageal reflux disease without esophagitis: Secondary | ICD-10-CM | POA: Diagnosis not present

## 2023-04-29 DIAGNOSIS — M109 Gout, unspecified: Secondary | ICD-10-CM | POA: Diagnosis not present

## 2023-04-29 DIAGNOSIS — I2510 Atherosclerotic heart disease of native coronary artery without angina pectoris: Secondary | ICD-10-CM | POA: Diagnosis not present

## 2023-04-29 DIAGNOSIS — M1612 Unilateral primary osteoarthritis, left hip: Secondary | ICD-10-CM | POA: Diagnosis not present

## 2023-04-29 DIAGNOSIS — E119 Type 2 diabetes mellitus without complications: Secondary | ICD-10-CM | POA: Diagnosis not present

## 2023-04-29 DIAGNOSIS — R21 Rash and other nonspecific skin eruption: Secondary | ICD-10-CM | POA: Diagnosis not present

## 2023-04-29 DIAGNOSIS — E039 Hypothyroidism, unspecified: Secondary | ICD-10-CM | POA: Diagnosis not present

## 2023-04-29 DIAGNOSIS — Z7984 Long term (current) use of oral hypoglycemic drugs: Secondary | ICD-10-CM | POA: Diagnosis not present

## 2023-04-29 DIAGNOSIS — I10 Essential (primary) hypertension: Secondary | ICD-10-CM | POA: Diagnosis not present

## 2023-04-29 DIAGNOSIS — Z7982 Long term (current) use of aspirin: Secondary | ICD-10-CM | POA: Diagnosis not present

## 2023-04-29 DIAGNOSIS — Z91040 Latex allergy status: Secondary | ICD-10-CM | POA: Diagnosis not present

## 2023-04-29 DIAGNOSIS — Z87442 Personal history of urinary calculi: Secondary | ICD-10-CM | POA: Diagnosis not present

## 2023-04-29 DIAGNOSIS — Z85828 Personal history of other malignant neoplasm of skin: Secondary | ICD-10-CM | POA: Diagnosis not present

## 2023-04-29 DIAGNOSIS — M542 Cervicalgia: Secondary | ICD-10-CM | POA: Diagnosis not present

## 2023-04-29 DIAGNOSIS — Z79899 Other long term (current) drug therapy: Secondary | ICD-10-CM | POA: Diagnosis not present

## 2023-04-29 DIAGNOSIS — E7800 Pure hypercholesterolemia, unspecified: Secondary | ICD-10-CM | POA: Diagnosis not present

## 2023-04-29 LAB — BASIC METABOLIC PANEL
Anion gap: 10 (ref 5–15)
BUN: 15 mg/dL (ref 8–23)
CO2: 22 mmol/L (ref 22–32)
Calcium: 9.3 mg/dL (ref 8.9–10.3)
Chloride: 104 mmol/L (ref 98–111)
Creatinine, Ser: 0.95 mg/dL (ref 0.44–1.00)
GFR, Estimated: >60 mL/min (ref >60)
Glucose, Bld: 159 mg/dL — ABNORMAL HIGH (ref 70–99)
Potassium: 4.6 mmol/L (ref 3.5–5.1)
Sodium: 136 mmol/L (ref 135–145)

## 2023-04-29 LAB — CBC
HCT: 36.5 % (ref 36.0–46.0)
Hemoglobin: 11.4 g/dL — ABNORMAL LOW (ref 12.0–15.0)
MCH: 28.8 pg (ref 26.0–34.0)
MCHC: 31.2 g/dL (ref 30.0–36.0)
MCV: 92.2 fL (ref 80.0–100.0)
Platelets: 266 10*3/uL (ref 150–400)
RBC: 3.96 MIL/uL (ref 3.87–5.11)
RDW: 13.5 % (ref 11.5–15.5)
WBC: 13.6 10*3/uL — ABNORMAL HIGH (ref 4.0–10.5)
nRBC: 0 % (ref 0.0–0.2)

## 2023-04-29 MED ORDER — ONDANSETRON HCL 4 MG PO TABS
4.0000 mg | ORAL_TABLET | Freq: Four times a day (QID) | ORAL | 0 refills | Status: AC | PRN
  Filled 2023-04-29: qty 20, 5d supply, fill #0

## 2023-04-29 MED ORDER — ORAL CARE MOUTH RINSE: 15.0000 mL | OROMUCOSAL | Status: DC | PRN

## 2023-04-29 MED ORDER — DIPHENHYDRAMINE HCL 25 MG PO CAPS
25.0000 mg | ORAL_CAPSULE | Freq: Once | ORAL | Status: AC
  Administered 2023-04-29: 25 mg via ORAL
  Filled 2023-04-29: qty 1

## 2023-04-29 MED ORDER — HYDROMORPHONE HCL 2 MG PO TABS
2.0000 mg | ORAL_TABLET | Freq: Four times a day (QID) | ORAL | 0 refills | Status: DC | PRN
Start: 2023-04-29 — End: 2023-09-24
  Filled 2023-04-29: qty 42, 6d supply, fill #0

## 2023-04-29 MED ORDER — ASPIRIN 81 MG PO CHEW
81.0000 mg | CHEWABLE_TABLET | Freq: Two times a day (BID) | ORAL | 0 refills | Status: AC
  Filled 2023-04-29: qty 42, 21d supply, fill #0

## 2023-04-29 NOTE — Progress Notes (Signed)
 Physical Therapy Treatment Patient Details Name: Jillian [name] MRN: [id number] DOB: [birth year] Today's Date: 04/29/2023   History of Present Illness 78 YO female S/P LTHA- DA on 04/28/23. PMH: HTN,DM,gout,CAD, exploratory lap    PT Comments  Patient  awakened from deep sleep. Patient  requiring mod assistance for ambulating 15' became weaker  ,recliner brought up.  Patient  stated that she was  scheduled for  a hip replacement, disoriented this PM. Patient will most like require multiple days  of  therapy to achieve  PT goals for safe DC. Patient's spouse is unable to  provide any  physical support.    If plan is discharge home, recommend the following: Two people to help with walking and/or transfers;Assist for transportation;Help with stairs or ramp for entrance;A lot of help with walking and/or transfers   Can travel by private vehicle        Equipment Recommendations  Rolling walker (2 wheels);BSC/3in1    Recommendations for Other Services  OT     Precautions / Restrictions Precautions Precautions: Fall Restrictions Weight Bearing Restrictions Per Provider Order: No LLE Weight Bearing Per Provider Order: Weight bearing as tolerated     Mobility  Bed Mobility     General bed mobility comments: in recliner    Transfers Overall transfer level: Needs assistance Equipment used: Rolling walker (2 wheels) Transfers: Sit to/from Stand, Bed to chair/wheelchair/BSC Sit to Stand: Mod assist   Step pivot transfers: Mod assist       General transfer comment: multimodal cues to stand from recliner, cues for hand and  left leg position. step pivot to [facility] and back to recliner using RW and mod assistance, cues for posture and  staying on task.    Ambulation/Gait Ambulation/Gait assistance: Mod assist, +2 safety/equipment Gait Distance (Feet): 15 Feet Assistive device: Rolling walker (2 wheels) Gait Pattern/deviations: Step-to pattern, Antalgic, Decreased step length -  left, Decreased stance time - left Gait velocity: dec     General Gait Details: cues for position inside RW and posture, forward flexed, patient beginning  to demonstrate poor steps and trunk more flexed, recliner brought up   Stairs             Wheelchair Mobility     Tilt Bed    Modified Rankin (Stroke Patients Only)       Balance Overall balance assessment: Needs assistance Sitting-balance support: Feet supported, Bilateral upper extremity supported Sitting balance-Leahy Scale: Fair     Standing balance support: During functional activity, Bilateral upper extremity supported, Reliant on assistive device for balance Standing balance-Leahy Scale: Poor                              Communication Communication Communication: No apparent difficulties  Cognition Arousal: Lethargic, Suspect due to medications Behavior During Therapy: Anxious, WFL for tasks assessed/performed   PT - Cognitive impairments: Difficult to assess, Awareness, Orientation Difficult to assess due to: Impaired communication Orientation impairments: Time                   PT - Cognition Comments: patient stated ythat she was  scheduled for hip surgery tomorrow. speach slurred Following commands: Impaired Following commands impaired: Follows one step commands inconsistently    Cueing Cueing Techniques: Verbal cues, Gestural cues  Exercises      General Comments        Pertinent Vitals/Pain Pain Assessment Pain Assessment: 0-10 Pain Score: 8  Pain  Location: left hip when WB Pain Descriptors / Indicators: Discomfort Pain Intervention(s): Monitored during session, Premedicated before session, Ice applied    Home Living Family/patient expects to be discharged to:: Private residence Living Arrangements: Spouse/significant other Available Help at Discharge: Family;Available PRN/intermittently Type of Home: House Home Access: Level entry       Home Layout: One  level Home Equipment: Agricultural consultant (2 wheels) Additional Comments: spouse is unable to assist patient physically    Prior Function            PT Goals (current goals can now be found in the care plan section) Acute Rehab PT Goals Patient Stated Goal: go home PT Goal Formulation: With patient Time For Goal Achievement: 05/06/23 Potential to Achieve Goals: Good Progress towards PT goals: Not progressing toward goals - comment    Frequency    7X/week      PT Plan      Co-evaluation              AM-PAC PT "6 Clicks" Mobility   Outcome Measure  Help needed turning from your back to your side while in a flat bed without using bedrails?: A Lot Help needed moving from lying on your back to sitting on the side of a flat bed without using bedrails?: A Lot Help needed moving to and from a bed to a chair (including a wheelchair)?: A Lot Help needed standing up from a chair using your arms (e.g., wheelchair or bedside chair)?: A Lot Help needed to walk in hospital room?: Total Help needed climbing 3-5 steps with a railing? : Total 6 Click Score: 10    End of Session Equipment Utilized During Treatment: Gait belt Activity Tolerance: Patient limited by fatigue Patient left: in chair;with call bell/phone within reach;with chair alarm set Nurse Communication: Mobility status PT Visit Diagnosis: Muscle weakness (generalized) (M62.81);Unsteadiness on feet (R26.81);Pain Pain - Right/Left: Left Pain - part of body: Hip     Time: 1434-1500 PT Time Calculation (min) (ACUTE ONLY): 26 min  Charges:    $Gait Training: 8-22 mins $Therapeutic Activity: 8-22 mins PT General Charges $$ ACUTE PT VISIT: 1 Visit                     Blanchard Kelch PT Acute Rehabilitation Services Office [phone] Weekend page }   Rada Hay 04/29/2023, 4:13 PM

## 2023-04-29 NOTE — Evaluation (Signed)
 Physical Therapy Evaluation Patient Details Name: Jillian [name] MRN: [id number] DOB: 27-Oct-1945 Today's Date: 04/29/2023  History of Present Illness  78 YO female S/P LTHA- DA on 04/28/23. PMH: HTN,DM,gout,CAD, exploratory lap  Clinical Impression  Pt admitted with above diagnosis.  Pt currently with functional limitations due to the deficits listed below (see PT Problem List). Pt will benefit from acute skilled PT to increase their independence and safety with mobility to allow discharge.     The patient is drowsy, required frequent cues  for activity/mobility and mod assistance   for bed mobility and transfer to [facility], and Mod assistance to ambulate 20' using RW  Patient  reports spouse is unable to provide any  physical support due to his own medical issues. Patient will need to be  supervision level for mobility and ambulation with A RW at Dc. Anticipate that patient require extra days  to achieve PT goals for safe DC home.      If plan is discharge home, recommend the following: Two people to help with walking and/or transfers;Assist for transportation;Help with stairs or ramp for entrance;A lot of help with walking and/or transfers   Can travel by private vehicle        Equipment Recommendations Rolling walker (2 wheels);BSC/3in1  Recommendations for Other Services       Functional Status Assessment Patient has had a recent decline in their functional status and demonstrates the ability to make significant improvements in function in a reasonable and predictable amount of time.     Precautions / Restrictions Precautions Precautions: Fall Restrictions Weight Bearing Restrictions Per Provider Order: No LLE Weight Bearing Per Provider Order: Weight bearing as tolerated      Mobility  Bed Mobility Overal bed mobility: Needs Assistance Bed Mobility: Supine to Sit     Supine to sit: Mod assist          Transfers Overall transfer level: Needs assistance Equipment  used: Rolling walker (2 wheels) Transfers: Sit to/from Stand, Bed to chair/wheelchair/BSC Sit to Stand: Mod assist   Step pivot transfers: Mod assist       General transfer comment: multimodal cues to stand from Bed a Rw,  step to  [facility] Cues for hand placment    Ambulation/Gait Ambulation/Gait assistance: Mod assist, +2 safety/equipment Gait Distance (Feet): 20 Feet Assistive device: Rolling walker (2 wheels) Gait Pattern/deviations: Step-to pattern, Antalgic, Decreased step length - left, Decreased stance time - left Gait velocity: dec     General Gait Details: cues for position inside RW and posture, forward flexed  Stairs            Wheelchair Mobility     Tilt Bed    Modified Rankin (Stroke Patients Only)       Balance Overall balance assessment: Needs assistance Sitting-balance support: Feet supported, Bilateral upper extremity supported Sitting balance-Leahy Scale: Fair     Standing balance support: During functional activity, Bilateral upper extremity supported, Reliant on assistive device for balance Standing balance-Leahy Scale: Poor                               Pertinent Vitals/Pain Pain Assessment Pain Assessment: 0-10 Pain Score: 7  Pain Location: left hip Pain Descriptors / Indicators: Discomfort Pain Intervention(s): Monitored during session, Premedicated before session, Ice applied    Home Living Family/patient expects to be discharged to:: Private residence Living Arrangements: Spouse/significant other Available Help at Discharge: Family;Available PRN/intermittently Type of  Home: House Home Access: Level entry       Home Layout: One level Home Equipment: Agricultural consultant (2 wheels) Additional Comments: spouse is unable to assist patient physically    Prior Function Prior Level of Function : Independent/Modified Independent             Mobility Comments: reports leaning forward posture       Extremity/Trunk  Assessment   Upper Extremity Assessment Upper Extremity Assessment: Overall WFL for tasks assessed    Lower Extremity Assessment Lower Extremity Assessment: LLE deficits/detail LLE Deficits / Details: requires support to move the leg to the bed edge    Cervical / Trunk Assessment Cervical / Trunk Assessment: Other exceptions Cervical / Trunk Exceptions: forward flexed runk in sanding  Communication   Communication Communication: No apparent difficulties    Cognition Arousal: Lethargic, Suspect due to medications Behavior During Therapy: Anxious, WFL for tasks assessed/performed   PT - Cognitive impairments: Difficult to assess, Awareness, Orientation   Orientation impairments: Time                   PT - Cognition Comments: frequent cues to stay on task, Following commands: Impaired Following commands impaired: Follows one step commands inconsistently     Cueing Cueing Techniques: Verbal cues, Gestural cues     General Comments      Exercises     Assessment/Plan    PT Assessment Patient needs continued PT services  PT Problem List Decreased strength;Decreased mobility;Decreased safety awareness;Decreased range of motion;Decreased knowledge of precautions;Decreased activity tolerance;Decreased balance;Pain       PT Treatment Interventions DME instruction;Therapeutic activities;Gait training;Therapeutic exercise;Patient/family education;Functional mobility training    PT Goals (Current goals can be found in the Care Plan section)  Acute Rehab PT Goals Patient Stated Goal: go home PT Goal Formulation: With patient Time For Goal Achievement: 05/06/23 Potential to Achieve Goals: Good    Frequency 7X/week     Co-evaluation               AM-PAC PT "6 Clicks" Mobility  Outcome Measure Help needed turning from your back to your side while in a flat bed without using bedrails?: A Lot Help needed moving from lying on your back to sitting on the side  of a flat bed without using bedrails?: A Lot Help needed moving to and from a bed to a chair (including a wheelchair)?: A Lot Help needed standing up from a chair using your arms (e.g., wheelchair or bedside chair)?: A Lot Help needed to walk in hospital room?: A Lot Help needed climbing 3-5 steps with a railing? : Total 6 Click Score: 11    End of Session Equipment Utilized During Treatment: Gait belt Activity Tolerance: Patient limited by fatigue Patient left: in chair;with call bell/phone within reach;with chair alarm set;with family/visitor present Nurse Communication: Mobility status PT Visit Diagnosis: Muscle weakness (generalized) (M62.81);Unsteadiness on feet (R26.81);Pain Pain - Right/Left: Left Pain - part of body: Hip    Time: 1005-1030 PT Time Calculation (min) (ACUTE ONLY): 25 min   Charges:   PT Evaluation $PT Eval Low Complexity: 1 Low PT Treatments $Gait Training: 8-22 mins PT General Charges $$ ACUTE PT VISIT: 1 Visit         Blanchard Kelch PT Acute Rehabilitation Services Office [phone] Weekend pager-[phone]   Rada Hay 04/29/2023, 4:04 PM

## 2023-04-29 NOTE — Progress Notes (Addendum)
 Patient requesting benadryl for itching. Paged [facility]. New order for benadryl PO one-time ordered and given. Will continue to monitor.

## 2023-04-29 NOTE — Progress Notes (Signed)
   Subjective: 1 Day Post-Op Procedure(s) (LRB): LEFT TOTAL HIP ARTHROPLASTY ANTERIOR APPROACH (Left) Patient reports pain as mild.   Patient seen in rounds by Dr. Lequita Halt. Patient is well, and has had no acute complaints or problems. Denies chest pain or SOB. No issues overnight. Foley catheter removed this AM. We will begin therapy today  Objective: Vital signs in last 24 hours: Temp:  [96.6 F (35.9 C)-98.7 F (37.1 C)] 98.7 F (37.1 C) (03/04 0638) Pulse Rate:  [86-101] 99 (03/04 0638) Resp:  [11-23] 17 (03/04 0638) BP: (133-178)/(63-86) 135/63 (03/04 0638) SpO2:  [92 %-100 %] 100 % (03/04 1610) Weight:  [84 kg] 84 kg (03/03 1208)  Intake/Output from previous day:  Intake/Output Summary (Last 24 hours) at 04/29/2023 0825 Last data filed at 04/29/2023 0645 Gross per 24 hour  Intake 2541.11 ml  Output 2050 ml  Net 491.11 ml     Intake/Output this shift: No intake/output data recorded.  Labs: Recent Labs    04/29/23 0343  HGB 11.4*   Recent Labs    04/29/23 0343  WBC 13.6*  RBC 3.96  HCT 36.5  PLT 266   Recent Labs    04/29/23 0343  NA 136  K 4.6  CL 104  CO2 22  BUN 15  CREATININE 0.95  GLUCOSE 159*  CALCIUM 9.3   No results for input(s): "LABPT", "INR" in the last 72 hours.  Exam: General - Patient is Alert and Oriented Extremity - Neurologically intact Neurovascular intact Sensation intact distally Dorsiflexion/Plantar flexion intact Dressing - dressing C/D/I Motor Function - intact, moving foot and toes well on exam.   Past Medical History:  Diagnosis Date   Allergy    Anxiety    Bowel obstruction (HCC)    Cancer (HCC)    skin cancer had Mohs Surgery   Cataract    Coronary artery disease    Diabetes mellitus without complication (HCC)    Diverticulitis    GERD (gastroesophageal reflux disease)    Gout    H/O unstable angina    Headache    High cholesterol    History of kidney stones    Hypertension    Hypothyroidism    IBS  (irritable bowel syndrome)    Neck pain    PONV (postoperative nausea and vomiting)    Renovascular hypertension    Spondylosis of lumbar spine     Assessment/Plan: 1 Day Post-Op Procedure(s) (LRB): LEFT TOTAL HIP ARTHROPLASTY ANTERIOR APPROACH (Left) Principal Problem:   OA (osteoarthritis) of hip Active Problems:   Primary osteoarthritis of left hip  Estimated body mass index is 32.8 kg/m as calculated from the following:   Height as of this encounter: 5\' 3"  (1.6 m).   Weight as of this encounter: 84 kg. Advance diet Up with therapy D/C IV fluids  DVT Prophylaxis - Aspirin Weight bearing as tolerated. Begin therapy.  Plan is to go Home after hospital stay. Plan for discharge with HEP later today if progresses with therapy and meeting goals. Follow-up in the office in 2 weeks.  The PDMP database was reviewed today prior to any opioid medications being prescribed to this patient.  Arther Abbott, PA-C Orthopedic Surgery [phone] 04/29/2023, 8:25 AM

## 2023-04-29 NOTE — Care Management Obs Status (Signed)
 MEDICARE OBSERVATION STATUS NOTIFICATION   Patient Details  Name: Jillian [name] MRN: [id number] Date of Birth: 06/12/1945   Medicare Observation Status Notification Given:  Yes    Howell Rucks, RN 04/29/2023, 9:53 AM

## 2023-04-29 NOTE — TOC Transition Note (Signed)
 Transition of Care [facility]) - Discharge Note   Patient Details  Name: Jillian [name] MRN: [id number] Date of Birth: 07-Jun-1945  [facility] CM/SW Contact:  Howell Rucks, RN Phone Number: 04/29/2023, 11:24 AM   Clinical Narrative:  Met with pt at bedside to review dc therapy and home DME needs, pt confirmed HEP, [employer] to deliver RW and BSC to bedside. No TOC needs.      Final next level of care: Home/Self Care Barriers to Discharge: No Barriers Identified   Patient Goals and CMS Choice Patient states their goals for this hospitalization and ongoing recovery are:: return home          Discharge Placement                       Discharge Plan and Services Additional resources added to the After Visit Summary for                                       Social Drivers of Health (SDOH) Interventions SDOH Screenings   Food Insecurity: No Food Insecurity (04/28/2023)  Housing: Low Risk  (04/28/2023)  Transportation Needs: No Transportation Needs (04/28/2023)  Utilities: Not At Risk (04/28/2023)  Social Connections: Socially Integrated (04/28/2023)  Stress: Stress Concern Present (09/26/2020)   Received from [facility]  Tobacco Use: Low Risk  (04/28/2023)     Readmission Risk Interventions     No data to display

## 2023-04-29 NOTE — Plan of Care (Signed)

## 2023-04-29 NOTE — Plan of Care (Signed)

## 2023-04-30 ENCOUNTER — Other Ambulatory Visit (HOSPITAL_COMMUNITY): Payer: Self-pay

## 2023-04-30 DIAGNOSIS — Z85828 Personal history of other malignant neoplasm of skin: Secondary | ICD-10-CM | POA: Diagnosis not present

## 2023-04-30 DIAGNOSIS — Z79899 Other long term (current) drug therapy: Secondary | ICD-10-CM | POA: Diagnosis not present

## 2023-04-30 DIAGNOSIS — E7800 Pure hypercholesterolemia, unspecified: Secondary | ICD-10-CM | POA: Diagnosis not present

## 2023-04-30 DIAGNOSIS — M1612 Unilateral primary osteoarthritis, left hip: Secondary | ICD-10-CM | POA: Diagnosis not present

## 2023-04-30 DIAGNOSIS — I10 Essential (primary) hypertension: Secondary | ICD-10-CM | POA: Diagnosis not present

## 2023-04-30 DIAGNOSIS — M542 Cervicalgia: Secondary | ICD-10-CM | POA: Diagnosis not present

## 2023-04-30 DIAGNOSIS — R21 Rash and other nonspecific skin eruption: Secondary | ICD-10-CM | POA: Diagnosis not present

## 2023-04-30 DIAGNOSIS — Z7982 Long term (current) use of aspirin: Secondary | ICD-10-CM | POA: Diagnosis not present

## 2023-04-30 DIAGNOSIS — E039 Hypothyroidism, unspecified: Secondary | ICD-10-CM | POA: Diagnosis not present

## 2023-04-30 DIAGNOSIS — K5792 Diverticulitis of intestine, part unspecified, without perforation or abscess without bleeding: Secondary | ICD-10-CM | POA: Diagnosis not present

## 2023-04-30 DIAGNOSIS — I2510 Atherosclerotic heart disease of native coronary artery without angina pectoris: Secondary | ICD-10-CM | POA: Diagnosis not present

## 2023-04-30 DIAGNOSIS — Z87442 Personal history of urinary calculi: Secondary | ICD-10-CM | POA: Diagnosis not present

## 2023-04-30 DIAGNOSIS — M109 Gout, unspecified: Secondary | ICD-10-CM | POA: Diagnosis not present

## 2023-04-30 DIAGNOSIS — K589 Irritable bowel syndrome without diarrhea: Secondary | ICD-10-CM | POA: Diagnosis not present

## 2023-04-30 DIAGNOSIS — M47816 Spondylosis without myelopathy or radiculopathy, lumbar region: Secondary | ICD-10-CM | POA: Diagnosis not present

## 2023-04-30 DIAGNOSIS — K219 Gastro-esophageal reflux disease without esophagitis: Secondary | ICD-10-CM | POA: Diagnosis not present

## 2023-04-30 DIAGNOSIS — Z91040 Latex allergy status: Secondary | ICD-10-CM | POA: Diagnosis not present

## 2023-04-30 DIAGNOSIS — E119 Type 2 diabetes mellitus without complications: Secondary | ICD-10-CM | POA: Diagnosis not present

## 2023-04-30 DIAGNOSIS — Z7984 Long term (current) use of oral hypoglycemic drugs: Secondary | ICD-10-CM | POA: Diagnosis not present

## 2023-04-30 LAB — CBC
HCT: 35.1 % — ABNORMAL LOW (ref 36.0–46.0)
Hemoglobin: 10.9 g/dL — ABNORMAL LOW (ref 12.0–15.0)
MCH: 29 pg (ref 26.0–34.0)
MCHC: 31.1 g/dL (ref 30.0–36.0)
MCV: 93.4 fL (ref 80.0–100.0)
Platelets: 252 10*3/uL (ref 150–400)
RBC: 3.76 MIL/uL — ABNORMAL LOW (ref 3.87–5.11)
RDW: 14.1 % (ref 11.5–15.5)
WBC: 17 10*3/uL — ABNORMAL HIGH (ref 4.0–10.5)
nRBC: 0 % (ref 0.0–0.2)

## 2023-04-30 NOTE — Progress Notes (Signed)
   Subjective: 2 Days Post-Op Procedure(s) (LRB): LEFT TOTAL HIP ARTHROPLASTY ANTERIOR APPROACH (Left) Patient seen in rounds by Dr. Lequita Halt. No issues overnight. Patient reports pain as moderate. Improving.  Objective: Vital signs in last 24 hours: Temp:  [97 F (36.1 C)-98.2 F (36.8 C)] 97.5 F (36.4 C) (03/05 0615) Pulse Rate:  [95-103] 95 (03/05 0615) Resp:  [16-18] 17 (03/05 0615) BP: (123-148)/(63-72) 125/64 (03/05 0615) SpO2:  [97 %-100 %] 100 % (03/05 0615)  Intake/Output from previous day:  Intake/Output Summary (Last 24 hours) at 04/30/2023 0825 Last data filed at 04/29/2023 1928 Gross per 24 hour  Intake 240 ml  Output 1250 ml  Net -1010 ml    Intake/Output this shift: No intake/output data recorded.  Labs: Recent Labs    04/29/23 0343 04/30/23 0345  HGB 11.4* 10.9*   Recent Labs    04/29/23 0343 04/30/23 0345  WBC 13.6* 17.0*  RBC 3.96 3.76*  HCT 36.5 35.1*  PLT 266 252   Recent Labs    04/29/23 0343  NA 136  K 4.6  CL 104  CO2 22  BUN 15  CREATININE 0.95  GLUCOSE 159*  CALCIUM 9.3   No results for input(s): "LABPT", "INR" in the last 72 hours.  Exam: General - Patient is Alert and Oriented Extremity - Neurologically intact Neurovascular intact Sensation intact distally Dorsiflexion/Plantar flexion intact Dressing/Incision - clean, dry, no drainage Motor Function - intact, moving foot and toes well on exam.  Past Medical History:  Diagnosis Date   Allergy    Anxiety    Bowel obstruction (HCC)    Cancer (HCC)    skin cancer had Mohs Surgery   Cataract    Coronary artery disease    Diabetes mellitus without complication (HCC)    Diverticulitis    GERD (gastroesophageal reflux disease)    Gout    H/O unstable angina    Headache    High cholesterol    History of kidney stones    Hypertension    Hypothyroidism    IBS (irritable bowel syndrome)    Neck pain    PONV (postoperative nausea and vomiting)    Renovascular  hypertension    Spondylosis of lumbar spine     Assessment/Plan: 2 Days Post-Op Procedure(s) (LRB): LEFT TOTAL HIP ARTHROPLASTY ANTERIOR APPROACH (Left) Principal Problem:   OA (osteoarthritis) of hip Active Problems:   Primary osteoarthritis of left hip  Estimated body mass index is 32.8 kg/m as calculated from the following:   Height as of this encounter: 5\' 3"  (1.6 m).   Weight as of this encounter: 84 kg.  DVT Prophylaxis - Aspirin Weight-bearing as tolerated.  Continue physical therapy today. Possible discharge home pending progress and if meeting goals. Plan for HEP once discharged. F/u in clinic in 2 weeks.  Alfonzo Feller, PA-C Orthopedic Surgery [phone] 04/30/2023, 8:25 AM

## 2023-04-30 NOTE — Progress Notes (Signed)
 Physical Therapy Treatment Patient Details Name: Jillian [name] MRN: [id number] DOB: 1945/03/17 Today's Date: 04/30/2023   History of Present Illness 78 YO female S/P LTHA- DA on 04/28/23. PMH: HTN,DM,gout,CAD, exploratory lap    PT Comments  Patient's MS improved. Continues to make very slow progress in  mobility. Ambulated x 15', quite fatigued.  Patient reports that she sleeps in a lift recliner so does not need to worry about bed mobility(Stated  that she has a very high bed). Continue PT. Patient has not met PT  goals for safe DC home. Spouse unable to provide any physical support.    If plan is discharge home, recommend the following: Two people to help with walking and/or transfers;Assist for transportation;Help with stairs or ramp for entrance;A lot of help with walking and/or transfers   Can travel by private vehicle        Equipment Recommendations  Rolling walker (2 wheels);BSC/3in1    Recommendations for Other Services       Precautions / Restrictions Precautions Precautions: Fall Restrictions Weight Bearing Restrictions Per Provider Order: No LLE Weight Bearing Per Provider Order: Weight bearing as tolerated     Mobility  Bed Mobility   Bed Mobility: Supine to Sit     Supine to sit: HOB elevated, Used rails     General bed mobility comments: used belt on LLE, mod support to  sit upright. Patient reports that she sleeps in a lift recliner    Transfers Overall transfer level: Needs assistance Equipment used: Rolling walker (2 wheels) Transfers: Sit to/from Stand Sit to Stand: Mod assist           General transfer comment: multimodal cues to stand frombed, cues for hand and  left leg position    Ambulation/Gait Ambulation/Gait assistance: Min assist Gait Distance (Feet): 15 Feet Assistive device: Rolling walker (2 wheels) Gait Pattern/deviations: Step-to pattern, Antalgic, Decreased step length - left, Decreased stance time - left Gait  velocity: dec     General Gait Details: slow to take steps, trunk flexed. pt. reports did not stand erect PTA, gait is very slow   Optometrist     Tilt Bed    Modified Rankin (Stroke Patients Only)       Balance Overall balance assessment: Needs assistance Sitting-balance support: Feet supported, Bilateral upper extremity supported Sitting balance-Leahy Scale: Fair     Standing balance support: During functional activity, Bilateral upper extremity supported, Reliant on assistive device for balance Standing balance-Leahy Scale: Poor                              Communication Communication Communication: No apparent difficulties  Cognition Arousal: Alert Behavior During Therapy: WFL for tasks assessed/performed   PT - Cognitive impairments: No apparent impairments                       PT - Cognition Comments: MS much improved Following commands: Intact Following commands impaired: Only follows one step commands consistently    Cueing Cueing Techniques: Verbal cues  Exercises Total Joint Exercises Ankle Circles/Pumps: AROM, Both Heel Slides: AAROM, 10 reps, Supine, Left Hip ABduction/ADduction: AAROM, Supine, 10 reps, Left Long Arc Quad: AROM, AAROM, Left, 10 reps    General Comments        Pertinent Vitals/Pain Pain Assessment Pain Score: 7  Pain Descriptors / Indicators: Discomfort,  Grimacing, Guarding Pain Intervention(s): Monitored during session, Premedicated before session    Home Living                          Prior Function            PT Goals (current goals can now be found in the care plan section) Progress towards PT goals: Progressing toward goals    Frequency    7X/week      PT Plan      Co-evaluation              AM-PAC PT "6 Clicks" Mobility   Outcome Measure  Help needed turning from your back to your side while in a flat bed without using bedrails?:  A Lot Help needed moving from lying on your back to sitting on the side of a flat bed without using bedrails?: A Lot Help needed moving to and from a bed to a chair (including a wheelchair)?: A Lot Help needed standing up from a chair using your arms (e.g., wheelchair or bedside chair)?: A Lot Help needed to walk in hospital room?: A Lot Help needed climbing 3-5 steps with a railing? : Total 6 Click Score: 11    End of Session Equipment Utilized During Treatment: Gait belt Activity Tolerance: Patient limited by fatigue Patient left: in chair;with call bell/phone within reach;with chair alarm set Nurse Communication: Mobility status PT Visit Diagnosis: Muscle weakness (generalized) (M62.81);Unsteadiness on feet (R26.81);Pain Pain - part of body: Hip     Time: 1051-1130 PT Time Calculation (min) (ACUTE ONLY): 39 min  Charges:    $Gait Training: 8-22 mins $Therapeutic Exercise: 8-22 mins $Self Care/Home Management: 8-22 PT General Charges $$ ACUTE PT VISIT: 1 Visit                     Blanchard Kelch PT Acute Rehabilitation Services Office [phone]     Rada Hay 04/30/2023, 12:51 PM

## 2023-04-30 NOTE — Progress Notes (Signed)
 Physical Therapy Treatment Patient Details Name: Jillian [name] MRN: [id number] DOB: 1946-02-25 Today's Date: 04/30/2023   History of Present Illness 78 YO female S/P LTHA- DA on 04/28/23. PMH: HTN,DM,gout,CAD, exploratory lap    PT Comments  Patient reports  spasms in right groin. Patient  did ambulate 84' using the RW, barely made  t to recliner after the distance.Progress is slow , patient fatigues  quickly . Continue PT until reaches a level of safe ambulation  and mobility to return home with limited support. . Can benefit from HHPT   If plan is discharge home, recommend the following: Two people to help with walking and/or transfers;Assist for transportation;Help with stairs or ramp for entrance;A lot of help with walking and/or transfers   Can travel by private vehicle        Equipment Recommendations  Rolling walker (2 wheels);BSC/3in1    Recommendations for Other Services       Precautions / Restrictions Precautions Precautions: Fall Restrictions Weight Bearing Restrictions Per Provider Order: No LLE Weight Bearing Per Provider Order: Weight bearing as tolerated     Mobility  Bed Mobility   Bed Mobility: Supine to Sit     Supine to sit: HOB elevated, Used rails     General bed mobility comments: in recliner    Transfers Overall transfer level: Needs assistance Equipment used: Rolling walker (2 wheels) Transfers: Sit to/from Stand Sit to Stand: Min assist           General transfer comment: multimodal cues to stand fromrecliner, cues for hand and  left leg position, patient very fatigued  getting back to the reclner after amb    Ambulation/Gait Ambulation/Gait assistance: Min assist Gait Distance (Feet): 30 Feet Assistive device: Rolling walker (2 wheels) Gait Pattern/deviations: Step-to pattern, Antalgic, Decreased step length - left, Decreased stance time - left Gait velocity: dec     General Gait Details: slow to take steps, trunk flexed.  pt. reports did not stand erect PTA, gait is very slow, very fatigued after distance   Stairs             Wheelchair Mobility     Tilt Bed    Modified Rankin (Stroke Patients Only)       Balance Overall balance assessment: Needs assistance Sitting-balance support: Feet supported, Bilateral upper extremity supported Sitting balance-Leahy Scale: Fair     Standing balance support: During functional activity, Bilateral upper extremity supported, Reliant on assistive device for balance Standing balance-Leahy Scale: Poor                              Communication Communication Communication: No apparent difficulties  Cognition Arousal: Alert Behavior During Therapy: WFL for tasks assessed/performed   PT - Cognitive impairments: No apparent impairments                       PT - Cognition Comments: MS much improved Following commands: Intact Following commands impaired: Only follows one step commands consistently    Cueing Cueing Techniques: Verbal cues  Exercises Total Joint Exercises Ankle Circles/Pumps: AROM, Both Heel Slides: AAROM, 10 reps, Supine, Left Hip ABduction/ADduction: AAROM, Supine, 10 reps, Left Long Arc Quad: AROM, AAROM, Left, 10 reps    General Comments General comments (skin integrity, edema, etc.): L hip incision with non removeable dressing c/d/i and OT educated to maintain dry groin after bathing and toileting      Pertinent Vitals/Pain  Pain Assessment Pain Score: 10-Worst pain ever Pain Descriptors / Indicators: Aching, Grimacing, Discomfort, Heaviness Pain Intervention(s): Premedicated before session    Home Living Family/patient expects to be discharged to:: Private residence Living Arrangements: Spouse/significant other Available Help at Discharge: Family;Available PRN/intermittently Type of Home: House Home Access: Level entry       Home Layout: One level Home Equipment: Agricultural consultant (2  wheels) Additional Comments: spouse is unable to assist patient physically    Prior Function            PT Goals (current goals can now be found in the care plan section) Progress towards PT goals: Progressing toward goals    Frequency    7X/week      PT Plan      Co-evaluation              AM-PAC PT "6 Clicks" Mobility   Outcome Measure  Help needed turning from your back to your side while in a flat bed without using bedrails?: A Lot Help needed moving from lying on your back to sitting on the side of a flat bed without using bedrails?: A Lot Help needed moving to and from a bed to a chair (including a wheelchair)?: A Lot Help needed standing up from a chair using your arms (e.g., wheelchair or bedside chair)?: A Little Help needed to walk in hospital room?: A Little Help needed climbing 3-5 steps with a railing? : Total 6 Click Score: 13    End of Session Equipment Utilized During Treatment: Gait belt Activity Tolerance: Patient limited by fatigue Patient left: in chair;with call bell/phone within reach Nurse Communication: Mobility status PT Visit Diagnosis: Muscle weakness (generalized) (M62.81);Unsteadiness on feet (R26.81);Pain Pain - Right/Left: Left Pain - part of body: Hip     Time: 1610-9604 PT Time Calculation (min) (ACUTE ONLY): 25 min  Charges:    $Gait Training: 23-37 mins PT General Charges $$ ACUTE PT VISIT: 1 Visit                     Blanchard Kelch PT Acute Rehabilitation Services Office [phone] Weekend pager-[phone]    Rada Hay 04/30/2023, 4:32 PM

## 2023-04-30 NOTE — Plan of Care (Signed)

## 2023-04-30 NOTE — Evaluation (Signed)
 Occupational Therapy Evaluation Patient Details Name: Jillian [name] MRN: [id number] DOB: [birth year] Today's Date: 04/30/2023   History of Present Illness   78 YO female S/P LTHA- DA on 04/28/23. PMH: HTN,DM,gout,CAD, exploratory lap     Clinical Impressions PTA, pt was living with husband at modified independence level with limitations due to B hip pain. Now post op L THA and presents with pain, decreased overall LE strength, activity tolerance and balance limiting functional mobility/amb and BADL's. Pt reports her husband cannot provide physical assistance at home. OT issued and trained in use of LH sponge for LB bathing and training with reacher for LE dressing. Pt has 3 in 1 and RW now delivered in room and OT progressed with short step pivot transfers and amb to commode and standing tolerance for hygiene and grooming sink side with RW. Pt will benefit from continued OT while in acute to progress to a consistent mod I for discharge home with husband.      If plan is discharge home, recommend the following:   A lot of help with walking and/or transfers;A lot of help with bathing/dressing/bathroom;Assistance with cooking/housework;Direct supervision/assist for medications management;Direct supervision/assist for financial management;Assist for transportation;Help with stairs or ramp for entrance     Functional Status Assessment   Patient has had a recent decline in their functional status and demonstrates the ability to make significant improvements in function in a reasonable and predictable amount of time.     Equipment Recommendations   None recommended by OT (isseud LH sponge, pt already has 3 in 1 and RW ordered and deleivered to room)      Precautions/Restrictions   Restrictions Weight Bearing Restrictions Per Provider Order: No LLE Weight Bearing Per Provider Order: Weight bearing as tolerated     Mobility       Transfers Overall transfer level: Needs  assistance Equipment used: Rolling walker (2 wheels) Transfers: Sit to/from Stand, Bed to chair/wheelchair/BSC Sit to Stand: Min assist     Step pivot transfers: Min assist, From elevated surface     General transfer comment: multimodal cues to stand frombed, cues for hand and  left leg position      Balance Overall balance assessment: Needs assistance Sitting-balance support: Feet supported, Bilateral upper extremity supported Sitting balance-Leahy Scale: Fair     Standing balance support: During functional activity, Bilateral upper extremity supported, Reliant on assistive device for balance Standing balance-Leahy Scale: Poor                             ADL either performed or assessed with clinical judgement   ADL Overall ADL's : Needs assistance/impaired Eating/Feeding: Independent   Grooming: Wash/dry hands;Wash/dry face;Oral care;Modified independent;Sitting;Supervision/safety;Standing Grooming Details (indicate cue type and reason): mod I seated and close S standing sink side with RW Upper Body Bathing: Set up;Sitting   Lower Body Bathing: Moderate assistance;Sit to/from stand;Sitting/lateral leans Lower Body Bathing Details (indicate cue type and reason): issued and trained in use of LH sponge for LB bathing, was able to stand for peri and buttocks hygiene and bathing with CGA Upper Body Dressing : Independent;Sitting   Lower Body Dressing: Moderate assistance;Sitting/lateral leans;Sit to/from stand Lower Body Dressing Details (indicate cue type and reason): educated on use of reacher for pants management post THA, wears slip on shoes without socks Toilet Transfer: Minimal assistance;Ambulation;Rolling walker (2 wheels);BSC/3in1 Toilet Transfer Details (indicate cue type and reason): brought BSC as pt needed voiding uregently but  was ambel to perform short amb with RW and then stood sink side with 3 intervals of 4 min standing tolerance for tasks          Functional mobility during ADLs: Minimal assistance;Rolling walker (2 wheels) General ADL Comments: pain limits tolerance for amb during ADL's, OT educated to bring 3 in 1 next to recliner at home and use in stall shower when able to access safely     Vision Baseline Vision/History: 0 No visual deficits Ability to See in Adequate Light: 0 Adequate Patient Visual Report: No change from baseline Vision Assessment?: No apparent visual deficits     Perception Perception: Within Functional Limits       Praxis Praxis: WFL       Pertinent Vitals/Pain Pain Assessment Pain Assessment: 0-10 Pain Score: 8  Facial Expression: Relaxed, neutral Muscle Tension: Relaxed Compliance with ventilator (intubated pts.): N/A Vocalization (extubated pts.): Talking in normal tone or no sound Pain Location: left hip when WB Pain Descriptors / Indicators: Aching, Grimacing, Discomfort, Heaviness Pain Intervention(s): Limited activity within patient's tolerance, Monitored during session, Repositioned, Patient requesting pain meds-RN notified, Relaxation, Ice applied     Extremity/Trunk Assessment Upper Extremity Assessment Upper Extremity Assessment: Overall WFL for tasks assessed   Lower Extremity Assessment Lower Extremity Assessment: Defer to PT evaluation   Cervical / Trunk Assessment Cervical / Trunk Assessment: Other exceptions Cervical / Trunk Exceptions: forward flexed runk in sanding   Communication Communication Communication: No apparent difficulties   Cognition Arousal: Alert Behavior During Therapy: WFL for tasks assessed/performed Cognition: No apparent impairments                               Following commands: Intact Following commands impaired: Only follows one step commands consistently     Cueing  General Comments   Cueing Techniques: Verbal cues  L hip incision with non removeable dressing c/d/i and OT educated to maintain dry groin after bathing and  toileting           Home Living Family/patient expects to be discharged to:: Private residence Living Arrangements: Spouse/significant other Available Help at Discharge: Family;Available PRN/intermittently Type of Home: House Home Access: Level entry     Home Layout: One level     Bathroom Shower/Tub: Walk-in Pensions consultant: Standard Bathroom Accessibility: Yes How Accessible: Accessible via walker Home Equipment: Rolling Walker (2 wheels)   Additional Comments: spouse is unable to assist patient physically      Prior Functioning/Environment Prior Level of Function : Independent/Modified Independent             Mobility Comments: B hip issues ADLs Comments: had developed groin rashes due to decreased functional reach    OT Problem List: Decreased activity tolerance;Impaired balance (sitting and/or standing);Pain;Decreased knowledge of use of DME or AE   OT Treatment/Interventions: Self-care/ADL training;DME and/or AE instruction;Therapeutic activities;Patient/family education;Balance training      OT Goals(Current goals can be found in the care plan section)   Acute Rehab OT Goals Patient Stated Goal: to do more for myself OT Goal Formulation: With patient Time For Goal Achievement: 05/14/23 Potential to Achieve Goals: Good ADL Goals Pt Will Perform Lower Body Bathing: sit to/from stand;with adaptive equipment;with set-up Pt Will Perform Lower Body Dressing: with set-up;with adaptive equipment;sit to/from stand Pt Will Transfer to Toilet: with supervision;bedside commode;ambulating   OT Frequency:  Min 2X/week    Co-evaluation  AM-PAC OT "6 Clicks" Daily Activity     Outcome Measure Help from another person eating meals?: None Help from another person taking care of personal grooming?: A Little Help from another person toileting, which includes using toliet, bedpan, or urinal?: A Little Help from another person  bathing (including washing, rinsing, drying)?: A Lot Help from another person to put on and taking off regular upper body clothing?: A Little Help from another person to put on and taking off regular lower body clothing?: A Lot 6 Click Score: 17   End of Session Equipment Utilized During Treatment: Gait belt;Rolling walker (2 wheels) Nurse Communication: Mobility status;Patient requests pain meds;Weight bearing status  Activity Tolerance: Patient limited by fatigue;Patient limited by pain Patient left: in chair;with call bell/phone within reach;with chair alarm set  OT Visit Diagnosis: Unsteadiness on feet (R26.81);Pain Pain - Right/Left: Left Pain - part of body: Hip                Time: 1327-1406 OT Time Calculation (min): 39 min Charges:  OT General Charges $OT Visit: 1 Visit OT Evaluation $OT Eval Moderate Complexity: 1 Mod OT Treatments $Self Care/Home Management : 23-37 mins Danilynn Jemison OT/L Acute Rehabilitation Department  [phone]  04/30/2023, 3:22 PM

## 2023-05-01 ENCOUNTER — Other Ambulatory Visit (HOSPITAL_COMMUNITY): Payer: Self-pay

## 2023-05-01 DIAGNOSIS — M109 Gout, unspecified: Secondary | ICD-10-CM | POA: Diagnosis not present

## 2023-05-01 DIAGNOSIS — I2510 Atherosclerotic heart disease of native coronary artery without angina pectoris: Secondary | ICD-10-CM | POA: Diagnosis not present

## 2023-05-01 DIAGNOSIS — E119 Type 2 diabetes mellitus without complications: Secondary | ICD-10-CM | POA: Diagnosis not present

## 2023-05-01 DIAGNOSIS — K219 Gastro-esophageal reflux disease without esophagitis: Secondary | ICD-10-CM | POA: Diagnosis not present

## 2023-05-01 DIAGNOSIS — E7800 Pure hypercholesterolemia, unspecified: Secondary | ICD-10-CM | POA: Diagnosis not present

## 2023-05-01 DIAGNOSIS — M1612 Unilateral primary osteoarthritis, left hip: Secondary | ICD-10-CM | POA: Diagnosis not present

## 2023-05-01 DIAGNOSIS — M47816 Spondylosis without myelopathy or radiculopathy, lumbar region: Secondary | ICD-10-CM | POA: Diagnosis not present

## 2023-05-01 DIAGNOSIS — E039 Hypothyroidism, unspecified: Secondary | ICD-10-CM | POA: Diagnosis not present

## 2023-05-01 DIAGNOSIS — Z85828 Personal history of other malignant neoplasm of skin: Secondary | ICD-10-CM | POA: Diagnosis not present

## 2023-05-01 DIAGNOSIS — Z91040 Latex allergy status: Secondary | ICD-10-CM | POA: Diagnosis not present

## 2023-05-01 DIAGNOSIS — Z7984 Long term (current) use of oral hypoglycemic drugs: Secondary | ICD-10-CM | POA: Diagnosis not present

## 2023-05-01 DIAGNOSIS — K5792 Diverticulitis of intestine, part unspecified, without perforation or abscess without bleeding: Secondary | ICD-10-CM | POA: Diagnosis not present

## 2023-05-01 DIAGNOSIS — K589 Irritable bowel syndrome without diarrhea: Secondary | ICD-10-CM | POA: Diagnosis not present

## 2023-05-01 DIAGNOSIS — Z87442 Personal history of urinary calculi: Secondary | ICD-10-CM | POA: Diagnosis not present

## 2023-05-01 DIAGNOSIS — I10 Essential (primary) hypertension: Secondary | ICD-10-CM | POA: Diagnosis not present

## 2023-05-01 DIAGNOSIS — M542 Cervicalgia: Secondary | ICD-10-CM | POA: Diagnosis not present

## 2023-05-01 NOTE — Plan of Care (Signed)
  Problem: Safety: Goal: Ability to remain free from injury will improve Outcome: Progressing   Problem: Education: Goal: Knowledge of the prescribed therapeutic regimen will improve Outcome: Progressing   Problem: Clinical Measurements: Goal: Postoperative complications will be avoided or minimized Outcome: Progressing   Problem: Pain Management: Goal: Pain level will decrease with appropriate interventions Outcome: Progressing

## 2023-05-01 NOTE — Progress Notes (Signed)
 Physical Therapy Treatment Patient Details Name: Jillian [name] MRN: [id number] DOB: 1945-04-27 Today's Date: 05/01/2023   History of Present Illness 78 YO female S/P LTHA- DA on 04/28/23. PMH: HTN,DM,gout,CAD, exploratory lap    PT Comments  Patient on Purewick. Discussed with patient and  nursing to assist patient to BR. Patient ambulated again x 35' using RW, Trunk flexed, cues for position inside Rw.  Anticipate that patient will Dc home after PT tomorrow.    If plan is discharge home, recommend the following: Assist for transportation;Help with stairs or ramp for entrance;A little help with walking and/or transfers;A little help with bathing/dressing/bathroom   Can travel by private vehicle        Equipment Recommendations  Rolling walker (2 wheels);BSC/3in1    Recommendations for Other Services       Precautions / Restrictions Precautions Precautions: Fall Restrictions LLE Weight Bearing Per Provider Order: Weight bearing as tolerated     Mobility  Bed Mobility               General bed mobility comments: in recliner    Transfers Overall transfer level: Needs assistance Equipment used: Rolling walker (2 wheels) Transfers: Sit to/from Stand Sit to Stand: Min assist           General transfer comment: multimodal cues to stand from recliner, cues for left leg position, required step by step slow cues to sequence descent to recliner with need of Min As to control eccentric movement.    Ambulation/Gait Ambulation/Gait assistance: Min assist Gait Distance (Feet): 35 Feet Assistive device: Rolling walker (2 wheels) Gait Pattern/deviations: Step-to pattern, Antalgic, Decreased step length - left, Decreased stance time - left Gait velocity: dec     General Gait Details: slow to take steps, trunk flexed. pt. reports did not stand erect PTA, gait is very slow, very fatigued after distance   Stairs             Wheelchair Mobility     Tilt  Bed    Modified Rankin (Stroke Patients Only)       Balance Overall balance assessment: Needs assistance Sitting-balance support: Feet supported, Bilateral upper extremity supported Sitting balance-Leahy Scale: Good     Standing balance support: During functional activity, Bilateral upper extremity supported, Reliant on assistive device for balance Standing balance-Leahy Scale: Poor                              Communication    Cognition Arousal: Alert Behavior During Therapy: WFL for tasks assessed/performed                                    Cueing    Exercises      General Comments        Pertinent Vitals/Pain Pain Assessment Pain Score: 7  Pain Location: left hip with any movement Pain Descriptors / Indicators: Aching, Grimacing, Discomfort, Heaviness Pain Intervention(s): Premedicated before session, Ice applied    Home Living                          Prior Function            PT Goals (current goals can now be found in the care plan section) Progress towards PT goals: Progressing toward goals    Frequency    7X/week  PT Plan      Co-evaluation              AM-PAC PT "6 Clicks" Mobility   Outcome Measure  Help needed turning from your back to your side while in a flat bed without using bedrails?: A Lot Help needed moving from lying on your back to sitting on the side of a flat bed without using bedrails?: A Lot Help needed moving to and from a bed to a chair (including a wheelchair)?: A Lot Help needed standing up from a chair using your arms (e.g., wheelchair or bedside chair)?: A Little Help needed to walk in hospital room?: A Little Help needed climbing 3-5 steps with a railing? : A Lot 6 Click Score: 14    End of Session Equipment Utilized During Treatment: Gait belt Activity Tolerance: Patient tolerated treatment well Patient left: in chair;with call bell/phone within reach;with chair  alarm set Nurse Communication: Mobility status PT Visit Diagnosis: Muscle weakness (generalized) (M62.81);Unsteadiness on feet (R26.81);Pain Pain - Right/Left: Left Pain - part of body: Hip     Time: 7846-9629 PT Time Calculation (min) (ACUTE ONLY): 31 min  Charges:    $Gait Training: 8-22 mins $Self Care/Home Management: 8-22 PT General Charges $$ ACUTE PT VISIT: 1 Visit                     Blanchard Kelch PT Acute Rehabilitation Services Office [phone] Weekend pager-[phone]    Rada Hay 05/01/2023, 1:36 PM

## 2023-05-01 NOTE — Progress Notes (Signed)
 Occupational Therapy Treatment Patient Details Name: Jillian [name] MRN: [id number] DOB: 03-Oct-1945 Today's Date: 05/01/2023   History of present illness 78 YO female S/P LTHA- DA on 04/28/23. PMH: HTN,DM,gout,CAD, exploratory lap   OT comments  Patient progressing slowly and showed improved recall of use of adaptive equipment as well as initiating correct hand placement for sit to stand without cues, compared to previous session.  Patient remains limited by post-op LT hip pain with any movement, mild cognitive deficits which may involve her pain meds, generalized weakness and decreased activity tolerance along with deficits noted below. Pt continues to demonstrate good rehab potential and would benefit from continued skilled OT to increase safety and independence with ADLs and functional transfers to allow pt to return home safely and reduce caregiver burden and fall risk.       If plan is discharge home, recommend the following:  A lot of help with walking and/or transfers;A lot of help with bathing/dressing/bathroom;Assistance with cooking/housework;Direct supervision/assist for medications management;Direct supervision/assist for financial management;Assist for transportation;Help with stairs or ramp for entrance   Equipment Recommendations   (BSC and RW now in room. Pt has a reacher and sock aid at home)    Recommendations for Other Services      Precautions / Restrictions Precautions Precautions: Fall Restrictions Weight Bearing Restrictions Per Provider Order: No LLE Weight Bearing Per Provider Order: Weight bearing as tolerated       Mobility Bed Mobility Overal bed mobility: Needs Assistance Bed Mobility: Supine to Sit     Supine to sit: HOB elevated, Used rails, Min assist     General bed mobility comments: PT reports that at home she does not sleep in a bed but rather in her lift recliner.    Transfers Overall transfer level: Needs assistance Equipment used:  Rolling walker (2 wheels) Transfers: Sit to/from Stand Sit to Stand: Min assist     Step pivot transfers: Min assist, From elevated surface     General transfer comment: multimodal cues to stand from EOB, cues for left leg position, required step by step slow cues to sequence descent to recliner with need of Min As to control eccentric movement.     Balance Overall balance assessment: Needs assistance Sitting-balance support: Feet supported, Bilateral upper extremity supported Sitting balance-Leahy Scale: Good     Standing balance support: During functional activity, Bilateral upper extremity supported, Reliant on assistive device for balance Standing balance-Leahy Scale: Poor                             ADL either performed or assessed with clinical judgement   ADL                                         General ADL Comments:  Grooming: Oral care;setup/safety;in chair. Pt shown how to position RW anterior to sink for safety, but pt in too much pain to demonstrate back once transferred to recliner and was setup for oral care at chair level.  Upper Body Bathing: Set up;Sitting Lower Body Bathing: Moderate assistance;Sitting/lateral leans;Sit to/from stand Lower Body Bathing Details (indicate cue type and reason): recommend use of LH sponge while seated. Recommend a solid shower chair as pt reports their's at home is "too small". Pt advised to dry off ad lib before mobilizing out of shower.  Upper Body Dressing :  Set up;Sitting Lower Body Dressing:  Lower Body Dressing Details (indicate cue type and reason): demo use of reacher + sock aid for LB dressing, pt return demo with adequate technique to doff socks but with need of cues and increased time.  Pt then reported that her husband can indeed assist with LB dressing and pt asked not to continue with adaptive equipment training. , Pt reported that she plans on wearing overhead dresses and no underwear. Pt  shown how to use reacher to don underwear over post-op LE first for ease. Pt cautioned to sit for all LE dressing/undressing and even UE dressing in order to avoid fall.   Shower Transfer: Pt reports plan to sponge bathe at sink to start.  Toilet Transfer Details (indicate cue type and reason): Pt educated on positioning of BSC over toilet or by bed at night. Indicated use of angling BSC to support prevent  hip pain.   Toileting- Clothing Manipulation and Hygiene: Mod As based on general assessment. Pt still on pure wick.  Functional mobility during ADLs: Min As and limited; Cueing for safety;Cueing for sequencing;Rolling walker General ADL Comments: patient requiring increased assistance for self care tasks due to L LE pain impacting activity tolerance, safety awareness, overall balance      IADLS: Pt educated on RW safety in kitchen including how to safely approach cabinets, having a chair or bar stool in kitchen to avoid prolonged standing, and having family bring most used items out to counter top height for increased accesibility.  Recommended family support as possible.      Extremity/Trunk Assessment Upper Extremity Assessment Upper Extremity Assessment: Overall WFL for tasks assessed   Lower Extremity Assessment Lower Extremity Assessment: Defer to PT evaluation LLE Deficits / Details: requires support to move the leg to the bed edge and to lift for removal of pillow.   Cervical / Trunk Assessment Cervical / Trunk Assessment: Other exceptions Cervical / Trunk Exceptions: forward flexed runk in sanding    Vision Baseline Vision/History: 0 No visual deficits Ability to See in Adequate Light: 0 Adequate     Perception     Praxis     Communication Communication Communication: No apparent difficulties   Cognition Arousal: Alert Behavior During Therapy: Anxious Cognition: Cognition impaired (suspect due to meds)     Awareness: Online awareness intact, Intellectual awareness  intact   Attention impairment (select first level of impairment): Sustained attention Executive functioning impairment (select all impairments): Reasoning, Problem solving OT - Cognition Comments: Tangential and easily distracted. Pt reports this is from the pain meds. Needs frequent redirection and cues for attention to taskand topic.                 Following commands: Impaired Following commands impaired: Only follows one step commands consistently      Cueing   Cueing Techniques: Verbal cues, Visual cues  Exercises      Shoulder Instructions       General Comments      Pertinent Vitals/ Pain       Pain Assessment Pain Assessment: 0-10 Pain Score: 8  Pain Location: left hip with any movement Pain Descriptors / Indicators: Aching, Grimacing, Discomfort, Heaviness Pain Intervention(s): Premedicated before session, Repositioned, Limited activity within patient's tolerance, Monitored during session, Ice applied, Utilized relaxation techniques  Home Living  Prior Functioning/Environment              Frequency  Min 2X/week        Progress Toward Goals  OT Goals(current goals can now be found in the care plan section)  Progress towards OT goals: Progressing toward goals  Acute Rehab OT Goals Patient Stated Goal: Decreased pain OT Goal Formulation: With patient Time For Goal Achievement: 05/14/23 Potential to Achieve Goals: Good  Plan      Co-evaluation                 AM-PAC OT "6 Clicks" Daily Activity     Outcome Measure   Help from another person eating meals?: None Help from another person taking care of personal grooming?: A Little Help from another person toileting, which includes using toliet, bedpan, or urinal?: A Little Help from another person bathing (including washing, rinsing, drying)?: A Lot Help from another person to put on and taking off regular upper body clothing?:  A Little Help from another person to put on and taking off regular lower body clothing?: A Lot 6 Click Score: 17    End of Session Equipment Utilized During Treatment: Gait belt;Rolling walker (2 wheels)  OT Visit Diagnosis: Unsteadiness on feet (R26.81);Pain Pain - Right/Left: Left Pain - part of body: Hip   Activity Tolerance Patient limited by pain   Patient Left in chair;with call bell/phone within reach;with chair alarm set   Nurse Communication Mobility status;Patient requests pain meds;Weight bearing status        Time: 0828-0907 OT Time Calculation (min): 39 min  Charges: OT General Charges $OT Visit: 1 Visit OT Treatments $Self Care/Home Management : 23-37 mins $Therapeutic Activity: 8-22 mins  Victorino Dike, OT Acute Rehab Services Office: [phone] 05/01/2023   Theodoro Clock 05/01/2023, 9:16 AM

## 2023-05-01 NOTE — Progress Notes (Signed)
 Physical Therapy Treatment Patient Details Name: Jillian [name] MRN: [id number] DOB: [birth year] Today's Date: 05/01/2023   History of Present Illness 78 YO female S/P LTHA- DA on 04/28/23. PMH: HTN,DM,gout,CAD, exploratory lap    PT Comments  Patient making small  improvements in mobility. Continues to get very  fatigued with short distance ambulation. Improved safety with sit to stand at Rw.  Encouraged patient to call staff for toileting and not use Purewick. Patient has not met goals for safe Dc due to patient will have no support  at home as spouse unable to provide. Anticipate progress through today and plan DC tomorrow. RN aware.    If plan is discharge home, recommend the following: Assist for transportation;Help with stairs or ramp for entrance;A little help with walking and/or transfers;A little help with bathing/dressing/bathroom   Can travel by private vehicle        Equipment Recommendations  Rolling walker (2 wheels);BSC/3in1    Recommendations for Other Services       Precautions / Restrictions Precautions Precautions: Fall Restrictions LLE Weight Bearing Per Provider Order: Weight bearing as tolerated     Mobility  Bed Mobility               General bed mobility comments: in recliner    Transfers Overall transfer level: Needs assistance Equipment used: Rolling walker (2 wheels) Transfers: Sit to/from Stand Sit to Stand: Min assist           General transfer comment: multimodal cues to stand from recliner, cues for left leg position, required step by step slow cues to sequence descent to recliner with need of Min As to control eccentric movement.    Ambulation/Gait Ambulation/Gait assistance: Min assist Gait Distance (Feet): 40 Feet Assistive device: Rolling walker (2 wheels) Gait Pattern/deviations: Step-to pattern, Antalgic, Decreased step length - left, Decreased stance time - left Gait velocity: dec     General Gait Details: slow to  take steps, trunk flexed. pt. reports did not stand erect PTA, gait is very slow, very fatigued after distance   Stairs             Wheelchair Mobility     Tilt Bed    Modified Rankin (Stroke Patients Only)       Balance Overall balance assessment: Needs assistance Sitting-balance support: Feet supported, Bilateral upper extremity supported Sitting balance-Leahy Scale: Good     Standing balance support: During functional activity, Bilateral upper extremity supported, Reliant on assistive device for balance Standing balance-Leahy Scale: Poor                              Communication    Cognition Arousal: Alert Behavior During Therapy: WFL for tasks assessed/performed                                    Cueing    Exercises      General Comments        Pertinent Vitals/Pain Pain Assessment Pain Score: 8  Pain Location: left hip with any movement Pain Descriptors / Indicators: Aching, Grimacing, Discomfort, Heaviness Pain Intervention(s): Monitored during session, Premedicated before session, Ice applied    Home Living                          Prior Function  PT Goals (current goals can now be found in the care plan section) Progress towards PT goals: Progressing toward goals    Frequency    7X/week      PT Plan      Co-evaluation              AM-PAC PT "6 Clicks" Mobility   Outcome Measure  Help needed turning from your back to your side while in a flat bed without using bedrails?: A Lot Help needed moving from lying on your back to sitting on the side of a flat bed without using bedrails?: A Lot Help needed moving to and from a bed to a chair (including a wheelchair)?: A Lot Help needed standing up from a chair using your arms (e.g., wheelchair or bedside chair)?: A Little Help needed to walk in hospital room?: A Little Help needed climbing 3-5 steps with a railing? : A Lot 6 Click  Score: 14    End of Session Equipment Utilized During Treatment: Gait belt Activity Tolerance: Patient tolerated treatment well Patient left: in chair;with call bell/phone within reach;with chair alarm set   PT Visit Diagnosis: Muscle weakness (generalized) (M62.81);Unsteadiness on feet (R26.81);Pain Pain - Right/Left: Left Pain - part of body: Hip     Time: 4098-1191 PT Time Calculation (min) (ACUTE ONLY): 49 min  Charges:    $Gait Training: 23-37 mins $Self Care/Home Management: 8-22 PT General Charges $$ ACUTE PT VISIT: 1 Visit                     Blanchard Kelch PT Acute Rehabilitation Services Office [phone] Weekend [phone]    Rada Hay 05/01/2023, 1:26 PM

## 2023-05-01 NOTE — Progress Notes (Signed)
   Subjective: 3 Days Post-Op Procedure(s) (LRB): LEFT TOTAL HIP ARTHROPLASTY ANTERIOR APPROACH (Left) Patient seen in rounds by Dr. Lequita Halt. Patient reports pain as moderate.  Slowly improving. Patient is well, and has had no acute complaints or problems. No issues overnight.  Objective: Vital signs in last 24 hours: Temp:  [97.4 F (36.3 C)-98.6 F (37 C)] 97.4 F (36.3 C) (03/06 0554) Pulse Rate:  [97-104] 97 (03/06 0554) Resp:  [16-17] 16 (03/06 0554) BP: (115-152)/(62-84) 152/74 (03/06 0554) SpO2:  [97 %-100 %] 97 % (03/06 0554)  Intake/Output from previous day:  Intake/Output Summary (Last 24 hours) at 05/01/2023 0739 Last data filed at 05/01/2023 0606 Gross per 24 hour  Intake 691.25 ml  Output 250 ml  Net 441.25 ml    Intake/Output this shift: No intake/output data recorded.  Labs: Recent Labs    04/29/23 0343 04/30/23 0345  HGB 11.4* 10.9*   Recent Labs    04/29/23 0343 04/30/23 0345  WBC 13.6* 17.0*  RBC 3.96 3.76*  HCT 36.5 35.1*  PLT 266 252   Recent Labs    04/29/23 0343  NA 136  K 4.6  CL 104  CO2 22  BUN 15  CREATININE 0.95  GLUCOSE 159*  CALCIUM 9.3   No results for input(s): "LABPT", "INR" in the last 72 hours.  Exam: General - Patient is Alert and Oriented Extremity - Neurologically intact Neurovascular intact Sensation intact distally Dorsiflexion/Plantar flexion intact Dressing/Incision - clean, dry, no drainage Motor Function - intact, moving foot and toes well on exam.  Past Medical History:  Diagnosis Date   Allergy    Anxiety    Bowel obstruction (HCC)    Cancer (HCC)    skin cancer had Mohs Surgery   Cataract    Coronary artery disease    Diabetes mellitus without complication (HCC)    Diverticulitis    GERD (gastroesophageal reflux disease)    Gout    H/O unstable angina    Headache    High cholesterol    History of kidney stones    Hypertension    Hypothyroidism    IBS (irritable bowel syndrome)    Neck  pain    PONV (postoperative nausea and vomiting)    Renovascular hypertension    Spondylosis of lumbar spine     Assessment/Plan: 3 Days Post-Op Procedure(s) (LRB): LEFT TOTAL HIP ARTHROPLASTY ANTERIOR APPROACH (Left) Principal Problem:   OA (osteoarthritis) of hip Active Problems:   Primary osteoarthritis of left hip  Estimated body mass index is 32.8 kg/m as calculated from the following:   Height as of this encounter: 5\' 3"  (1.6 m).   Weight as of this encounter: 84 kg.  DVT Prophylaxis - Aspirin Weight-bearing as tolerated.  Slow progression with physical therapy. Will continue with physical therapy while admitted. Hopeful to discharge home today pending progress.  Alfonzo Feller, PA-C Orthopedic Surgery 05/01/2023, 7:39 AM

## 2023-05-01 NOTE — Plan of Care (Signed)

## 2023-05-02 ENCOUNTER — Other Ambulatory Visit (HOSPITAL_COMMUNITY): Payer: Self-pay

## 2023-05-02 DIAGNOSIS — E7800 Pure hypercholesterolemia, unspecified: Secondary | ICD-10-CM | POA: Diagnosis not present

## 2023-05-02 DIAGNOSIS — E119 Type 2 diabetes mellitus without complications: Secondary | ICD-10-CM | POA: Diagnosis not present

## 2023-05-02 DIAGNOSIS — I2510 Atherosclerotic heart disease of native coronary artery without angina pectoris: Secondary | ICD-10-CM | POA: Diagnosis not present

## 2023-05-02 DIAGNOSIS — Z7984 Long term (current) use of oral hypoglycemic drugs: Secondary | ICD-10-CM | POA: Diagnosis not present

## 2023-05-02 DIAGNOSIS — Z96642 Presence of left artificial hip joint: Secondary | ICD-10-CM | POA: Diagnosis not present

## 2023-05-02 DIAGNOSIS — E039 Hypothyroidism, unspecified: Secondary | ICD-10-CM | POA: Diagnosis not present

## 2023-05-02 DIAGNOSIS — K589 Irritable bowel syndrome without diarrhea: Secondary | ICD-10-CM | POA: Diagnosis not present

## 2023-05-02 DIAGNOSIS — Z87442 Personal history of urinary calculi: Secondary | ICD-10-CM | POA: Diagnosis not present

## 2023-05-02 DIAGNOSIS — K5792 Diverticulitis of intestine, part unspecified, without perforation or abscess without bleeding: Secondary | ICD-10-CM | POA: Diagnosis not present

## 2023-05-02 DIAGNOSIS — M542 Cervicalgia: Secondary | ICD-10-CM | POA: Diagnosis not present

## 2023-05-02 DIAGNOSIS — Z91040 Latex allergy status: Secondary | ICD-10-CM | POA: Diagnosis not present

## 2023-05-02 DIAGNOSIS — M1612 Unilateral primary osteoarthritis, left hip: Secondary | ICD-10-CM | POA: Diagnosis not present

## 2023-05-02 DIAGNOSIS — M47816 Spondylosis without myelopathy or radiculopathy, lumbar region: Secondary | ICD-10-CM | POA: Diagnosis not present

## 2023-05-02 DIAGNOSIS — Z85828 Personal history of other malignant neoplasm of skin: Secondary | ICD-10-CM | POA: Diagnosis not present

## 2023-05-02 DIAGNOSIS — I10 Essential (primary) hypertension: Secondary | ICD-10-CM | POA: Diagnosis not present

## 2023-05-02 DIAGNOSIS — M109 Gout, unspecified: Secondary | ICD-10-CM | POA: Diagnosis not present

## 2023-05-02 DIAGNOSIS — K219 Gastro-esophageal reflux disease without esophagitis: Secondary | ICD-10-CM | POA: Diagnosis not present

## 2023-05-02 NOTE — Progress Notes (Signed)
 Physical Therapy Treatment Patient Details Name: Jillian [name] MRN: [id number] DOB: [birth year] Today's Date: 05/02/2023   History of Present Illness 78 YO female S/P LTHA- DA on 04/28/23. PMH: HTN,DM,gout,CAD, exploratory lap    PT Comments  POD # 4 am session PT - Cognition Comments: AxO x 3 very pleasant Lady.  Admits, she does not do well with pain.  "I'm a wimp:, stated pt. Assisted with amb a limited distance due to pain.  General transfer comment: required increased time and effort to self rise from recliner grimacing in pain. General Gait Details: decreased amb distance due to increased c/o pain.  "Hurts more today", stated Pt. Pt only amb 22 feet.  Returned to recliner and performed a few TE's followed by ICE. Will see Pt again this afternoon to complete HEP Education.  Expect to D/C to home later today.     If plan is discharge home, recommend the following: Assist for transportation;Help with stairs or ramp for entrance;A little help with walking and/or transfers;A little help with bathing/dressing/bathroom   Can travel by private vehicle        Equipment Recommendations  Rolling walker (2 wheels);BSC/3in1    Recommendations for Other Services       Precautions / Restrictions Precautions Precautions: Fall Restrictions Weight Bearing Restrictions Per Provider Order: No LLE Weight Bearing Per Provider Order: Weight bearing as tolerated     Mobility  Bed Mobility               General bed mobility comments: OOB in recliner    Transfers Overall transfer level: Needs assistance Equipment used: Rolling walker (2 wheels) Transfers: Sit to/from Stand Sit to Stand: Contact guard assist, Supervision           General transfer comment: required increased time and effort to self rise from recliner grimacing in pain.    Ambulation/Gait Ambulation/Gait assistance: Min assist Gait Distance (Feet): 22 Feet Assistive device: Rolling walker (2 wheels) Gait  Pattern/deviations: Step-to pattern, Antalgic, Decreased step length - left, Decreased stance time - left Gait velocity: dec     General Gait Details: decreased amb distance due to increased c/o pain.  "Hurts more today", stated Pt.   Stairs             Wheelchair Mobility     Tilt Bed    Modified Rankin (Stroke Patients Only)       Balance                                            Communication    Cognition Arousal: Alert Behavior During Therapy: WFL for tasks assessed/performed   PT - Cognitive impairments: No apparent impairments                       PT - Cognition Comments: AxO x 3 very pleasant Lady.  Admits, she does not do well with pain.  "I'm a wimp:, stated pt.        Cueing    Exercises      General Comments        Pertinent Vitals/Pain Pain Assessment Pain Assessment: 0-10 Pain Score: 8  Pain Location: L hip Pain Descriptors / Indicators: Moaning, Grimacing, Operative site guarding Pain Intervention(s): Monitored during session, Premedicated before session, Repositioned, Patient requesting pain meds-RN notified    Home Living  Prior Function            PT Goals (current goals can now be found in the care plan section) Progress towards PT goals: Progressing toward goals    Frequency    7X/week      PT Plan      Co-evaluation              AM-PAC PT "6 Clicks" Mobility   Outcome Measure  Help needed turning from your back to your side while in a flat bed without using bedrails?: A Little Help needed moving from lying on your back to sitting on the side of a flat bed without using bedrails?: A Little Help needed moving to and from a bed to a chair (including a wheelchair)?: A Little Help needed standing up from a chair using your arms (e.g., wheelchair or bedside chair)?: A Little Help needed to walk in hospital room?: A Little Help needed climbing 3-5  steps with a railing? : A Lot 6 Click Score: 17    End of Session Equipment Utilized During Treatment: Gait belt Activity Tolerance: Patient limited by pain Patient left: in chair;with call bell/phone within reach Nurse Communication: Mobility status PT Visit Diagnosis: Muscle weakness (generalized) (M62.81);Unsteadiness on feet (R26.81);Pain Pain - Right/Left: Left Pain - part of body: Hip     Time: 1610-9604 PT Time Calculation (min) (ACUTE ONLY): 26 min  Charges:    $Gait Training: 8-22 mins $Therapeutic Activity: 8-22 mins PT General Charges $$ ACUTE PT VISIT: 1 Visit                     Felecia Shelling  PTA Acute  Rehabilitation Services Office M-F          [phone]

## 2023-05-02 NOTE — Progress Notes (Signed)
   Subjective: 4 Days Post-Op Procedure(s) (LRB): LEFT TOTAL HIP ARTHROPLASTY ANTERIOR APPROACH (Left) Patient seen in rounds by Dr. Lequita Halt. Patient reports pain as moderate.   Patient is well, and has had no acute complaints or problems  Objective: Vital signs in last 24 hours: Temp:  [97.6 F (36.4 C)-98.9 F (37.2 C)] 98.9 F (37.2 C) (03/07 0503) Pulse Rate:  [102] 102 (03/07 0503) Resp:  [16-18] 18 (03/07 0503) BP: (120-143)/(60-80) 134/80 (03/07 0503) SpO2:  [96 %] 96 % (03/07 0503)  Intake/Output from previous day:  Intake/Output Summary (Last 24 hours) at 05/02/2023 0809 Last data filed at 05/01/2023 1832 Gross per 24 hour  Intake 780 ml  Output --  Net 780 ml    Intake/Output this shift: No intake/output data recorded.  Labs: Recent Labs    04/30/23 0345  HGB 10.9*   Recent Labs    04/30/23 0345  WBC 17.0*  RBC 3.76*  HCT 35.1*  PLT 252   No results for input(s): "NA", "K", "CL", "CO2", "BUN", "CREATININE", "GLUCOSE", "CALCIUM" in the last 72 hours. No results for input(s): "LABPT", "INR" in the last 72 hours.  Exam: General - Patient is Alert and Oriented Extremity - Neurologically intact Neurovascular intact Sensation intact distally Dorsiflexion/Plantar flexion intact Dressing/Incision - clean, dry, no drainage Motor Function - intact, moving foot and toes well on exam.  Past Medical History:  Diagnosis Date   Allergy    Anxiety    Bowel obstruction (HCC)    Cancer (HCC)    skin cancer had Mohs Surgery   Cataract    Coronary artery disease    Diabetes mellitus without complication (HCC)    Diverticulitis    GERD (gastroesophageal reflux disease)    Gout    H/O unstable angina    Headache    High cholesterol    History of kidney stones    Hypertension    Hypothyroidism    IBS (irritable bowel syndrome)    Neck pain    PONV (postoperative nausea and vomiting)    Renovascular hypertension    Spondylosis of lumbar spine      Assessment/Plan: 4 Days Post-Op Procedure(s) (LRB): LEFT TOTAL HIP ARTHROPLASTY ANTERIOR APPROACH (Left) Principal Problem:   OA (osteoarthritis) of hip Active Problems:   Primary osteoarthritis of left hip  Estimated body mass index is 32.8 kg/m as calculated from the following:   Height as of this encounter: 5\' 3"  (1.6 m).   Weight as of this encounter: 84 kg.  DVT Prophylaxis - Aspirin Weight-bearing as tolerated.  Continue physical therapy this AM. Discharge home once cleared by physical therapy. Will do HEP once discharged. F/u in clinic in 2 weeks.  Alfonzo Feller, PA-C Orthopedic Surgery 05/02/2023, 8:09 AM

## 2023-05-02 NOTE — Progress Notes (Signed)
 Occupational Therapy Treatment Patient Details Name: Jillian [name] MRN: [id number] DOB: [birth year] Today's Date: 05/02/2023   History of present illness 78 YO female S/P LTHA- DA on 04/28/23. PMH: HTN,DM,gout,CAD, exploratory lap   OT comments  Patient progressing and showed improved ability to stand from recliner and BSC with just CGA, compared to previous session when pt required up to Mod Assist to stand. Patient remains limited by post-op pain, cognitive deficts with decreased safety awareness, generalized weakness and decreased activity tolerance along with deficits noted below. Pt continues to demonstrate good rehab potential and would benefit from continued skilled OT to increase safety and independence with ADLs and functional transfers to allow pt to return home safely and reduce caregiver burden and fall risk.  Home health PT and OT are recommended until pt can tolerate going to an outpatient PT clinic.        If plan is discharge home, recommend the following:  A lot of help with bathing/dressing/bathroom;Assistance with cooking/housework;Direct supervision/assist for medications management;Direct supervision/assist for financial management;Assist for transportation;Help with stairs or ramp for entrance;A little help with walking and/or transfers   Equipment Recommendations  None recommended by OT    Recommendations for Other Services      Precautions / Restrictions Precautions Precautions: Fall Restrictions Weight Bearing Restrictions Per Provider Order: No LLE Weight Bearing Per Provider Order: Weight bearing as tolerated       Mobility Bed Mobility               General bed mobility comments: in recliner    Transfers                         Balance Overall balance assessment: Needs assistance Sitting-balance support: Feet supported, Bilateral upper extremity supported Sitting balance-Leahy Scale: Good       Standing balance-Leahy Scale:  Poor                             ADL either performed or assessed with clinical judgement   ADL Overall ADL's : Needs assistance/impaired Eating/Feeding: Independent   Grooming: Standing;Oral care;Supervision/safety;Contact guard assist;Cueing for sequencing;Cueing for compensatory techniques;Cueing for safety Grooming Details (indicate cue type and reason): Pt stood from recliner, using anterior weight shift rocking to power up with only CGA needed for safety. Pt used RW to ambulate to bathroom with CGA and cues for step sequence. Remains stooped over which pt reports is baseline. After ~18' ambulation pt very winded and required seated rest break on [facility] for several minutes. Pt then stood from [facility] to sink by pulling up on sink with CGA. Pt cued for correct postiion of RW anterior to sink and pt rested foreams on sink to complete oral care with SBA. After another seated rest break on BSC, pt stood again with [facility] and ambulated back to recliner again for ~18' and winded. PT reported 9/10 pain after ambulation.               Lower Body Dressing Details (indicate cue type and reason): Pt now reports that her husband can assist with LBD and declined AE review. Toilet Transfer: Contact guard assist;BSC/3in1;Ambulation;Cueing for sequencing;Cueing for safety Toilet Transfer Details (indicate cue type and reason): Pt asked for review of positioning and uses of BSC> OT brough BSC into pt's view and pt attempted to stand without a RW nearby her and without assistnce. Pt re-educated on always having her RW  with her and to have close supervision before standing up.   Toileting - Clothing Manipulation Details (indicate cue type and reason): PT did not void while on BSC. USed it as a rest break.     Functional mobility during ADLs: Contact guard assist;Supervision/safety;Rolling walker (2 wheels);Cueing for sequencing;Cueing for safety      Extremity/Trunk Assessment Upper Extremity  Assessment Upper Extremity Assessment: Overall WFL for tasks assessed       Cervical / Trunk Assessment Cervical / Trunk Exceptions: forward flexed trunk in sanding    Vision Ability to See in Adequate Light: 0 Adequate Vision Assessment?: No apparent visual deficits   Perception     Praxis     Communication Communication Communication: No apparent difficulties   Cognition Arousal: Alert Behavior During Therapy: WFL for tasks assessed/performed Cognition: Cognition impaired     Awareness: Online awareness intact, Intellectual awareness intact Memory impairment (select all impairments): Short-term memory Attention impairment (select first level of impairment): Sustained attention Executive functioning impairment (select all impairments): Reasoning, Problem solving OT - Cognition Comments: Remains somewhat tangential but a bit better today.                 Following commands: Impaired Following commands impaired: Only follows one step commands consistently      Cueing   Cueing Techniques: Verbal cues, Visual cues  Exercises      Shoulder Instructions       General Comments      Pertinent Vitals/ Pain       Pain Assessment Pain Assessment: 0-10 Pain Score: 9  Pain Location: left hip with a,bulation. Pain Descriptors / Indicators: Moaning, Grimacing Pain Intervention(s): Patient requesting pain meds-RN notified, Limited activity within patient's tolerance, Monitored during session, Repositioned (Callbell used to requiest pain meds)  Home Living                                          Prior Functioning/Environment              Frequency  Min 2X/week        Progress Toward Goals  OT Goals(current goals can now be found in the care plan section)  Progress towards OT goals: Progressing toward goals  Acute Rehab OT Goals Patient Stated Goal: Go to rehab or at least get [facility] PT and OT. OT messaged pt's PA for updated d/c  planning. OT Goal Formulation: With patient Time For Goal Achievement: 05/14/23 Potential to Achieve Goals: Good  Plan      Co-evaluation                 AM-PAC OT "6 Clicks" Daily Activity     Outcome Measure   Help from another person eating meals?: None Help from another person taking care of personal grooming?: A Little Help from another person toileting, which includes using toliet, bedpan, or urinal?: A Little Help from another person bathing (including washing, rinsing, drying)?: A Little Help from another person to put on and taking off regular upper body clothing?: A Little Help from another person to put on and taking off regular lower body clothing?: A Lot 6 Click Score: 18    End of Session Equipment Utilized During Treatment: Gait belt;Rolling walker (2 wheels)  OT Visit Diagnosis: Unsteadiness on feet (R26.81);Pain Pain - Right/Left: Left Pain - part of body: Hip   Activity Tolerance Patient limited by  pain;Patient limited by fatigue   Patient Left in chair;with call bell/phone within reach;with chair alarm set   Nurse Communication Patient requests pain meds        Time: 8413-2440 OT Time Calculation (min): 28 min  Charges: OT General Charges $OT Visit: 1 Visit OT Treatments $Self Care/Home Management : 8-22 mins $Therapeutic Activity: 8-22 mins  Victorino Dike, OT Acute Rehab Services Office: [phone] 05/02/2023   Theodoro Clock 05/02/2023, 9:57 AM

## 2023-05-02 NOTE — Progress Notes (Signed)
 TOC meds from [facility] outpatient pharmacy delivered by this RN

## 2023-05-02 NOTE — Progress Notes (Signed)
 Physical Therapy Treatment Patient Details Name: Jillian [name] MRN: [id number] DOB: Jan 16, 1946 Today's Date: 05/02/2023   History of Present Illness 78 YO female S/P LTHA- DA on 04/28/23. PMH: HTN,DM,gout,CAD, exploratory lap    PT Comments  POD # 1 pm session Pt AxO x 3 pleasant but does not deal with pain well.  "I don't do pain", stated pt.  Spouse present during session.  Educated and performed all THR TE's following HEP handout.  Required some rest breaks between and increased instruction to fully comprehend. Amb a limited distance in room as pt was fatigued from her exercises.  Educated "walking" in moderation with gradual increase.   Walker and BSC was delivered earlier.  Addressed all mobility questions, discussed appropriate activity, educated on use of ICE.  Pt ready for D/C to home.    If plan is discharge home, recommend the following: Assist for transportation;Help with stairs or ramp for entrance;A little help with walking and/or transfers;A little help with bathing/dressing/bathroom   Can travel by private vehicle        Equipment Recommendations  Rolling walker (2 wheels);BSC/3in1    Recommendations for Other Services       Precautions / Restrictions Precautions Precautions: Fall Restrictions Weight Bearing Restrictions Per Provider Order: No LLE Weight Bearing Per Provider Order: Weight bearing as tolerated     Mobility  Bed Mobility               General bed mobility comments: OOB in recliner    Transfers Overall transfer level: Needs assistance Equipment used: Rolling walker (2 wheels) Transfers: Sit to/from Stand Sit to Stand: Contact guard assist, Supervision           General transfer comment: required increased time and effort to self rise from recliner grimacing in pain.    Ambulation/Gait Ambulation/Gait assistance: Min assist Gait Distance (Feet): 10 Feet Assistive device: Rolling walker (2 wheels) Gait Pattern/deviations:  Step-to pattern, Antalgic, Decreased step length - left, Decreased stance time - left Gait velocity: dec     General Gait Details: decreased amb distance around room due to increased c/o pain.  "Hurts more today", stated Pt.   Stairs             Wheelchair Mobility     Tilt Bed    Modified Rankin (Stroke Patients Only)       Balance                                            Communication    Cognition Arousal: Alert Behavior During Therapy: WFL for tasks assessed/performed   PT - Cognitive impairments: No apparent impairments                       PT - Cognition Comments: AxO x 3 very pleasant Lady.  Admits, she does not do well with pain.  "I'm a wimp:, stated pt. Spouse present during session        Cueing    Exercises  Total Hip Replacement TE's following HEP Handout 10 reps ankle pumps 05 reps knee presses 05 reps heel slides 05 reps SAQ's 05 reps ABD 05 reps LAQ's 05 reps all standing TE's Instructed how to use a belt loop to assist  Followed by ICE     General Comments        Pertinent Vitals/Pain Pain  Assessment Pain Assessment: Faces Pain Score: 8  Faces Pain Scale: Hurts little more Pain Location: L hip Pain Descriptors / Indicators: Moaning, Grimacing, Operative site guarding Pain Intervention(s): Monitored during session, Premedicated before session, Repositioned, Ice applied    Home Living                          Prior Function            PT Goals (current goals can now be found in the care plan section) Progress towards PT goals: Progressing toward goals    Frequency    7X/week      PT Plan      Co-evaluation              AM-PAC PT "6 Clicks" Mobility   Outcome Measure  Help needed turning from your back to your side while in a flat bed without using bedrails?: A Little Help needed moving from lying on your back to sitting on the side of a flat bed without using  bedrails?: A Little Help needed moving to and from a bed to a chair (including a wheelchair)?: A Little Help needed standing up from a chair using your arms (e.g., wheelchair or bedside chair)?: A Little Help needed to walk in hospital room?: A Little Help needed climbing 3-5 steps with a railing? : A Lot 6 Click Score: 17    End of Session Equipment Utilized During Treatment: Gait belt Activity Tolerance: Patient limited by fatigue Patient left: in chair;with call bell/phone within reach Nurse Communication: Mobility status PT Visit Diagnosis: Muscle weakness (generalized) (M62.81);Unsteadiness on feet (R26.81);Pain Pain - Right/Left: Left Pain - part of body: Hip     Time: 1440-1505 PT Time Calculation (min) (ACUTE ONLY): 25 min  Charges:    $Gait Training: 8-22 mins $Therapeutic Exercise: 8-22 mins PT General Charges $$ ACUTE PT VISIT: 1 Visit                     Felecia Shelling  PTA Acute  Rehabilitation Services Office M-F          [phone]

## 2023-05-02 NOTE — Plan of Care (Signed)

## 2023-05-02 NOTE — Plan of Care (Signed)
 Patient discharged home via private vehicle with husband, following PT and delivery of medications. AVS and discharge education provided and patient verbalizes understanding. Haydee Salter, RN 05/02/23 4:20 PM

## 2023-05-05 NOTE — Discharge Summary (Signed)
 Physician Discharge Summary   Patient ID: Jillian [name] MRN: [id number] DOB/AGE: [birth year] 78 y.o.  Admit date: 04/28/2023 Discharge date: 05/02/2023  Primary Diagnosis: Osteoarthritis of the left hip   Admission Diagnoses:  Past Medical History:  Diagnosis Date   Allergy    Anxiety    Bowel obstruction (HCC)    Cancer (HCC)    skin cancer had Mohs Surgery   Cataract    Coronary artery disease    Diabetes mellitus without complication (HCC)    Diverticulitis    GERD (gastroesophageal reflux disease)    Gout    H/O unstable angina    Headache    High cholesterol    History of kidney stones    Hypertension    Hypothyroidism    IBS (irritable bowel syndrome)    Neck pain    PONV (postoperative nausea and vomiting)    Renovascular hypertension    Spondylosis of lumbar spine    Discharge Diagnoses:   Principal Problem:   OA (osteoarthritis) of hip Active Problems:   Primary osteoarthritis of left hip  Estimated body mass index is 32.8 kg/m as calculated from the following:   Height as of this encounter: 5\' 3"  (1.6 m).   Weight as of this encounter: 84 kg.  Procedure:  Procedure(s) (LRB): LEFT TOTAL HIP ARTHROPLASTY ANTERIOR APPROACH (Left)   Consults: None  HPI: Jillian [name] is a 78 y.o. female who has advanced end-stage arthritis of their Left  hip with progressively worsening pain and  dysfunction.The patient has failed nonoperative management and presents for total hip arthroplasty.     Laboratory Data: Admission on 04/28/2023, Discharged on 05/02/2023  Component Date Value Ref Range Status   Glucose-Capillary 04/28/2023 82  70 - 99 mg/dL Final   Glucose reference range applies only to samples taken after fasting for at least 8 hours.   Glucose-Capillary 04/28/2023 93  70 - 99 mg/dL Final   Glucose reference range applies only to samples taken after fasting for at least 8 hours.   WBC 04/29/2023 13.6 (H)  4.0 - 10.5 K/uL Final   RBC  04/29/2023 3.96  3.87 - 5.11 MIL/uL Final   Hemoglobin 04/29/2023 11.4 (L)  12.0 - 15.0 g/dL Final   HCT 40/98/1191 36.5  36.0 - 46.0 % Final   MCV 04/29/2023 92.2  80.0 - 100.0 fL Final   MCH 04/29/2023 28.8  26.0 - 34.0 pg Final   MCHC 04/29/2023 31.2  30.0 - 36.0 g/dL Final   RDW 47/82/9562 13.5  11.5 - 15.5 % Final   Platelets 04/29/2023 266  150 - 400 K/uL Final   nRBC 04/29/2023 0.0  0.0 - 0.2 % Final   Performed at [facility], [address] [place], [place] [address]   Sodium 04/29/2023 136  135 - 145 mmol/L Final   Potassium 04/29/2023 4.6  3.5 - 5.1 mmol/L Final   Chloride 04/29/2023 104  98 - 111 mmol/L Final   CO2 04/29/2023 22  22 - 32 mmol/L Final   Glucose, Bld 04/29/2023 159 (H)  70 - 99 mg/dL Final   Glucose reference range applies only to samples taken after fasting for at least 8 hours.   BUN 04/29/2023 15  8 - 23 mg/dL Final   Creatinine, Ser 04/29/2023 0.95  0.44 - 1.00 mg/dL Final   Calcium 57/84/6962 9.3  8.9 - 10.3 mg/dL Final   GFR, Estimated 04/29/2023 >60  >60 mL/min Final   Comment: (NOTE) Calculated using  the CKD-EPI Creatinine Equation (2021)    Anion gap 04/29/2023 10  5 - 15 Final   Performed at [facility], [address], [place], [place] [address]   WBC 04/30/2023 17.0 (H)  4.0 - 10.5 K/uL Final   RBC 04/30/2023 3.76 (L)  3.87 - 5.11 MIL/uL Final   Hemoglobin 04/30/2023 10.9 (L)  12.0 - 15.0 g/dL Final   HCT 60/45/4098 35.1 (L)  36.0 - 46.0 % Final   MCV 04/30/2023 93.4  80.0 - 100.0 fL Final   MCH 04/30/2023 29.0  26.0 - 34.0 pg Final   MCHC 04/30/2023 31.1  30.0 - 36.0 g/dL Final   RDW 11/91/4782 14.1  11.5 - 15.5 % Final   Platelets 04/30/2023 252  150 - 400 K/uL Final   nRBC 04/30/2023 0.0  0.0 - 0.2 % Final   Performed at [facility], [address], [place], [place] [address]  Hospital Outpatient Visit on 04/21/2023  Component Date Value Ref Range Status   MRSA, PCR 04/21/2023  NEGATIVE  NEGATIVE Final   Staphylococcus aureus 04/21/2023 NEGATIVE  NEGATIVE Final   Comment: (NOTE) The Xpert SA Assay (FDA approved for NASAL specimens in patients 29 years of age and older), is one component of a comprehensive surveillance program. It is not intended to diagnose infection nor to guide or monitor treatment. Performed at [facility], [address], [place], [place] [address]    Sodium 04/21/2023 138  135 - 145 mmol/L Final   Potassium 04/21/2023 4.2  3.5 - 5.1 mmol/L Final   Chloride 04/21/2023 107  98 - 111 mmol/L Final   CO2 04/21/2023 21 (L)  22 - 32 mmol/L Final   Glucose, Bld 04/21/2023 100 (H)  70 - 99 mg/dL Final   Glucose reference range applies only to samples taken after fasting for at least 8 hours.   BUN 04/21/2023 18  8 - 23 mg/dL Final   Creatinine, Ser 04/21/2023 0.80  0.44 - 1.00 mg/dL Final   Calcium 78/46/9629 10.2  8.9 - 10.3 mg/dL Final   GFR, Estimated 04/21/2023 >60  >60 mL/min Final   Comment: (NOTE) Calculated using the CKD-EPI Creatinine Equation (2021)    Anion gap 04/21/2023 10  5 - 15 Final   Performed at [facility], [address], [place], [place] [address]   WBC 04/21/2023 8.2  4.0 - 10.5 K/uL Final   RBC 04/21/2023 4.63  3.87 - 5.11 MIL/uL Final   Hemoglobin 04/21/2023 13.3  12.0 - 15.0 g/dL Final   HCT 32/44/0102 42.6  36.0 - 46.0 % Final   MCV 04/21/2023 92.0  80.0 - 100.0 fL Final   MCH 04/21/2023 28.7  26.0 - 34.0 pg Final   MCHC 04/21/2023 31.2  30.0 - 36.0 g/dL Final   RDW 72/53/6644 13.9  11.5 - 15.5 % Final   Platelets 04/21/2023 323  150 - 400 K/uL Final   nRBC 04/21/2023 0.0  0.0 - 0.2 % Final   Performed at [facility], [address], [place], [place] [address]   ABO/RH(D) 04/21/2023 A POS   Final   Antibody Screen 04/21/2023 NEG   Final   Sample Expiration 04/21/2023 05/01/2023,2359   Final   Extend sample reason 04/21/2023    Final                    Value:NO TRANSFUSIONS OR PREGNANCY IN THE PAST 3 MONTHS Performed at [facility], [address], [place], [place] [address]    Glucose-Capillary 04/21/2023 91  70 - 99 mg/dL Final   Glucose reference range applies only to samples taken after fasting for at least 8 hours.     X-Rays:X-ray pelvis complete Result Date: 04/28/2023 CLINICAL DATA:  Status post hip replacement. EXAM: PELVIS - 1-2 VIEW COMPARISON:  None Available. FINDINGS: Left hip arthroplasty in expected alignment. No periprosthetic lucency or fracture. Recent postsurgical change includes air and edema in the soft tissues. Right hip arthropathy. IMPRESSION: Left hip arthroplasty without immediate postoperative complication. Electronically Signed   By: Narda Rutherford M.D.   On: 04/28/2023 17:22   DG HIP UNILAT WITH PELVIS 1V LEFT Result Date: 04/28/2023 CLINICAL DATA:  Elective surgery, left hip arthroplasty. EXAM: DG HIP (WITH OR WITHOUT PELVIS) 1V*L* COMPARISON:  None Available. FINDINGS: Four fluoroscopic spot views of the pelvis and left hip obtained in the operating room. Images during hip arthroplasty. Fluoroscopy time 9 seconds. Dose 1.2 mGy. IMPRESSION: Intraoperative fluoroscopy during left hip arthroplasty. Electronically Signed   By: Narda Rutherford M.D.   On: 04/28/2023 15:55   DG C-Arm 1-60 Min-No Report Result Date: 04/28/2023 Fluoroscopy was utilized by the requesting physician.  No radiographic interpretation.   DG C-Arm 1-60 Min-No Report Result Date: 04/28/2023 Fluoroscopy was utilized by the requesting physician.  No radiographic interpretation.   MM 3D SCREENING MAMMOGRAM BILATERAL BREAST Result Date: 04/17/2023 CLINICAL DATA:  Screening. EXAM: DIGITAL SCREENING BILATERAL MAMMOGRAM WITH TOMOSYNTHESIS AND CAD TECHNIQUE: Bilateral screening digital craniocaudal and mediolateral oblique mammograms were obtained. Bilateral screening digital breast tomosynthesis was performed. The images were  evaluated with computer-aided detection. COMPARISON:  Previous exam(s). ACR Breast Density Category b: There are scattered areas of fibroglandular density. FINDINGS: There are no findings suspicious for malignancy. IMPRESSION: No mammographic evidence of malignancy. A result letter of this screening mammogram will be mailed directly to the patient. RECOMMENDATION: Screening mammogram in one year. (Code:SM-B-01Y) BI-RADS CATEGORY  1: Negative. Electronically Signed   By: Amie Portland M.D.   On: 04/17/2023 11:40    EKG: Orders placed or performed in visit on 11/06/22   EKG 12-Lead     Hospital Course: Jillian [name] is a 78 y.o. who was admitted to [facility]. They were brought to the operating room on 04/28/2023 and underwent Procedure(s): LEFT TOTAL HIP ARTHROPLASTY ANTERIOR APPROACH.  Patient tolerated the procedure well and was later transferred to the recovery room and then to the orthopaedic floor for postoperative care. They were given PO and IV analgesics for pain control following their surgery. They were given 24 hours of postoperative antibiotics of  Anti-infectives (From admission, onward)    Start     Dose/Rate Route Frequency Ordered Stop   04/28/23 2000  ceFAZolin (ANCEF) IVPB 2g/100 mL premix        2 g 200 mL/hr over 30 Minutes Intravenous Every 6 hours 04/28/23 1802 04/29/23 0242   04/28/23 1130  ceFAZolin (ANCEF) IVPB 2g/100 mL premix  Status:  Discontinued        2 g 200 mL/hr over 30 Minutes Intravenous On call to O.R. 04/28/23 1125 04/28/23 1749     and started on DVT prophylaxis in the form of Aspirin.   PT and OT were ordered for total joint protocol. Discharge planning consulted to help with post-op disposition and equipment needs. Patient had a fair night on the evening of surgery. They started to get up OOB with physical therapy on POD #1.  Continued to work with physical therapy into POD #4. Patient was seen during rounds on day four and was ready to go  home pending progress with physical therapy. Patient worked with physical therapy for a total of 8 sessions and was meeting their goals. Dressing was changed and the incision was C/D/I.  They were discharged home later that day in stable condition.  Diet: Regular diet Activity: WBAT Follow-up: in 2 weeks Disposition: Home Discharged Condition: stable   Discharge Instructions     Call MD / Call 911   Complete by: As directed    If you experience chest pain or shortness of breath, CALL 911 and be transported to the hospital emergency room.  If you develope a fever above 101 F, pus (white drainage) or increased drainage or redness at the wound, or calf pain, call your surgeon's office.   Change dressing   Complete by: As directed    You have an adhesive waterproof bandage over the incision. Leave this in place until your first follow-up appointment. Once you remove this you will not need to place another bandage.   Constipation Prevention   Complete by: As directed    Drink plenty of fluids.  Prune juice may be helpful.  You may use a stool softener, such as Colace (over the counter) 100 mg twice a day.  Use MiraLax (over the counter) for constipation as needed.   Diet - low sodium heart healthy   Complete by: As directed    Do not sit on low chairs, stoools or toilet seats, as it may be difficult to get up from low surfaces   Complete by: As directed    Driving restrictions   Complete by: As directed    No driving for two weeks   Post-operative opioid taper instructions:   Complete by: As directed    POST-OPERATIVE OPIOID TAPER INSTRUCTIONS: It is important to wean off of your opioid medication as soon as possible. If you do not need pain medication after your surgery it is ok to stop day one. Opioids include: Codeine, Hydrocodone(Norco, Vicodin), Oxycodone(Percocet, oxycontin) and hydromorphone amongst others.  Long term and even short term use of opiods can cause: Increased pain  response Dependence Constipation Depression Respiratory depression And more.  Withdrawal symptoms can include Flu like symptoms Nausea, vomiting And more Techniques to manage these symptoms Hydrate well Eat regular healthy meals Stay active Use relaxation techniques(deep breathing, meditating, yoga) Do Not substitute Alcohol to help with tapering If you have been on opioids for less than two weeks and do not have pain than it is ok to stop all together.  Plan to wean off of opioids This plan should start within one week post op of your joint replacement. Maintain the same interval or time between taking each dose and first decrease the dose.  Cut the total daily intake of opioids by one tablet each day Next start to increase the time between doses. The last dose that should be eliminated is the evening dose.      TED hose   Complete by: As directed    Use stockings (TED hose) for three weeks on both leg(s).  You may remove them at night for sleeping.   Weight bearing as tolerated   Complete by: As directed       Allergies as of 05/02/2023       Reactions   Hydrocodone-acetaminophen Itching   Nortriptyline Anxiety, Palpitations   Oxycodone Itching   Metronidazole Nausea And  Vomiting   Flu like symptoms   Sulfamethoxazole Other (See Comments)   Flu-like symptoms.   Amoxicillin    Facial Numbness   Chlorhexidine Itching   Redness Burning   Luminal [phenobarbital] Other (See Comments)   Blisters   Mobic [meloxicam]    Onion    Raw onion causes headaches    Penicillins    Unknown reaction Tolerated Cephalosporin Date: 04/29/23.   Allopurinol Rash   Ciprofloxacin Rash   Doxycycline Rash   Iodinated Contrast Media Rash   Latex Rash   Tape Rash   Adhesive tape   Toradol [ketorolac Tromethamine] Palpitations   sweating        Medication List     STOP taking these medications    aspirin EC 81 MG tablet Replaced by: Aspirin Low Dose 81 MG chewable  tablet   ibuprofen 200 MG tablet Commonly known as: ADVIL       TAKE these medications    acetaminophen 500 MG tablet Commonly known as: TYLENOL Take 1,000 mg by mouth every 6 (six) hours as needed for moderate pain (pain score 4-6).   ACIDOPHILUS (PROBIOTIC) PO Take 1 capsule by mouth daily as needed (yeast infections).   Aspirin Low Dose 81 MG chewable tablet Generic drug: aspirin Chew 1 tablet (81 mg total) by mouth 2 (two) times daily for 21 days. Then resume one 81 mg aspirin daily Replaces: aspirin EC 81 MG tablet   BALMEX EX Apply 1 Application topically daily as needed (rash).   Calmoseptine 0.44-20.6 % Oint Generic drug: Menthol-Zinc Oxide Apply topically as needed.   cetirizine 10 MG tablet Commonly known as: ZYRTEC Take 10 mg by mouth daily.   cyclobenzaprine 10 MG tablet Commonly known as: FLEXERIL Take 10 mg by mouth 3 (three) times daily as needed for muscle spasms.   Euthyrox 137 MCG tablet Generic drug: levothyroxine Take 137 mcg by mouth daily before breakfast.   fexofenadine 180 MG tablet Commonly known as: ALLEGRA Take 180 mg by mouth daily as needed for allergies or rhinitis.   fluconazole 150 MG tablet Commonly known as: DIFLUCAN Take 150 mg by mouth daily.   GAS-X PO Take by mouth.   HYDROmorphone 2 MG tablet Commonly known as: DILAUDID Take 1-2 tablets (2-4 mg total) by mouth every 6 (six) hours as needed for severe pain (pain score 7-10).   magnesium hydroxide 400 MG/5ML suspension Commonly known as: MILK OF MAGNESIA Take 15 mLs by mouth daily as needed for moderate constipation.   metFORMIN 500 MG 24 hr tablet Commonly known as: GLUCOPHAGE-XR Take 1,000 mg by mouth 2 (two) times daily.   miconazole 2 % powder Commonly known as: MICOTIN Apply topically as needed for itching.   omeprazole 20 MG tablet Commonly known as: PRILOSEC OTC Take 20 mg by mouth daily.   ondansetron 4 MG tablet Commonly known as: ZOFRAN Take 1  tablet (4 mg total) by mouth every 6 (six) hours as needed for nausea.   polyethylene glycol powder 17 GM/SCOOP powder Commonly known as: GLYCOLAX/MIRALAX Take 1 dose once to twice daily by mouth in 8 ounces of fluid What changed:  how much to take how to take this when to take this reasons to take this additional instructions   rosuvastatin 20 MG tablet Commonly known as: CRESTOR Take 20 mg by mouth daily.   telmisartan 80 MG tablet Commonly known as: MICARDIS Take 80 mg by mouth daily before breakfast.  Discharge Care Instructions  (From admission, onward)           Start     Ordered   04/29/23 0000  Weight bearing as tolerated        04/29/23 0828   04/29/23 0000  Change dressing       Comments: You have an adhesive waterproof bandage over the incision. Leave this in place until your first follow-up appointment. Once you remove this you will not need to place another bandage.   04/29/23 1610            Follow-up Information     Ollen Gross, MD Follow up in 2 week(s).   Specialty: Orthopedic Surgery Contact information: [address] [place] [address] [phone]                 Signed: R. Arcola Jansky, PA-C Orthopedic Surgery 05/05/2023, 8:00 AM

## 2023-05-10 ENCOUNTER — Other Ambulatory Visit: Payer: Self-pay

## 2023-05-10 DIAGNOSIS — Z7982 Long term (current) use of aspirin: Secondary | ICD-10-CM | POA: Insufficient documentation

## 2023-05-10 DIAGNOSIS — Z96642 Presence of left artificial hip joint: Secondary | ICD-10-CM | POA: Diagnosis not present

## 2023-05-10 DIAGNOSIS — E039 Hypothyroidism, unspecified: Secondary | ICD-10-CM | POA: Insufficient documentation

## 2023-05-10 DIAGNOSIS — Z7984 Long term (current) use of oral hypoglycemic drugs: Secondary | ICD-10-CM | POA: Diagnosis not present

## 2023-05-10 DIAGNOSIS — Z91040 Latex allergy status: Secondary | ICD-10-CM | POA: Diagnosis not present

## 2023-05-10 DIAGNOSIS — T7849XA Other allergy, initial encounter: Secondary | ICD-10-CM | POA: Insufficient documentation

## 2023-05-10 DIAGNOSIS — E119 Type 2 diabetes mellitus without complications: Secondary | ICD-10-CM | POA: Diagnosis not present

## 2023-05-10 DIAGNOSIS — Z85828 Personal history of other malignant neoplasm of skin: Secondary | ICD-10-CM | POA: Diagnosis not present

## 2023-05-10 DIAGNOSIS — T7840XA Allergy, unspecified, initial encounter: Secondary | ICD-10-CM | POA: Diagnosis not present

## 2023-05-10 DIAGNOSIS — Z4800 Encounter for change or removal of nonsurgical wound dressing: Secondary | ICD-10-CM | POA: Diagnosis not present

## 2023-05-10 DIAGNOSIS — I10 Essential (primary) hypertension: Secondary | ICD-10-CM | POA: Diagnosis not present

## 2023-05-11 ENCOUNTER — Emergency Department (HOSPITAL_BASED_OUTPATIENT_CLINIC_OR_DEPARTMENT_OTHER)
Admission: EM | Admit: 2023-05-11 | Discharge: 2023-05-11 | Disposition: A | Discharge status: Home / Self Care | Attending: Emergency Medicine | Admitting: Emergency Medicine

## 2023-05-11 ENCOUNTER — Encounter (HOSPITAL_BASED_OUTPATIENT_CLINIC_OR_DEPARTMENT_OTHER): Payer: Self-pay

## 2023-05-11 DIAGNOSIS — Z5189 Encounter for other specified aftercare: Secondary | ICD-10-CM

## 2023-05-11 DIAGNOSIS — T7849XA Other allergy, initial encounter: Secondary | ICD-10-CM

## 2023-05-11 MED ORDER — TRAMADOL HCL 50 MG PO TABS: 50.0000 mg | ORAL_TABLET | Freq: Once | ORAL | Status: DC

## 2023-05-11 NOTE — ED Triage Notes (Signed)
 Patient had a hip replacement on 3/3. Friday she woke up with drainage from her surgical cite. Patient saw her Dr Friday morning. He advised she had an infection. Would was cleaned and re bandaged at the office. Patient is waiting for a home health nurse. [facility] called [facility] and they advised for her to come to the ER due to having increased drainage from the wound site.

## 2023-05-11 NOTE — ED Provider Notes (Signed)
 [place] EMERGENCY DEPARTMENT AT [facility] Provider Note   CSN: [id number] Arrival date & time: 05/10/23  2349     History  No chief complaint on file.   Jillian [name] is a 78 y.o. female.  HPI      This is a 78 year old female who presents for wound check.  Patient had a hip replacement on the left by Dr. Lequita Halt on 3/3.  She noted drainage from the superior end of the surgical site.  She was seen and evaluated on Friday.  A small portion of the wound had dehisced and was packed.  She was placed on Keflex.  She is awaiting home health.  She states that she noted some increased drainage tonight.  She has not had any fevers or systemic symptoms.  She is taking Dilaudid at home for pain management with good relief. Home Medications Prior to Admission medications   Medication Sig Start Date End Date Taking? Authorizing Provider  acetaminophen (TYLENOL) 500 MG tablet Take 1,000 mg by mouth every 6 (six) hours as needed for moderate pain (pain score 4-6).    [provider]  aspirin 81 MG chewable tablet Chew 1 tablet (81 mg total) by mouth 2 (two) times daily for 21 days. Then resume one 81 mg aspirin daily 04/29/23 05/23/23  Edmisten, Kristie L, PA  cetirizine (ZYRTEC) 10 MG tablet Take 10 mg by mouth daily.    [provider]  cyclobenzaprine (FLEXERIL) 10 MG tablet Take 10 mg by mouth 3 (three) times daily as needed for muscle spasms.    [provider]  EUTHYROX 137 MCG tablet Take 137 mcg by mouth daily before breakfast.    [provider]  fexofenadine (ALLEGRA) 180 MG tablet Take 180 mg by mouth daily as needed for allergies or rhinitis.    [provider]  fluconazole (DIFLUCAN) 150 MG tablet Take 150 mg by mouth daily. 04/09/23   [provider]  HYDROmorphone (DILAUDID) 2 MG tablet Take 1-2 tablets (2-4 mg total) by mouth every 6 (six) hours as needed for severe pain (pain score 7-10). 04/29/23   Edmisten,  Kristie L, PA  magnesium hydroxide (MILK OF MAGNESIA) 400 MG/5ML suspension Take 15 mLs by mouth daily as needed for moderate constipation.    [provider]  Menthol-Zinc Oxide (CALMOSEPTINE) 0.44-20.6 % OINT Apply topically as needed.    [provider]  metFORMIN (GLUCOPHAGE-XR) 500 MG 24 hr tablet Take 1,000 mg by mouth 2 (two) times daily.    [provider]  miconazole (MICOTIN) 2 % powder Apply topically as needed for itching.    [provider]  omeprazole (PRILOSEC OTC) 20 MG tablet Take 20 mg by mouth daily.    [provider]  ondansetron (ZOFRAN) 4 MG tablet Take 1 tablet (4 mg total) by mouth every 6 (six) hours as needed for nausea. 04/29/23   Edmisten, Kristie L, PA  polyethylene glycol powder (GLYCOLAX/MIRALAX) 17 GM/SCOOP powder Take 1 dose once to twice daily by mouth in 8 ounces of fluid Patient taking differently: Take 34 g by mouth daily as needed for moderate constipation. 12/23/19   Esterwood, Amy S, PA-C  Probiotic Product (ACIDOPHILUS, PROBIOTIC, PO) Take 1 capsule by mouth daily as needed (yeast infections).    [provider]  rosuvastatin (CRESTOR) 20 MG tablet Take 20 mg by mouth daily.    [provider]  Simethicone (GAS-X PO) Take by mouth.    [provider]  telmisartan (MICARDIS)  80 MG tablet Take 80 mg by mouth daily before breakfast. 10/02/16   [provider]  Zinc Oxide (BALMEX EX) Apply 1 Application topically daily as needed (rash).    [provider]      Allergies    Hydrocodone-acetaminophen, Nortriptyline, Oxycodone, Metronidazole, Sulfamethoxazole, Amoxicillin, Chlorhexidine, Luminal [phenobarbital], Mobic [meloxicam], Onion, Penicillins, Allopurinol, Ciprofloxacin, Doxycycline, Iodinated contrast media, Latex, Tape, and Toradol [ketorolac tromethamine]    Review of Systems   Review of Systems  Constitutional:  Negative for fever.  Skin:  Negative for color  change.       Postop wound  All other systems reviewed and are negative.   Physical Exam Updated Vital Signs BP (!) 168/80 (BP Location: Left Arm)   Pulse (!) 103   Temp 98 F (36.7 C) (Oral)   Resp 18   Ht 1.6 m (5\' 3" )   Wt 84.4 kg   SpO2 100%   BMI 32.95 kg/m  Physical Exam Vitals and nursing note reviewed.  Constitutional:      Appearance: She is well-developed. She is obese. She is not ill-appearing.  HENT:     Head: Normocephalic and atraumatic.  Eyes:     Pupils: Pupils are equal, round, and reactive to light.  Cardiovascular:     Rate and Rhythm: Normal rate and regular rhythm.     Heart sounds: Normal heart sounds.  Pulmonary:     Effort: Pulmonary effort is normal. No respiratory distress.     Breath sounds: No wheezing.  Abdominal:     Palpations: Abdomen is soft.  Musculoskeletal:     Cervical back: Neck supple.     Comments: Focused examination of wound site with incision over the superior aspect of the anterior left leg extending into the inguinal fold, there is some dehiscence with packing noted superiorly, upon taking off the bandage, there was blistering under the tape.  Multiple blisters appear to have ruptured and these were removed from the operative site itself.  Otherwise there is no purulence, no significant erythema.  There is some erythema in the distribution of tape.  Skin:    General: Skin is warm and dry.  Neurological:     Mental Status: She is alert and oriented to person, place, and time.  Psychiatric:        Mood and Affect: Mood normal.     ED Results / Procedures / Treatments   Labs (all labs ordered are listed, but only abnormal results are displayed) Labs Reviewed - No data to display  EKG None  Radiology No results found.  Procedures Procedures    Medications Ordered in ED Medications  traMADol (ULTRAM) tablet 50 mg (50 mg Oral Not Given 05/11/23 0127)    ED Course/ Medical Decision Making/ A&P                                  Medical Decision Making Risk Prescription drug management.   This patient presents to the ED for concern of increasing drainage from wound, this involves an extensive number of treatment options, and is a complaint that carries with it a high risk of complications and morbidity.  I considered the following differential and admission for this acute, potentially life threatening condition.  The differential diagnosis includes wound infection, allergic reaction  MDM:    This is a 78 year old female who presents with increasing drainage from wound site.  She denies systemic symptoms.  She is nontoxic and vital signs are notable for mild tachycardia.  Dressing was removed.  Patient had a moderate amount of drainage on the gauze which appeared to be singular sanguinous in nature.  Upon removing the dressing, it appears that she likely had a skin reaction to the tape as she had blistering and erythema in the distribution of where the tape was.  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  Suspect that this may have been where the additional drainage was coming from.  There is no adjacent significant erythema or warmth to suggest cellulitis.  She is already on Keflex.  Husband actually states that the wound itself looks better.  Wound was redressed with paper tape.  Will have them follow-up with orthopedics on Monday.  (Labs, imaging, consults)  Labs: I Ordered, and personally interpreted labs.  The pertinent results include: None  Imaging Studies ordered: I ordered imaging studies including none none I independently visualized and interpreted imaging. I agree with the radiologist interpretation  Additional history obtained from chart review.  External records from outside source obtained and reviewed including prior evaluations  Cardiac Monitoring: The patient was maintained on a cardiac monitor.  If on the cardiac monitor, I personally viewed and interpreted the cardiac  monitored which showed an underlying rhythm of: Sinus rhythm  Reevaluation: After the interventions noted above, I reevaluated the patient and found that they have :improved  Social Determinants of Health:  lives independently  Disposition: Discharge  Co morbidities that complicate the patient evaluation  Past Medical History:  Diagnosis Date   Allergy    Anxiety    Bowel obstruction (HCC)    Cancer (HCC)    skin cancer had Mohs Surgery   Cataract    Coronary artery disease    Diabetes mellitus without complication (HCC)    Diverticulitis    GERD (gastroesophageal reflux disease)    Gout    H/O unstable angina    Headache    High cholesterol    History of kidney stones    Hypertension    Hypothyroidism    IBS (irritable bowel syndrome)    Neck pain    PONV (postoperative nausea and vomiting)    Renovascular hypertension    Spondylosis of lumbar spine      Medicines Meds ordered this encounter  Medications   traMADol (ULTRAM) tablet 50 mg    I have reviewed the patients home medicines and have made adjustments as needed  Problem List / ED Course: Problem List Items Addressed This Visit   None Visit Diagnoses       Allergic reaction to adhesive    -  Primary     Visit for wound check                       Final Clinical Impression(s) / ED Diagnoses Final diagnoses:  Allergic reaction to adhesive  Visit for wound check    Rx / DC Orders ED Discharge Orders     None         Inger Wiest, Mayer Masker, MD 05/11/23 [phone]

## 2023-05-11 NOTE — Discharge Instructions (Signed)
 Were seen today for increased drainage from your postop site.  If blistering where the tape was.  This is likely an adhesive reaction which you have a known allergy to.  Use paper tape.  You are given supplies.  The actual wound does not appear acutely infected.  Follow-up with orthopedics.

## 2023-05-13 DIAGNOSIS — M549 Dorsalgia, unspecified: Secondary | ICD-10-CM | POA: Diagnosis not present

## 2023-05-15 DIAGNOSIS — M47816 Spondylosis without myelopathy or radiculopathy, lumbar region: Secondary | ICD-10-CM | POA: Diagnosis not present

## 2023-05-15 DIAGNOSIS — I10 Essential (primary) hypertension: Secondary | ICD-10-CM | POA: Diagnosis not present

## 2023-05-15 DIAGNOSIS — K581 Irritable bowel syndrome with constipation: Secondary | ICD-10-CM | POA: Diagnosis not present

## 2023-05-15 DIAGNOSIS — G43909 Migraine, unspecified, not intractable, without status migrainosus: Secondary | ICD-10-CM | POA: Diagnosis not present

## 2023-05-15 DIAGNOSIS — E1136 Type 2 diabetes mellitus with diabetic cataract: Secondary | ICD-10-CM | POA: Diagnosis not present

## 2023-05-15 DIAGNOSIS — M109 Gout, unspecified: Secondary | ICD-10-CM | POA: Diagnosis not present

## 2023-05-15 DIAGNOSIS — H9313 Tinnitus, bilateral: Secondary | ICD-10-CM | POA: Diagnosis not present

## 2023-05-15 DIAGNOSIS — Z87442 Personal history of urinary calculi: Secondary | ICD-10-CM | POA: Diagnosis not present

## 2023-05-15 DIAGNOSIS — Z85828 Personal history of other malignant neoplasm of skin: Secondary | ICD-10-CM | POA: Diagnosis not present

## 2023-05-15 DIAGNOSIS — M5416 Radiculopathy, lumbar region: Secondary | ICD-10-CM | POA: Diagnosis not present

## 2023-05-15 DIAGNOSIS — F419 Anxiety disorder, unspecified: Secondary | ICD-10-CM | POA: Diagnosis not present

## 2023-05-15 DIAGNOSIS — I2510 Atherosclerotic heart disease of native coronary artery without angina pectoris: Secondary | ICD-10-CM | POA: Diagnosis not present

## 2023-05-15 DIAGNOSIS — K219 Gastro-esophageal reflux disease without esophagitis: Secondary | ICD-10-CM | POA: Diagnosis not present

## 2023-05-15 DIAGNOSIS — R32 Unspecified urinary incontinence: Secondary | ICD-10-CM | POA: Diagnosis not present

## 2023-05-15 DIAGNOSIS — Z7982 Long term (current) use of aspirin: Secondary | ICD-10-CM | POA: Diagnosis not present

## 2023-05-15 DIAGNOSIS — Z7984 Long term (current) use of oral hypoglycemic drugs: Secondary | ICD-10-CM | POA: Diagnosis not present

## 2023-05-15 DIAGNOSIS — E7800 Pure hypercholesterolemia, unspecified: Secondary | ICD-10-CM | POA: Diagnosis not present

## 2023-05-15 DIAGNOSIS — T8131XA Disruption of external operation (surgical) wound, not elsewhere classified, initial encounter: Secondary | ICD-10-CM | POA: Diagnosis not present

## 2023-05-15 DIAGNOSIS — M419 Scoliosis, unspecified: Secondary | ICD-10-CM | POA: Diagnosis not present

## 2023-05-15 DIAGNOSIS — Z9049 Acquired absence of other specified parts of digestive tract: Secondary | ICD-10-CM | POA: Diagnosis not present

## 2023-05-15 DIAGNOSIS — E039 Hypothyroidism, unspecified: Secondary | ICD-10-CM | POA: Diagnosis not present

## 2023-05-19 DIAGNOSIS — E7800 Pure hypercholesterolemia, unspecified: Secondary | ICD-10-CM | POA: Diagnosis not present

## 2023-05-19 DIAGNOSIS — I10 Essential (primary) hypertension: Secondary | ICD-10-CM | POA: Diagnosis not present

## 2023-05-19 DIAGNOSIS — M109 Gout, unspecified: Secondary | ICD-10-CM | POA: Diagnosis not present

## 2023-05-19 DIAGNOSIS — F419 Anxiety disorder, unspecified: Secondary | ICD-10-CM | POA: Diagnosis not present

## 2023-05-19 DIAGNOSIS — E039 Hypothyroidism, unspecified: Secondary | ICD-10-CM | POA: Diagnosis not present

## 2023-05-19 DIAGNOSIS — M5416 Radiculopathy, lumbar region: Secondary | ICD-10-CM | POA: Diagnosis not present

## 2023-05-19 DIAGNOSIS — M47816 Spondylosis without myelopathy or radiculopathy, lumbar region: Secondary | ICD-10-CM | POA: Diagnosis not present

## 2023-05-19 DIAGNOSIS — I2510 Atherosclerotic heart disease of native coronary artery without angina pectoris: Secondary | ICD-10-CM | POA: Diagnosis not present

## 2023-05-19 DIAGNOSIS — T8131XA Disruption of external operation (surgical) wound, not elsewhere classified, initial encounter: Secondary | ICD-10-CM | POA: Diagnosis not present

## 2023-05-19 DIAGNOSIS — K219 Gastro-esophageal reflux disease without esophagitis: Secondary | ICD-10-CM | POA: Diagnosis not present

## 2023-05-19 DIAGNOSIS — M419 Scoliosis, unspecified: Secondary | ICD-10-CM | POA: Diagnosis not present

## 2023-05-19 DIAGNOSIS — R32 Unspecified urinary incontinence: Secondary | ICD-10-CM | POA: Diagnosis not present

## 2023-05-19 DIAGNOSIS — K581 Irritable bowel syndrome with constipation: Secondary | ICD-10-CM | POA: Diagnosis not present

## 2023-05-19 DIAGNOSIS — H9313 Tinnitus, bilateral: Secondary | ICD-10-CM | POA: Diagnosis not present

## 2023-05-19 DIAGNOSIS — E1136 Type 2 diabetes mellitus with diabetic cataract: Secondary | ICD-10-CM | POA: Diagnosis not present

## 2023-05-19 DIAGNOSIS — G43909 Migraine, unspecified, not intractable, without status migrainosus: Secondary | ICD-10-CM | POA: Diagnosis not present

## 2023-05-20 DIAGNOSIS — G43909 Migraine, unspecified, not intractable, without status migrainosus: Secondary | ICD-10-CM | POA: Diagnosis not present

## 2023-05-20 DIAGNOSIS — H9313 Tinnitus, bilateral: Secondary | ICD-10-CM | POA: Diagnosis not present

## 2023-05-20 DIAGNOSIS — I2510 Atherosclerotic heart disease of native coronary artery without angina pectoris: Secondary | ICD-10-CM | POA: Diagnosis not present

## 2023-05-20 DIAGNOSIS — E7800 Pure hypercholesterolemia, unspecified: Secondary | ICD-10-CM | POA: Diagnosis not present

## 2023-05-20 DIAGNOSIS — E039 Hypothyroidism, unspecified: Secondary | ICD-10-CM | POA: Diagnosis not present

## 2023-05-20 DIAGNOSIS — F419 Anxiety disorder, unspecified: Secondary | ICD-10-CM | POA: Diagnosis not present

## 2023-05-20 DIAGNOSIS — E1136 Type 2 diabetes mellitus with diabetic cataract: Secondary | ICD-10-CM | POA: Diagnosis not present

## 2023-05-20 DIAGNOSIS — M47816 Spondylosis without myelopathy or radiculopathy, lumbar region: Secondary | ICD-10-CM | POA: Diagnosis not present

## 2023-05-20 DIAGNOSIS — R32 Unspecified urinary incontinence: Secondary | ICD-10-CM | POA: Diagnosis not present

## 2023-05-20 DIAGNOSIS — K581 Irritable bowel syndrome with constipation: Secondary | ICD-10-CM | POA: Diagnosis not present

## 2023-05-20 DIAGNOSIS — I10 Essential (primary) hypertension: Secondary | ICD-10-CM | POA: Diagnosis not present

## 2023-05-20 DIAGNOSIS — M419 Scoliosis, unspecified: Secondary | ICD-10-CM | POA: Diagnosis not present

## 2023-05-20 DIAGNOSIS — T8131XA Disruption of external operation (surgical) wound, not elsewhere classified, initial encounter: Secondary | ICD-10-CM | POA: Diagnosis not present

## 2023-05-20 DIAGNOSIS — M5416 Radiculopathy, lumbar region: Secondary | ICD-10-CM | POA: Diagnosis not present

## 2023-05-20 DIAGNOSIS — K219 Gastro-esophageal reflux disease without esophagitis: Secondary | ICD-10-CM | POA: Diagnosis not present

## 2023-05-20 DIAGNOSIS — M109 Gout, unspecified: Secondary | ICD-10-CM | POA: Diagnosis not present

## 2023-05-22 DIAGNOSIS — K581 Irritable bowel syndrome with constipation: Secondary | ICD-10-CM | POA: Diagnosis not present

## 2023-05-22 DIAGNOSIS — H9313 Tinnitus, bilateral: Secondary | ICD-10-CM | POA: Diagnosis not present

## 2023-05-22 DIAGNOSIS — K219 Gastro-esophageal reflux disease without esophagitis: Secondary | ICD-10-CM | POA: Diagnosis not present

## 2023-05-22 DIAGNOSIS — M5416 Radiculopathy, lumbar region: Secondary | ICD-10-CM | POA: Diagnosis not present

## 2023-05-22 DIAGNOSIS — E039 Hypothyroidism, unspecified: Secondary | ICD-10-CM | POA: Diagnosis not present

## 2023-05-22 DIAGNOSIS — E7800 Pure hypercholesterolemia, unspecified: Secondary | ICD-10-CM | POA: Diagnosis not present

## 2023-05-22 DIAGNOSIS — I10 Essential (primary) hypertension: Secondary | ICD-10-CM | POA: Diagnosis not present

## 2023-05-22 DIAGNOSIS — F419 Anxiety disorder, unspecified: Secondary | ICD-10-CM | POA: Diagnosis not present

## 2023-05-22 DIAGNOSIS — T8131XA Disruption of external operation (surgical) wound, not elsewhere classified, initial encounter: Secondary | ICD-10-CM | POA: Diagnosis not present

## 2023-05-22 DIAGNOSIS — I2510 Atherosclerotic heart disease of native coronary artery without angina pectoris: Secondary | ICD-10-CM | POA: Diagnosis not present

## 2023-05-22 DIAGNOSIS — M47816 Spondylosis without myelopathy or radiculopathy, lumbar region: Secondary | ICD-10-CM | POA: Diagnosis not present

## 2023-05-22 DIAGNOSIS — M419 Scoliosis, unspecified: Secondary | ICD-10-CM | POA: Diagnosis not present

## 2023-05-22 DIAGNOSIS — M109 Gout, unspecified: Secondary | ICD-10-CM | POA: Diagnosis not present

## 2023-05-22 DIAGNOSIS — G43909 Migraine, unspecified, not intractable, without status migrainosus: Secondary | ICD-10-CM | POA: Diagnosis not present

## 2023-05-22 DIAGNOSIS — R32 Unspecified urinary incontinence: Secondary | ICD-10-CM | POA: Diagnosis not present

## 2023-05-22 DIAGNOSIS — E1136 Type 2 diabetes mellitus with diabetic cataract: Secondary | ICD-10-CM | POA: Diagnosis not present

## 2023-05-23 ENCOUNTER — Other Ambulatory Visit (HOSPITAL_COMMUNITY): Payer: Self-pay

## 2023-05-27 DIAGNOSIS — F419 Anxiety disorder, unspecified: Secondary | ICD-10-CM | POA: Diagnosis not present

## 2023-05-27 DIAGNOSIS — R32 Unspecified urinary incontinence: Secondary | ICD-10-CM | POA: Diagnosis not present

## 2023-05-27 DIAGNOSIS — E1136 Type 2 diabetes mellitus with diabetic cataract: Secondary | ICD-10-CM | POA: Diagnosis not present

## 2023-05-27 DIAGNOSIS — E7800 Pure hypercholesterolemia, unspecified: Secondary | ICD-10-CM | POA: Diagnosis not present

## 2023-05-27 DIAGNOSIS — G43909 Migraine, unspecified, not intractable, without status migrainosus: Secondary | ICD-10-CM | POA: Diagnosis not present

## 2023-05-27 DIAGNOSIS — M5416 Radiculopathy, lumbar region: Secondary | ICD-10-CM | POA: Diagnosis not present

## 2023-05-27 DIAGNOSIS — K581 Irritable bowel syndrome with constipation: Secondary | ICD-10-CM | POA: Diagnosis not present

## 2023-05-27 DIAGNOSIS — H9313 Tinnitus, bilateral: Secondary | ICD-10-CM | POA: Diagnosis not present

## 2023-05-27 DIAGNOSIS — M419 Scoliosis, unspecified: Secondary | ICD-10-CM | POA: Diagnosis not present

## 2023-05-27 DIAGNOSIS — M109 Gout, unspecified: Secondary | ICD-10-CM | POA: Diagnosis not present

## 2023-05-27 DIAGNOSIS — T8131XA Disruption of external operation (surgical) wound, not elsewhere classified, initial encounter: Secondary | ICD-10-CM | POA: Diagnosis not present

## 2023-05-27 DIAGNOSIS — I2510 Atherosclerotic heart disease of native coronary artery without angina pectoris: Secondary | ICD-10-CM | POA: Diagnosis not present

## 2023-05-27 DIAGNOSIS — M47816 Spondylosis without myelopathy or radiculopathy, lumbar region: Secondary | ICD-10-CM | POA: Diagnosis not present

## 2023-05-27 DIAGNOSIS — Z5189 Encounter for other specified aftercare: Secondary | ICD-10-CM | POA: Diagnosis not present

## 2023-05-27 DIAGNOSIS — E039 Hypothyroidism, unspecified: Secondary | ICD-10-CM | POA: Diagnosis not present

## 2023-05-27 DIAGNOSIS — K219 Gastro-esophageal reflux disease without esophagitis: Secondary | ICD-10-CM | POA: Diagnosis not present

## 2023-05-27 DIAGNOSIS — I10 Essential (primary) hypertension: Secondary | ICD-10-CM | POA: Diagnosis not present

## 2023-05-29 DIAGNOSIS — M47816 Spondylosis without myelopathy or radiculopathy, lumbar region: Secondary | ICD-10-CM | POA: Diagnosis not present

## 2023-05-29 DIAGNOSIS — I10 Essential (primary) hypertension: Secondary | ICD-10-CM | POA: Diagnosis not present

## 2023-05-29 DIAGNOSIS — K219 Gastro-esophageal reflux disease without esophagitis: Secondary | ICD-10-CM | POA: Diagnosis not present

## 2023-05-29 DIAGNOSIS — R32 Unspecified urinary incontinence: Secondary | ICD-10-CM | POA: Diagnosis not present

## 2023-05-29 DIAGNOSIS — K581 Irritable bowel syndrome with constipation: Secondary | ICD-10-CM | POA: Diagnosis not present

## 2023-05-29 DIAGNOSIS — M5416 Radiculopathy, lumbar region: Secondary | ICD-10-CM | POA: Diagnosis not present

## 2023-05-29 DIAGNOSIS — I2510 Atherosclerotic heart disease of native coronary artery without angina pectoris: Secondary | ICD-10-CM | POA: Diagnosis not present

## 2023-05-29 DIAGNOSIS — H9313 Tinnitus, bilateral: Secondary | ICD-10-CM | POA: Diagnosis not present

## 2023-05-29 DIAGNOSIS — M419 Scoliosis, unspecified: Secondary | ICD-10-CM | POA: Diagnosis not present

## 2023-05-29 DIAGNOSIS — E039 Hypothyroidism, unspecified: Secondary | ICD-10-CM | POA: Diagnosis not present

## 2023-05-29 DIAGNOSIS — E7800 Pure hypercholesterolemia, unspecified: Secondary | ICD-10-CM | POA: Diagnosis not present

## 2023-05-29 DIAGNOSIS — G43909 Migraine, unspecified, not intractable, without status migrainosus: Secondary | ICD-10-CM | POA: Diagnosis not present

## 2023-05-29 DIAGNOSIS — E1136 Type 2 diabetes mellitus with diabetic cataract: Secondary | ICD-10-CM | POA: Diagnosis not present

## 2023-05-29 DIAGNOSIS — M109 Gout, unspecified: Secondary | ICD-10-CM | POA: Diagnosis not present

## 2023-05-29 DIAGNOSIS — F419 Anxiety disorder, unspecified: Secondary | ICD-10-CM | POA: Diagnosis not present

## 2023-05-29 DIAGNOSIS — T8131XA Disruption of external operation (surgical) wound, not elsewhere classified, initial encounter: Secondary | ICD-10-CM | POA: Diagnosis not present

## 2023-06-02 DIAGNOSIS — I2510 Atherosclerotic heart disease of native coronary artery without angina pectoris: Secondary | ICD-10-CM | POA: Diagnosis not present

## 2023-06-02 DIAGNOSIS — H9313 Tinnitus, bilateral: Secondary | ICD-10-CM | POA: Diagnosis not present

## 2023-06-02 DIAGNOSIS — K581 Irritable bowel syndrome with constipation: Secondary | ICD-10-CM | POA: Diagnosis not present

## 2023-06-02 DIAGNOSIS — E7800 Pure hypercholesterolemia, unspecified: Secondary | ICD-10-CM | POA: Diagnosis not present

## 2023-06-02 DIAGNOSIS — M109 Gout, unspecified: Secondary | ICD-10-CM | POA: Diagnosis not present

## 2023-06-02 DIAGNOSIS — E039 Hypothyroidism, unspecified: Secondary | ICD-10-CM | POA: Diagnosis not present

## 2023-06-02 DIAGNOSIS — G43909 Migraine, unspecified, not intractable, without status migrainosus: Secondary | ICD-10-CM | POA: Diagnosis not present

## 2023-06-02 DIAGNOSIS — M5416 Radiculopathy, lumbar region: Secondary | ICD-10-CM | POA: Diagnosis not present

## 2023-06-02 DIAGNOSIS — I10 Essential (primary) hypertension: Secondary | ICD-10-CM | POA: Diagnosis not present

## 2023-06-02 DIAGNOSIS — E1136 Type 2 diabetes mellitus with diabetic cataract: Secondary | ICD-10-CM | POA: Diagnosis not present

## 2023-06-02 DIAGNOSIS — M419 Scoliosis, unspecified: Secondary | ICD-10-CM | POA: Diagnosis not present

## 2023-06-02 DIAGNOSIS — M47816 Spondylosis without myelopathy or radiculopathy, lumbar region: Secondary | ICD-10-CM | POA: Diagnosis not present

## 2023-06-02 DIAGNOSIS — T8131XA Disruption of external operation (surgical) wound, not elsewhere classified, initial encounter: Secondary | ICD-10-CM | POA: Diagnosis not present

## 2023-06-02 DIAGNOSIS — F419 Anxiety disorder, unspecified: Secondary | ICD-10-CM | POA: Diagnosis not present

## 2023-06-02 DIAGNOSIS — R32 Unspecified urinary incontinence: Secondary | ICD-10-CM | POA: Diagnosis not present

## 2023-06-02 DIAGNOSIS — K219 Gastro-esophageal reflux disease without esophagitis: Secondary | ICD-10-CM | POA: Diagnosis not present

## 2023-06-03 DIAGNOSIS — E7800 Pure hypercholesterolemia, unspecified: Secondary | ICD-10-CM | POA: Diagnosis not present

## 2023-06-03 DIAGNOSIS — T8131XA Disruption of external operation (surgical) wound, not elsewhere classified, initial encounter: Secondary | ICD-10-CM | POA: Diagnosis not present

## 2023-06-03 DIAGNOSIS — I10 Essential (primary) hypertension: Secondary | ICD-10-CM | POA: Diagnosis not present

## 2023-06-03 DIAGNOSIS — E1136 Type 2 diabetes mellitus with diabetic cataract: Secondary | ICD-10-CM | POA: Diagnosis not present

## 2023-06-03 DIAGNOSIS — G43909 Migraine, unspecified, not intractable, without status migrainosus: Secondary | ICD-10-CM | POA: Diagnosis not present

## 2023-06-03 DIAGNOSIS — M5416 Radiculopathy, lumbar region: Secondary | ICD-10-CM | POA: Diagnosis not present

## 2023-06-03 DIAGNOSIS — E039 Hypothyroidism, unspecified: Secondary | ICD-10-CM | POA: Diagnosis not present

## 2023-06-03 DIAGNOSIS — H9313 Tinnitus, bilateral: Secondary | ICD-10-CM | POA: Diagnosis not present

## 2023-06-03 DIAGNOSIS — M419 Scoliosis, unspecified: Secondary | ICD-10-CM | POA: Diagnosis not present

## 2023-06-03 DIAGNOSIS — I2510 Atherosclerotic heart disease of native coronary artery without angina pectoris: Secondary | ICD-10-CM | POA: Diagnosis not present

## 2023-06-03 DIAGNOSIS — F419 Anxiety disorder, unspecified: Secondary | ICD-10-CM | POA: Diagnosis not present

## 2023-06-03 DIAGNOSIS — K219 Gastro-esophageal reflux disease without esophagitis: Secondary | ICD-10-CM | POA: Diagnosis not present

## 2023-06-03 DIAGNOSIS — M47816 Spondylosis without myelopathy or radiculopathy, lumbar region: Secondary | ICD-10-CM | POA: Diagnosis not present

## 2023-06-03 DIAGNOSIS — R32 Unspecified urinary incontinence: Secondary | ICD-10-CM | POA: Diagnosis not present

## 2023-06-03 DIAGNOSIS — K581 Irritable bowel syndrome with constipation: Secondary | ICD-10-CM | POA: Diagnosis not present

## 2023-06-03 DIAGNOSIS — M109 Gout, unspecified: Secondary | ICD-10-CM | POA: Diagnosis not present

## 2023-06-04 DIAGNOSIS — M47816 Spondylosis without myelopathy or radiculopathy, lumbar region: Secondary | ICD-10-CM | POA: Diagnosis not present

## 2023-06-04 DIAGNOSIS — I2510 Atherosclerotic heart disease of native coronary artery without angina pectoris: Secondary | ICD-10-CM | POA: Diagnosis not present

## 2023-06-04 DIAGNOSIS — I10 Essential (primary) hypertension: Secondary | ICD-10-CM | POA: Diagnosis not present

## 2023-06-04 DIAGNOSIS — R32 Unspecified urinary incontinence: Secondary | ICD-10-CM | POA: Diagnosis not present

## 2023-06-04 DIAGNOSIS — E7800 Pure hypercholesterolemia, unspecified: Secondary | ICD-10-CM | POA: Diagnosis not present

## 2023-06-04 DIAGNOSIS — M109 Gout, unspecified: Secondary | ICD-10-CM | POA: Diagnosis not present

## 2023-06-04 DIAGNOSIS — G43909 Migraine, unspecified, not intractable, without status migrainosus: Secondary | ICD-10-CM | POA: Diagnosis not present

## 2023-06-04 DIAGNOSIS — H9313 Tinnitus, bilateral: Secondary | ICD-10-CM | POA: Diagnosis not present

## 2023-06-04 DIAGNOSIS — M5416 Radiculopathy, lumbar region: Secondary | ICD-10-CM | POA: Diagnosis not present

## 2023-06-04 DIAGNOSIS — E1136 Type 2 diabetes mellitus with diabetic cataract: Secondary | ICD-10-CM | POA: Diagnosis not present

## 2023-06-04 DIAGNOSIS — E039 Hypothyroidism, unspecified: Secondary | ICD-10-CM | POA: Diagnosis not present

## 2023-06-04 DIAGNOSIS — K219 Gastro-esophageal reflux disease without esophagitis: Secondary | ICD-10-CM | POA: Diagnosis not present

## 2023-06-04 DIAGNOSIS — M419 Scoliosis, unspecified: Secondary | ICD-10-CM | POA: Diagnosis not present

## 2023-06-04 DIAGNOSIS — T8131XA Disruption of external operation (surgical) wound, not elsewhere classified, initial encounter: Secondary | ICD-10-CM | POA: Diagnosis not present

## 2023-06-04 DIAGNOSIS — K581 Irritable bowel syndrome with constipation: Secondary | ICD-10-CM | POA: Diagnosis not present

## 2023-06-04 DIAGNOSIS — F419 Anxiety disorder, unspecified: Secondary | ICD-10-CM | POA: Diagnosis not present

## 2023-06-11 DIAGNOSIS — M419 Scoliosis, unspecified: Secondary | ICD-10-CM | POA: Diagnosis not present

## 2023-06-11 DIAGNOSIS — M109 Gout, unspecified: Secondary | ICD-10-CM | POA: Diagnosis not present

## 2023-06-11 DIAGNOSIS — K581 Irritable bowel syndrome with constipation: Secondary | ICD-10-CM | POA: Diagnosis not present

## 2023-06-11 DIAGNOSIS — F419 Anxiety disorder, unspecified: Secondary | ICD-10-CM | POA: Diagnosis not present

## 2023-06-11 DIAGNOSIS — G43909 Migraine, unspecified, not intractable, without status migrainosus: Secondary | ICD-10-CM | POA: Diagnosis not present

## 2023-06-11 DIAGNOSIS — T8131XA Disruption of external operation (surgical) wound, not elsewhere classified, initial encounter: Secondary | ICD-10-CM | POA: Diagnosis not present

## 2023-06-11 DIAGNOSIS — K219 Gastro-esophageal reflux disease without esophagitis: Secondary | ICD-10-CM | POA: Diagnosis not present

## 2023-06-11 DIAGNOSIS — E1136 Type 2 diabetes mellitus with diabetic cataract: Secondary | ICD-10-CM | POA: Diagnosis not present

## 2023-06-11 DIAGNOSIS — H9313 Tinnitus, bilateral: Secondary | ICD-10-CM | POA: Diagnosis not present

## 2023-06-11 DIAGNOSIS — I2510 Atherosclerotic heart disease of native coronary artery without angina pectoris: Secondary | ICD-10-CM | POA: Diagnosis not present

## 2023-06-11 DIAGNOSIS — R32 Unspecified urinary incontinence: Secondary | ICD-10-CM | POA: Diagnosis not present

## 2023-06-11 DIAGNOSIS — E7800 Pure hypercholesterolemia, unspecified: Secondary | ICD-10-CM | POA: Diagnosis not present

## 2023-06-11 DIAGNOSIS — M47816 Spondylosis without myelopathy or radiculopathy, lumbar region: Secondary | ICD-10-CM | POA: Diagnosis not present

## 2023-06-11 DIAGNOSIS — M5416 Radiculopathy, lumbar region: Secondary | ICD-10-CM | POA: Diagnosis not present

## 2023-06-11 DIAGNOSIS — I10 Essential (primary) hypertension: Secondary | ICD-10-CM | POA: Diagnosis not present

## 2023-06-11 DIAGNOSIS — E039 Hypothyroidism, unspecified: Secondary | ICD-10-CM | POA: Diagnosis not present

## 2023-06-13 DIAGNOSIS — M419 Scoliosis, unspecified: Secondary | ICD-10-CM | POA: Diagnosis not present

## 2023-06-13 DIAGNOSIS — E7800 Pure hypercholesterolemia, unspecified: Secondary | ICD-10-CM | POA: Diagnosis not present

## 2023-06-13 DIAGNOSIS — T8131XA Disruption of external operation (surgical) wound, not elsewhere classified, initial encounter: Secondary | ICD-10-CM | POA: Diagnosis not present

## 2023-06-13 DIAGNOSIS — E1136 Type 2 diabetes mellitus with diabetic cataract: Secondary | ICD-10-CM | POA: Diagnosis not present

## 2023-06-13 DIAGNOSIS — M47816 Spondylosis without myelopathy or radiculopathy, lumbar region: Secondary | ICD-10-CM | POA: Diagnosis not present

## 2023-06-13 DIAGNOSIS — K219 Gastro-esophageal reflux disease without esophagitis: Secondary | ICD-10-CM | POA: Diagnosis not present

## 2023-06-13 DIAGNOSIS — M109 Gout, unspecified: Secondary | ICD-10-CM | POA: Diagnosis not present

## 2023-06-13 DIAGNOSIS — R32 Unspecified urinary incontinence: Secondary | ICD-10-CM | POA: Diagnosis not present

## 2023-06-13 DIAGNOSIS — K581 Irritable bowel syndrome with constipation: Secondary | ICD-10-CM | POA: Diagnosis not present

## 2023-06-13 DIAGNOSIS — G43909 Migraine, unspecified, not intractable, without status migrainosus: Secondary | ICD-10-CM | POA: Diagnosis not present

## 2023-06-13 DIAGNOSIS — I2510 Atherosclerotic heart disease of native coronary artery without angina pectoris: Secondary | ICD-10-CM | POA: Diagnosis not present

## 2023-06-13 DIAGNOSIS — M5416 Radiculopathy, lumbar region: Secondary | ICD-10-CM | POA: Diagnosis not present

## 2023-06-13 DIAGNOSIS — E039 Hypothyroidism, unspecified: Secondary | ICD-10-CM | POA: Diagnosis not present

## 2023-06-13 DIAGNOSIS — I10 Essential (primary) hypertension: Secondary | ICD-10-CM | POA: Diagnosis not present

## 2023-06-13 DIAGNOSIS — F419 Anxiety disorder, unspecified: Secondary | ICD-10-CM | POA: Diagnosis not present

## 2023-06-13 DIAGNOSIS — H9313 Tinnitus, bilateral: Secondary | ICD-10-CM | POA: Diagnosis not present

## 2023-06-14 DIAGNOSIS — M47816 Spondylosis without myelopathy or radiculopathy, lumbar region: Secondary | ICD-10-CM | POA: Diagnosis not present

## 2023-06-14 DIAGNOSIS — Z87442 Personal history of urinary calculi: Secondary | ICD-10-CM | POA: Diagnosis not present

## 2023-06-14 DIAGNOSIS — K219 Gastro-esophageal reflux disease without esophagitis: Secondary | ICD-10-CM | POA: Diagnosis not present

## 2023-06-14 DIAGNOSIS — R32 Unspecified urinary incontinence: Secondary | ICD-10-CM | POA: Diagnosis not present

## 2023-06-14 DIAGNOSIS — Z9049 Acquired absence of other specified parts of digestive tract: Secondary | ICD-10-CM | POA: Diagnosis not present

## 2023-06-14 DIAGNOSIS — M109 Gout, unspecified: Secondary | ICD-10-CM | POA: Diagnosis not present

## 2023-06-14 DIAGNOSIS — H9313 Tinnitus, bilateral: Secondary | ICD-10-CM | POA: Diagnosis not present

## 2023-06-14 DIAGNOSIS — E7800 Pure hypercholesterolemia, unspecified: Secondary | ICD-10-CM | POA: Diagnosis not present

## 2023-06-14 DIAGNOSIS — F419 Anxiety disorder, unspecified: Secondary | ICD-10-CM | POA: Diagnosis not present

## 2023-06-14 DIAGNOSIS — Z85828 Personal history of other malignant neoplasm of skin: Secondary | ICD-10-CM | POA: Diagnosis not present

## 2023-06-14 DIAGNOSIS — E039 Hypothyroidism, unspecified: Secondary | ICD-10-CM | POA: Diagnosis not present

## 2023-06-14 DIAGNOSIS — M5416 Radiculopathy, lumbar region: Secondary | ICD-10-CM | POA: Diagnosis not present

## 2023-06-14 DIAGNOSIS — I10 Essential (primary) hypertension: Secondary | ICD-10-CM | POA: Diagnosis not present

## 2023-06-14 DIAGNOSIS — Z7984 Long term (current) use of oral hypoglycemic drugs: Secondary | ICD-10-CM | POA: Diagnosis not present

## 2023-06-14 DIAGNOSIS — G43909 Migraine, unspecified, not intractable, without status migrainosus: Secondary | ICD-10-CM | POA: Diagnosis not present

## 2023-06-14 DIAGNOSIS — Z7982 Long term (current) use of aspirin: Secondary | ICD-10-CM | POA: Diagnosis not present

## 2023-06-14 DIAGNOSIS — M419 Scoliosis, unspecified: Secondary | ICD-10-CM | POA: Diagnosis not present

## 2023-06-14 DIAGNOSIS — E1136 Type 2 diabetes mellitus with diabetic cataract: Secondary | ICD-10-CM | POA: Diagnosis not present

## 2023-06-14 DIAGNOSIS — K581 Irritable bowel syndrome with constipation: Secondary | ICD-10-CM | POA: Diagnosis not present

## 2023-06-14 DIAGNOSIS — I2510 Atherosclerotic heart disease of native coronary artery without angina pectoris: Secondary | ICD-10-CM | POA: Diagnosis not present

## 2023-06-20 DIAGNOSIS — F419 Anxiety disorder, unspecified: Secondary | ICD-10-CM | POA: Diagnosis not present

## 2023-06-20 DIAGNOSIS — E7800 Pure hypercholesterolemia, unspecified: Secondary | ICD-10-CM | POA: Diagnosis not present

## 2023-06-20 DIAGNOSIS — M109 Gout, unspecified: Secondary | ICD-10-CM | POA: Diagnosis not present

## 2023-06-20 DIAGNOSIS — T8131XA Disruption of external operation (surgical) wound, not elsewhere classified, initial encounter: Secondary | ICD-10-CM | POA: Diagnosis not present

## 2023-06-20 DIAGNOSIS — K219 Gastro-esophageal reflux disease without esophagitis: Secondary | ICD-10-CM | POA: Diagnosis not present

## 2023-06-20 DIAGNOSIS — M47816 Spondylosis without myelopathy or radiculopathy, lumbar region: Secondary | ICD-10-CM | POA: Diagnosis not present

## 2023-06-20 DIAGNOSIS — K581 Irritable bowel syndrome with constipation: Secondary | ICD-10-CM | POA: Diagnosis not present

## 2023-06-20 DIAGNOSIS — I10 Essential (primary) hypertension: Secondary | ICD-10-CM | POA: Diagnosis not present

## 2023-06-20 DIAGNOSIS — E039 Hypothyroidism, unspecified: Secondary | ICD-10-CM | POA: Diagnosis not present

## 2023-06-20 DIAGNOSIS — I2510 Atherosclerotic heart disease of native coronary artery without angina pectoris: Secondary | ICD-10-CM | POA: Diagnosis not present

## 2023-06-20 DIAGNOSIS — H9313 Tinnitus, bilateral: Secondary | ICD-10-CM | POA: Diagnosis not present

## 2023-06-20 DIAGNOSIS — R32 Unspecified urinary incontinence: Secondary | ICD-10-CM | POA: Diagnosis not present

## 2023-06-20 DIAGNOSIS — M5416 Radiculopathy, lumbar region: Secondary | ICD-10-CM | POA: Diagnosis not present

## 2023-06-20 DIAGNOSIS — M419 Scoliosis, unspecified: Secondary | ICD-10-CM | POA: Diagnosis not present

## 2023-06-20 DIAGNOSIS — E1136 Type 2 diabetes mellitus with diabetic cataract: Secondary | ICD-10-CM | POA: Diagnosis not present

## 2023-06-20 DIAGNOSIS — G43909 Migraine, unspecified, not intractable, without status migrainosus: Secondary | ICD-10-CM | POA: Diagnosis not present

## 2023-06-23 DIAGNOSIS — I2510 Atherosclerotic heart disease of native coronary artery without angina pectoris: Secondary | ICD-10-CM | POA: Diagnosis not present

## 2023-06-23 DIAGNOSIS — E1136 Type 2 diabetes mellitus with diabetic cataract: Secondary | ICD-10-CM | POA: Diagnosis not present

## 2023-06-23 DIAGNOSIS — E7800 Pure hypercholesterolemia, unspecified: Secondary | ICD-10-CM | POA: Diagnosis not present

## 2023-06-23 DIAGNOSIS — M109 Gout, unspecified: Secondary | ICD-10-CM | POA: Diagnosis not present

## 2023-06-23 DIAGNOSIS — M5416 Radiculopathy, lumbar region: Secondary | ICD-10-CM | POA: Diagnosis not present

## 2023-06-23 DIAGNOSIS — H9313 Tinnitus, bilateral: Secondary | ICD-10-CM | POA: Diagnosis not present

## 2023-06-23 DIAGNOSIS — I10 Essential (primary) hypertension: Secondary | ICD-10-CM | POA: Diagnosis not present

## 2023-06-23 DIAGNOSIS — R32 Unspecified urinary incontinence: Secondary | ICD-10-CM | POA: Diagnosis not present

## 2023-06-23 DIAGNOSIS — G43909 Migraine, unspecified, not intractable, without status migrainosus: Secondary | ICD-10-CM | POA: Diagnosis not present

## 2023-06-23 DIAGNOSIS — F419 Anxiety disorder, unspecified: Secondary | ICD-10-CM | POA: Diagnosis not present

## 2023-06-23 DIAGNOSIS — K581 Irritable bowel syndrome with constipation: Secondary | ICD-10-CM | POA: Diagnosis not present

## 2023-06-23 DIAGNOSIS — M419 Scoliosis, unspecified: Secondary | ICD-10-CM | POA: Diagnosis not present

## 2023-06-23 DIAGNOSIS — K219 Gastro-esophageal reflux disease without esophagitis: Secondary | ICD-10-CM | POA: Diagnosis not present

## 2023-06-23 DIAGNOSIS — T8131XA Disruption of external operation (surgical) wound, not elsewhere classified, initial encounter: Secondary | ICD-10-CM | POA: Diagnosis not present

## 2023-06-23 DIAGNOSIS — M47816 Spondylosis without myelopathy or radiculopathy, lumbar region: Secondary | ICD-10-CM | POA: Diagnosis not present

## 2023-06-23 DIAGNOSIS — E039 Hypothyroidism, unspecified: Secondary | ICD-10-CM | POA: Diagnosis not present

## 2023-06-26 DIAGNOSIS — I2510 Atherosclerotic heart disease of native coronary artery without angina pectoris: Secondary | ICD-10-CM | POA: Diagnosis not present

## 2023-06-26 DIAGNOSIS — Z7982 Long term (current) use of aspirin: Secondary | ICD-10-CM | POA: Diagnosis not present

## 2023-06-26 DIAGNOSIS — Z7984 Long term (current) use of oral hypoglycemic drugs: Secondary | ICD-10-CM | POA: Diagnosis not present

## 2023-06-26 DIAGNOSIS — M5416 Radiculopathy, lumbar region: Secondary | ICD-10-CM | POA: Diagnosis not present

## 2023-06-26 DIAGNOSIS — K219 Gastro-esophageal reflux disease without esophagitis: Secondary | ICD-10-CM | POA: Diagnosis not present

## 2023-06-26 DIAGNOSIS — T8131XA Disruption of external operation (surgical) wound, not elsewhere classified, initial encounter: Secondary | ICD-10-CM | POA: Diagnosis not present

## 2023-06-26 DIAGNOSIS — R32 Unspecified urinary incontinence: Secondary | ICD-10-CM | POA: Diagnosis not present

## 2023-06-26 DIAGNOSIS — E039 Hypothyroidism, unspecified: Secondary | ICD-10-CM | POA: Diagnosis not present

## 2023-06-26 DIAGNOSIS — Z87442 Personal history of urinary calculi: Secondary | ICD-10-CM | POA: Diagnosis not present

## 2023-06-26 DIAGNOSIS — M47816 Spondylosis without myelopathy or radiculopathy, lumbar region: Secondary | ICD-10-CM | POA: Diagnosis not present

## 2023-06-26 DIAGNOSIS — G43909 Migraine, unspecified, not intractable, without status migrainosus: Secondary | ICD-10-CM | POA: Diagnosis not present

## 2023-06-26 DIAGNOSIS — E1136 Type 2 diabetes mellitus with diabetic cataract: Secondary | ICD-10-CM | POA: Diagnosis not present

## 2023-06-26 DIAGNOSIS — Z85828 Personal history of other malignant neoplasm of skin: Secondary | ICD-10-CM | POA: Diagnosis not present

## 2023-06-26 DIAGNOSIS — K581 Irritable bowel syndrome with constipation: Secondary | ICD-10-CM | POA: Diagnosis not present

## 2023-06-26 DIAGNOSIS — M419 Scoliosis, unspecified: Secondary | ICD-10-CM | POA: Diagnosis not present

## 2023-06-26 DIAGNOSIS — E7800 Pure hypercholesterolemia, unspecified: Secondary | ICD-10-CM | POA: Diagnosis not present

## 2023-06-26 DIAGNOSIS — F419 Anxiety disorder, unspecified: Secondary | ICD-10-CM | POA: Diagnosis not present

## 2023-06-26 DIAGNOSIS — Z9049 Acquired absence of other specified parts of digestive tract: Secondary | ICD-10-CM | POA: Diagnosis not present

## 2023-06-26 DIAGNOSIS — I10 Essential (primary) hypertension: Secondary | ICD-10-CM | POA: Diagnosis not present

## 2023-06-26 DIAGNOSIS — H9313 Tinnitus, bilateral: Secondary | ICD-10-CM | POA: Diagnosis not present

## 2023-06-26 DIAGNOSIS — M109 Gout, unspecified: Secondary | ICD-10-CM | POA: Diagnosis not present

## 2023-07-03 DIAGNOSIS — E1140 Type 2 diabetes mellitus with diabetic neuropathy, unspecified: Secondary | ICD-10-CM | POA: Diagnosis not present

## 2023-07-09 DIAGNOSIS — T8131XA Disruption of external operation (surgical) wound, not elsewhere classified, initial encounter: Secondary | ICD-10-CM | POA: Diagnosis not present

## 2023-07-09 DIAGNOSIS — M419 Scoliosis, unspecified: Secondary | ICD-10-CM | POA: Diagnosis not present

## 2023-07-09 DIAGNOSIS — I10 Essential (primary) hypertension: Secondary | ICD-10-CM | POA: Diagnosis not present

## 2023-07-09 DIAGNOSIS — E039 Hypothyroidism, unspecified: Secondary | ICD-10-CM | POA: Diagnosis not present

## 2023-07-09 DIAGNOSIS — Z87442 Personal history of urinary calculi: Secondary | ICD-10-CM | POA: Diagnosis not present

## 2023-07-09 DIAGNOSIS — Z7984 Long term (current) use of oral hypoglycemic drugs: Secondary | ICD-10-CM | POA: Diagnosis not present

## 2023-07-09 DIAGNOSIS — K219 Gastro-esophageal reflux disease without esophagitis: Secondary | ICD-10-CM | POA: Diagnosis not present

## 2023-07-09 DIAGNOSIS — Z7982 Long term (current) use of aspirin: Secondary | ICD-10-CM | POA: Diagnosis not present

## 2023-07-09 DIAGNOSIS — Z9049 Acquired absence of other specified parts of digestive tract: Secondary | ICD-10-CM | POA: Diagnosis not present

## 2023-07-09 DIAGNOSIS — Z85828 Personal history of other malignant neoplasm of skin: Secondary | ICD-10-CM | POA: Diagnosis not present

## 2023-07-09 DIAGNOSIS — K581 Irritable bowel syndrome with constipation: Secondary | ICD-10-CM | POA: Diagnosis not present

## 2023-07-09 DIAGNOSIS — F419 Anxiety disorder, unspecified: Secondary | ICD-10-CM | POA: Diagnosis not present

## 2023-07-09 DIAGNOSIS — G43909 Migraine, unspecified, not intractable, without status migrainosus: Secondary | ICD-10-CM | POA: Diagnosis not present

## 2023-07-09 DIAGNOSIS — I2510 Atherosclerotic heart disease of native coronary artery without angina pectoris: Secondary | ICD-10-CM | POA: Diagnosis not present

## 2023-07-09 DIAGNOSIS — M109 Gout, unspecified: Secondary | ICD-10-CM | POA: Diagnosis not present

## 2023-07-09 DIAGNOSIS — E1136 Type 2 diabetes mellitus with diabetic cataract: Secondary | ICD-10-CM | POA: Diagnosis not present

## 2023-07-09 DIAGNOSIS — M5416 Radiculopathy, lumbar region: Secondary | ICD-10-CM | POA: Diagnosis not present

## 2023-07-09 DIAGNOSIS — E7800 Pure hypercholesterolemia, unspecified: Secondary | ICD-10-CM | POA: Diagnosis not present

## 2023-07-09 DIAGNOSIS — R32 Unspecified urinary incontinence: Secondary | ICD-10-CM | POA: Diagnosis not present

## 2023-07-09 DIAGNOSIS — M47816 Spondylosis without myelopathy or radiculopathy, lumbar region: Secondary | ICD-10-CM | POA: Diagnosis not present

## 2023-07-09 DIAGNOSIS — H9313 Tinnitus, bilateral: Secondary | ICD-10-CM | POA: Diagnosis not present

## 2023-07-14 DIAGNOSIS — M79604 Pain in right leg: Secondary | ICD-10-CM | POA: Diagnosis not present

## 2023-07-14 DIAGNOSIS — Z8669 Personal history of other diseases of the nervous system and sense organs: Secondary | ICD-10-CM | POA: Diagnosis not present

## 2023-07-14 DIAGNOSIS — M791 Myalgia, unspecified site: Secondary | ICD-10-CM | POA: Diagnosis not present

## 2023-08-20 DIAGNOSIS — M5416 Radiculopathy, lumbar region: Secondary | ICD-10-CM | POA: Diagnosis not present

## 2023-08-23 DIAGNOSIS — M5416 Radiculopathy, lumbar region: Secondary | ICD-10-CM | POA: Diagnosis not present

## 2023-09-02 ENCOUNTER — Other Ambulatory Visit: Payer: Self-pay | Admitting: Registered Nurse

## 2023-09-02 DIAGNOSIS — E039 Hypothyroidism, unspecified: Secondary | ICD-10-CM

## 2023-09-02 DIAGNOSIS — T8130XA Disruption of wound, unspecified, initial encounter: Secondary | ICD-10-CM | POA: Diagnosis not present

## 2023-09-03 ENCOUNTER — Ambulatory Visit
Admission: RE | Admit: 2023-09-03 | Discharge: 2023-09-03 | Disposition: A | Discharge status: Home / Self Care | Source: Ambulatory Visit | Attending: Registered Nurse | Admitting: Registered Nurse

## 2023-09-03 DIAGNOSIS — E039 Hypothyroidism, unspecified: Secondary | ICD-10-CM

## 2023-09-03 DIAGNOSIS — M419 Scoliosis, unspecified: Secondary | ICD-10-CM | POA: Diagnosis not present

## 2023-09-18 ENCOUNTER — Ambulatory Visit: Admitting: Dermatology

## 2023-09-22 ENCOUNTER — Ambulatory Visit: Admitting: Dermatology

## 2023-09-22 DIAGNOSIS — R110 Nausea: Secondary | ICD-10-CM | POA: Diagnosis not present

## 2023-09-22 DIAGNOSIS — K5792 Diverticulitis of intestine, part unspecified, without perforation or abscess without bleeding: Secondary | ICD-10-CM | POA: Diagnosis not present

## 2023-09-22 DIAGNOSIS — R140 Abdominal distension (gaseous): Secondary | ICD-10-CM | POA: Diagnosis not present

## 2023-09-23 ENCOUNTER — Telehealth: Payer: Self-pay | Admitting: Gastroenterology

## 2023-09-23 NOTE — Telephone Encounter (Signed)
 Pt scheduled to see Alan Coombs PA tommorow at 10:40am. Pt aware of appt.

## 2023-09-23 NOTE — Telephone Encounter (Signed)
 [facility] called on behave of this patient and stated that her PCP is requesting that she been seen this week. I advised them unfortunately the soonest we have is what they patient already has. Patient PCP office would like to speak to the nurse in regards to this. Best contact number is [phone] rep who I spoke to was named Darice. Please advise.    FYI patient is schedule for August the 12 th.

## 2023-09-23 NOTE — Progress Notes (Unsigned)
 09/24/2023 Jillian [name] [id number] 04/23/1945  Referring provider: Shepard Ade, MD Primary GI doctor: Dr. Abran  ASSESSMENT AND PLAN:  Abdominal bloating/gas, diarrhea, nausea without vomiting, sweating No blood in the stool X 1 week, fatigue, sleeping, this has improved without medication Never formed stools, has watery stools and days without a BM Likely infectious, if returns consider stools studies and inflammatory markers and KUB Possibly pSBO with some of the symptoms, soft AB on exam, continue miralax   Constipation with urinary incontinence 08/12/2022 CTAP extensive diverticulosis no signs of inflammation, mild right 7 mm stone within right renal pelvis, lumbar scoliosis, DJD bilateral hips Linzess  72 cause diarrhea Currently on MiraLAX  2 capfuls daily Not on a fiber supplement Possible pelvic floor - continue miralax  BID - consider pelvic PT referral  Screening colonoscopy Colonoscopy [place] 2022 showed only diverticulosis with rectosigmoid stenosis.   CAD S/p cath 10/2022 EF 55-65%, LAD stenosis not significant  Patient Care Team: Shepard Ade, MD as PCP - General (Internal Medicine) O'Neal, Darryle Ned, MD as PCP - Cardiology (Cardiology) Yvone Rush, MD as Consulting Physician (Orthopedic Surgery) Cesario Boer, MD as Attending Physician (Physical Medicine and Rehabilitation)  HISTORY OF PRESENT ILLNESS: 78 y.o. female with a past medical history listed below presents for evaluation of bloating/diarrhea.   Last seen in the office 08/07/2022 by Harlene PA for abdominal pain, constipation, bloating.  Discussed the use of AI scribe software for clinical note transcription with the patient, who gave verbal consent to proceed.  History of Present Illness   Jillian [name] is a 78 year old female with diverticulosis who presents with severe gastrointestinal symptoms including gas and diarrhea.  She has a history of diverticulosis, diagnosed  via colonoscopy in [place] 2022, which revealed diverticula in the sigmoid colon and narrowing in the rectosigmoid area. She experiences alternating constipation and diarrhea, managed with Miralax , taking two capfuls in her coffee with variable efficacy.  Recently, she experienced severe gastrointestinal symptoms, including intense gas described as 'a baby kicking inside,' explosive diarrhea, nausea, and sweating. These symptoms began after social interaction and persisted for several days, causing extreme fatigue and increased sleep. COVID and flu tests were negative, and blood tests showed no infection. No stool studies were performed.  Her bowel movements have been watery and frequent, with two small movements yesterday and none today. She reports improvement in gas symptoms, previously loud enough to be heard across a room. No blood in stool.  She has a history of urinary incontinence and expresses concern about her family history, as her mother died from gangrene due to a blocked artery to the intestines.  She has tried various treatments for bowel issues, including Miralax  with adjusted dosage, and previously tried Linzess , which caused severe diarrhea. She has not used fiber supplements like Benefiber or Citrucel, believing Miralax  would suffice. She reports a history of sciatica following surgery in March.   She  reports that she has never smoked. She has never used smokeless tobacco. She reports current alcohol  use. She reports that she does not use drugs.  RELEVANT GI HISTORY, IMAGING AND LABS: Results   LABS Hemoglobin: 7 g/dL  RADIOLOGY Chest X-ray: Normal  DIAGNOSTIC Colonoscopy: Diverticulosis and narrowing in the rectosigmoid colon (07/2020)      CBC    Component Value Date/Time   WBC 17.0 (H) 04/30/2023 0345   RBC 3.76 (L) 04/30/2023 0345   HGB 10.9 (L) 04/30/2023 0345   HCT 35.1 (L) 04/30/2023 0345   PLT 252  04/30/2023 0345   MCV 93.4 04/30/2023 0345   MCH 29.0  04/30/2023 0345   MCHC 31.1 04/30/2023 0345   RDW 14.1 04/30/2023 0345   Recent Labs    10/29/22 1219 10/29/22 2229 10/30/22 0111 12/13/22 0944 04/21/23 1111 04/29/23 0343 04/30/23 0345  HGB 12.3 12.9 11.6* 13.2 13.3 11.4* 10.9*    CMP     Component Value Date/Time   NA 136 04/29/2023 0343   K 4.6 04/29/2023 0343   CL 104 04/29/2023 0343   CO2 22 04/29/2023 0343   GLUCOSE 159 (H) 04/29/2023 0343   BUN 15 04/29/2023 0343   CREATININE 0.95 04/29/2023 0343   CALCIUM  9.3 04/29/2023 0343   PROT 7.0 10/30/2022 0111   PROT 7.5 07/09/2019 1025   ALBUMIN 3.9 10/30/2022 0111   AST 24 10/30/2022 0111   ALT 13 10/30/2022 0111   ALKPHOS 60 10/30/2022 0111   BILITOT 0.9 10/30/2022 0111   GFRNONAA >60 04/29/2023 0343   GFRAA >60 11/26/2018 0737      Latest Ref Rng & Units 10/30/2022    1:11 AM 07/09/2019   10:25 AM 06/26/2017    3:48 PM  Hepatic Function  Total Protein 6.5 - 8.1 g/dL 7.0  7.5  8.4   Albumin 3.5 - 5.0 g/dL 3.9   4.3   AST 15 - 41 U/L 24   24   ALT 0 - 44 U/L 13   16   Alk Phosphatase 38 - 126 U/L 60   88   Total Bilirubin 0.3 - 1.2 mg/dL 0.9   1.1       Current Medications:   Current Outpatient Medications (Endocrine & Metabolic):    EUTHYROX  137 MCG tablet, Take 137 mcg by mouth daily before breakfast.   metFORMIN (GLUCOPHAGE-XR) 500 MG 24 hr tablet, Take 1,000 mg by mouth 2 (two) times daily.  Current Outpatient Medications (Cardiovascular):    furosemide (LASIX) 40 MG tablet, Take 40 mg by mouth as needed.   rosuvastatin  (CRESTOR ) 20 MG tablet, Take 20 mg by mouth daily.   telmisartan (MICARDIS) 80 MG tablet, Take 80 mg by mouth daily before breakfast.  Current Outpatient Medications (Respiratory):    cetirizine (ZYRTEC) 10 MG tablet, Take 10 mg by mouth daily.   fexofenadine (ALLEGRA) 180 MG tablet, Take 180 mg by mouth daily as needed for allergies or rhinitis.    Current Outpatient Medications (Other):    ALPRAZolam (XANAX) 0.25 MG tablet, Take  0.25 mg by mouth as needed.   cyclobenzaprine  (FLEXERIL ) 10 MG tablet, Take 10 mg by mouth 3 (three) times daily as needed for muscle spasms.   famotidine (PEPCID) 20 MG tablet, Take 20 mg by mouth as needed.   magnesium  hydroxide (MILK OF MAGNESIA) 400 MG/5ML suspension, Take 15 mLs by mouth daily as needed for moderate constipation.   Menthol -Zinc Oxide (CALMOSEPTINE) 0.44-20.6 % OINT, Apply topically as needed.   miconazole  (MICOTIN) 2 % powder, Apply topically as needed for itching.   omeprazole  (PRILOSEC OTC) 20 MG tablet, Take 20 mg by mouth daily.   ondansetron  (ZOFRAN ) 4 MG tablet, Take 1 tablet (4 mg total) by mouth every 6 (six) hours as needed for nausea.   polyethylene glycol powder (GLYCOLAX /MIRALAX ) 17 GM/SCOOP powder, Take 1 dose once to twice daily by mouth in 8 ounces of fluid (Patient taking differently: Take 34 g by mouth daily as needed for moderate constipation.)   Simethicone (GAS-X PO), Take by mouth.  Medical History:  Past Medical History:  Diagnosis  Date   Allergy     Anxiety    Bowel obstruction (HCC)    Cancer (HCC)    skin cancer had Mohs Surgery   Cataract    Coronary artery disease    Diabetes mellitus without complication (HCC)    Diverticulitis    GERD (gastroesophageal reflux disease)    Gout    H/O unstable angina    Headache    High cholesterol    History of kidney stones    Hypertension    Hypothyroidism    IBS (irritable bowel syndrome)    Neck pain    PONV (postoperative nausea and vomiting)    Renovascular hypertension    Spondylosis of lumbar spine    Allergies:  Allergies  Allergen Reactions   Hydrocodone-Acetaminophen  Itching   Nortriptyline Anxiety and Palpitations   Oxycodone  Itching   Metronidazole Nausea And Vomiting    Flu like symptoms    Sulfamethoxazole Other (See Comments)    Flu-like symptoms.   Amoxicillin     Facial Numbness   Chlorhexidine  Itching    Redness Burning   Luminal [Phenobarbital] Other (See  Comments)    Blisters   Mobic [Meloxicam]    Onion     Raw onion causes headaches    Penicillins     Unknown reaction Tolerated Cephalosporin Date: 04/29/23.     Allopurinol Rash   Ciprofloxacin Rash   Doxycycline Rash   Iodinated Contrast Media Rash   Latex Rash   Tape Rash    Adhesive tape   Toradol [Ketorolac Tromethamine] Palpitations    sweating     Surgical History:  She  has a past surgical history that includes Abdominal hysterectomy; Tonsillectomy; Hernia repair; Cholecystectomy; Cardiac catheterization; Wrist fracture surgery (Right); Appendectomy; Radiofrequency ablation nerves (03/2020); Exploratory laparotomy; Breast biopsy; LEFT HEART CATH AND CORONARY ANGIOGRAPHY (N/A, 10/30/2022); CORONARY PRESSURE/FFR STUDY (N/A, 10/30/2022); Bladder surgery; Wrist ganglion excision (Right); Eye surgery (Bilateral); and Total hip arthroplasty (Left, 04/28/2023). Family History:  Her family history includes Breast cancer in her maternal aunt; Colon cancer in her paternal grandmother; Heart attack in her father.  REVIEW OF SYSTEMS  : All other systems reviewed and negative except where noted in the History of Present Illness.  PHYSICAL EXAM: BP 138/74   Pulse 73   Ht 5' 3 (1.6 m)   Wt 178 lb (80.7 kg)   BMI 31.53 kg/m  Physical Exam   GENERAL APPEARANCE: Well nourished, in no apparent distress HEENT: No cervical lymphadenopathy, unremarkable thyroid , sclerae anicteric, conjunctiva pink RESPIRATORY: Respiratory effort normal, BS equal bilateral without rales, rhonchi, wheezing CARDIO: RRR with no MRGs, peripheral pulses intact ABDOMEN: Soft, non distended, active bowel sounds in all 4 quadrants, no tenderness to palpation, no rebound, no mass appreciated RECTAL: declines MUSCULOSKELETAL: Full ROM, normal gait, without edema SKIN: Dry, intact without rashes or lesions. No jaundice. NEURO: Alert, oriented, no focal deficits PSYCH: Cooperative, normal mood and affect.       Alan JONELLE Coombs, PA-C 11:46 AM

## 2023-09-24 ENCOUNTER — Ambulatory Visit: Admitting: Physician Assistant

## 2023-09-24 ENCOUNTER — Encounter: Payer: Self-pay | Admitting: Physician Assistant

## 2023-09-24 VITALS — BP 138/74 | HR 73 | Ht 63.0 in | Wt 178.0 lb

## 2023-09-24 DIAGNOSIS — E119 Type 2 diabetes mellitus without complications: Secondary | ICD-10-CM

## 2023-09-24 DIAGNOSIS — R32 Unspecified urinary incontinence: Secondary | ICD-10-CM

## 2023-09-24 DIAGNOSIS — Z8719 Personal history of other diseases of the digestive system: Secondary | ICD-10-CM

## 2023-09-24 DIAGNOSIS — R197 Diarrhea, unspecified: Secondary | ICD-10-CM | POA: Diagnosis not present

## 2023-09-24 DIAGNOSIS — K5790 Diverticulosis of intestine, part unspecified, without perforation or abscess without bleeding: Secondary | ICD-10-CM

## 2023-09-24 DIAGNOSIS — K5900 Constipation, unspecified: Secondary | ICD-10-CM | POA: Diagnosis not present

## 2023-09-24 DIAGNOSIS — R140 Abdominal distension (gaseous): Secondary | ICD-10-CM

## 2023-09-24 DIAGNOSIS — R194 Change in bowel habit: Secondary | ICD-10-CM

## 2023-09-24 DIAGNOSIS — I2510 Atherosclerotic heart disease of native coronary artery without angina pectoris: Secondary | ICD-10-CM

## 2023-09-24 DIAGNOSIS — R110 Nausea: Secondary | ICD-10-CM | POA: Diagnosis not present

## 2023-09-24 DIAGNOSIS — R1084 Generalized abdominal pain: Secondary | ICD-10-CM

## 2023-09-24 NOTE — Progress Notes (Signed)
 Noted

## 2023-09-24 NOTE — Patient Instructions (Addendum)
 Miralax  is an osmotic laxative.  It only brings more water  into the stool.  This is safe to take daily.  Can take up to 17 gram of miralax  twice a day.  Mix with juice or coffee.  Start 1 capful at night for 3-4 days and reassess your response in 3-4 days.  You can increase and decrease the dose based on your response.  Remember, it can take up to 3-4 days to take effect OR for the effects to wear off.   FIBER SUPPLEMENT Try the Citracel.  Fiber is good for constipation/diarrhea/irritable bowel syndrome.  It can also help with weight loss and can help lower your bad cholesterol (LDL).  Please do 1 TBSP in the morning in water , coffee, or tea.  It can take up to a month before you can see a difference with your bowel movements.  It is cheapest from [employer], [employer], [employer].   Here some information about pelvic floor dysfunction. This may be contributing to some of your symptoms. We will continue with our evaluation but I do want you to consider adding on fiber supplement with low-dose MiraLAX  daily. We could also refer to pelvic floor physical therapy.  I USUALLY SEND PEOPLE TO [facility]   Pelvic Floor Dysfunction, Female Pelvic floor dysfunction (PFD) is a condition that results when the group of muscles and connective tissues that support the organs in the pelvis (pelvic floor muscles) do not work well. These muscles and their connections form a sling that supports the colon and bladder. In women, they also support the uterus. PFD causes pelvic floor muscles to be too weak, too tight, or both. In PFD, muscle movements are not coordinated. This may cause bowel or bladder problems. It may also cause pain. What are the causes? This condition may be caused by an injury to the pelvic area or by a weakening of pelvic muscles. This often results from pregnancy and childbirth or other types of strain. In many cases, the exact cause is not known. What increases the  risk? The following factors may make you more likely to develop this condition: Having chronic bladder tissue inflammation (interstitial cystitis). Being an older person. Being overweight. History of radiation treatment for cancer in the pelvic region. Previous pelvic surgery, such as removal of the uterus (hysterectomy). What are the signs or symptoms? Symptoms of this condition vary and may include: Bladder symptoms, such as: Trouble starting urination and emptying the bladder. Frequent urinary tract infections. Leaking urine when coughing, laughing, or exercising (stress incontinence). Having to pass urine urgently or frequently. Pain when passing urine. Bowel symptoms, such as: Constipation. Urgent or frequent bowel movements. Incomplete bowel movements. Painful bowel movements. Leaking stool or gas. Unexplained genital or rectal pain. Genital or rectal muscle spasms. Low back pain. Other symptoms may include: A heavy, full, or aching feeling in the vagina. A bulge that protrudes into the vagina. Pain during or after sex. How is this diagnosed? This condition may be diagnosed based on: Your symptoms and medical history. A physical exam. During the exam, your health care provider may check your pelvic muscles for tightness, spasm, pain, or weakness. This may include a rectal exam and a pelvic exam. In some cases, you may have diagnostic tests, such as: Electrical muscle function tests. Urine flow testing. X-ray tests of bowel function. Ultrasound of the pelvic organs. How is this treated? Treatment for this condition depends on the symptoms. Treatment options include: Physical therapy. This may include  Kegel exercises to help relax or strengthen the pelvic floor muscles. Biofeedback. This type of therapy provides feedback on how tight your pelvic floor muscles are so that you can learn to control them. Internal or external massage therapy. A treatment that involves  electrical stimulation of the pelvic floor muscles to help control pain (transcutaneous electrical nerve stimulation, or TENS). Sound wave therapy (ultrasound) to reduce muscle spasms. Medicines, such as: Muscle relaxants. Bladder control medicines. Surgery to reconstruct or support pelvic floor muscles may be an option if other treatments do not help. Follow these instructions at home: Activity Do your usual activities as told by your health care provider. Ask your health care provider if you should modify any activities. Do pelvic floor strengthening or relaxing exercises at home as told by your physical therapist. Lifestyle Maintain a healthy weight. Eat foods that are high in fiber, such as beans, whole grains, and fresh fruits and vegetables. Limit foods that are high in fat and processed sugars, such as fried or sweet foods. Manage stress with relaxation techniques such as yoga or meditation. General instructions If you have problems with leakage: Use absorbable pads or wear padded underwear. Wash frequently with mild soap. Keep your genital and anal area as clean and dry as possible. Ask your health care provider if you should try a barrier cream to prevent skin irritation. Take warm baths to relieve pelvic muscle tension or spasms. Take over-the-counter and prescription medicines only as told by your health care provider. Keep all follow-up visits. How is this prevented? The cause of PFD is not always known, but there are a few things you can do to reduce the risk of developing this condition, including: Staying at a healthy weight. Getting regular exercise. Managing stress. Contact a health care provider if: Your symptoms are not improving with home care. You have signs or symptoms of PFD that get worse at home. You develop new signs or symptoms. You have signs of a urinary tract infection, such as: Fever. Chills. Increased urinary frequency. A burning feeling when  urinating. You have not had a bowel movement in 3 days (constipation). Summary Pelvic floor dysfunction results when the muscles and connective tissues in your pelvic floor do not work well. These muscles and their connections form a sling that supports your colon and bladder. In women, they also support the uterus. PFD may be caused by an injury to the pelvic area or by a weakening of pelvic muscles. PFD causes pelvic floor muscles to be too weak, too tight, or a combination of both. Symptoms may vary from person to person. In most cases, PFD can be treated with physical therapies and medicines. Surgery may be an option if other treatments do not help. This information is not intended to replace advice given to you by your health care provider. Make sure you discuss any questions you have with your health care provider. Document Revised: 06/21/2020 Document Reviewed: 06/21/2020 Elsevier Patient Education  2022 Else[employer] Inc.  You may have POST INFECTIOUS IBS OR IRRITABLE BOWEL After an infection or diverticulitis flare your intestines can spasm or be a little bit more sensitive. Try these things below:  Can DO low FODMAP- see below Try trial off milk/lactose products.  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    FODMAP stands for fermentable oligo-, di-, mono-saccharides and polyols (1). These are the scientific terms used to classify groups of carbs that are notorious for triggering digestive symptoms like bloating, gas and stomach pain.     I appreciate the  opportunity to care for you  Thank You   [facility]

## 2023-10-07 ENCOUNTER — Ambulatory Visit: Admitting: Physician Assistant

## 2023-10-09 ENCOUNTER — Ambulatory Visit

## 2023-10-09 DIAGNOSIS — D1801 Hemangioma of skin and subcutaneous tissue: Secondary | ICD-10-CM

## 2023-10-09 DIAGNOSIS — L820 Inflamed seborrheic keratosis: Secondary | ICD-10-CM

## 2023-10-09 DIAGNOSIS — Z85828 Personal history of other malignant neoplasm of skin: Secondary | ICD-10-CM

## 2023-10-09 DIAGNOSIS — L900 Lichen sclerosus et atrophicus: Secondary | ICD-10-CM | POA: Diagnosis not present

## 2023-10-09 DIAGNOSIS — L814 Other melanin hyperpigmentation: Secondary | ICD-10-CM

## 2023-10-09 DIAGNOSIS — L821 Other seborrheic keratosis: Secondary | ICD-10-CM | POA: Diagnosis not present

## 2023-10-09 DIAGNOSIS — L738 Other specified follicular disorders: Secondary | ICD-10-CM

## 2023-10-09 DIAGNOSIS — C4490 Unspecified malignant neoplasm of skin, unspecified: Secondary | ICD-10-CM

## 2023-10-09 NOTE — Progress Notes (Signed)
 New Patient Visit   Subjective  Jillian [name] is a 78 y.o. female who presents for the following: hx of BCC on nose several years ago. Has a spot on her forehead and left cheek that she is concerned about. Patient also complains of a rash on her lower abdomen since last summer has been treated by PCP and another dermatologist; she was given Nystatin, Vaseline, and an antifungal cream. Rash worsened after having hip surgery on 04/28/23. Has used clobetasol under abdomen for several months. Has had the most improvement recently with ciclopirox and zeozorb.  Also with Hx of vulvar lichen sclerosus - used Clobetasol in the past but was told by another dermatologist to discontinue. States had biopsy in past c/w LS from [facility]. Currently using ciclopirox. Feels stable with some minor irritation.   The following portions of the chart were reviewed this encounter and updated as appropriate: medications, allergies, medical history  Review of Systems:  No other skin or systemic complaints except as noted in HPI or Assessment and Plan.  Objective  Well appearing patient in no apparent distress; mood and affect are within normal limits.  A focused examination was performed of the following areas: Face, abdomen, vulva   Relevant exam findings are noted in the Assessment and Plan.  L cheek, R forehead (2) Stuck on waxy paps with erythema  Limited vulvar exam due to patient discomfort, did not want full gown exam today. Mild erythema of vulva.   Assessment & Plan   HISTORY OF BASAL CELL CARCINOMA OF THE SKIN Nose - No evidence of recurrence today - Recommend regular full body skin exams - Recommend daily broad spectrum sunscreen SPF 30+ to sun-exposed areas, reapply every 2 hours as needed.  - Call if any new or changing lesions are noted between office visits  - Encouraged FBSE in next 2-3 months   Benign Findings - Sebaceous Hyperplasia  - Lentigines - Seborrheic  Keratosis  - Angiomas  - Benign-appearing - Observe. Call for changes.   LICHEN SCLEROSUS ET ATROPHICUS Vaginal area Exam: Limited vulvar exam due to patient discomfort, did not want full gown exam today. Mild erythema of vulva.  - Encouraged FBSE with gown in next 2-3 months   Chronic and persistent condition with duration or expected duration over one year. Condition is symptomatic/ bothersome to patient. Not currently at goal.   Lichen sclerosus is a chronic inflammatory condition of unknown cause that frequently involves the vaginal area and less commonly extragenital skin, and is NOT sexually transmitted. It frequently causes symptoms of pain and burning.  It requires regular monitoring and treatment with topical steroids to minimize inflammation and to reduce risk of scarring. There is also a risk of cancer in the vaginal area which is very low if inflammation is well controlled. Regular checks of the area are recommended. Please call if you notice any new or changing spots within this area.  Treatment Plan: Encouraged clobetasol 0.05% ointment three times weekly for maintenance. Can increase for flares  Start using Vasoline on the vulvar area as needed for irritation.  INFLAMED SEBORRHEIC KERATOSIS (2) L cheek, R forehead (2) Symptomatic, irritating, patient would like treated.  Destruction of lesion - L cheek, R forehead (2)  Destruction method: cryotherapy   Informed consent: discussed and consent obtained   Lesion destroyed using liquid nitrogen: Yes   Region frozen until ice ball extended beyond lesion: Yes   Outcome: patient tolerated procedure well with no complications   Post-procedure  details: wound care instructions given   Additional details:  Prior to procedure, discussed risks of blister formation, small wound, skin dyspigmentation, or rare scar following cryotherapy. Recommend Vaseline ointment to treated areas while healing.    Return in about 6 weeks (around  11/20/2023) for TBSE, LS&A.  I, Emerick Ege, CMA am acting as scribe for Lauraine JAYSON Kanaris, MD.    Documentation: I have reviewed the above documentation for accuracy and completeness, and I agree with the above.  Lauraine JAYSON Kanaris, MD

## 2023-10-09 NOTE — Patient Instructions (Addendum)
 Continue clobetasol ointment three times weekly for maintenance. Can increase to twice daily during flares.  Start using Vasoline on the vulvar area as needed for irritation.    Cryotherapy Aftercare  Wash gently with soap and water everyday.   Apply Vaseline and Band-Aid daily until healed.    Due to recent changes in healthcare laws, you may see results of your pathology and/or laboratory studies on MyChart before the doctors have had a chance to review them. We understand that in some cases there may be results that are confusing or concerning to you. Please understand that not all results are received at the same time and often the doctors may need to interpret multiple results in order to provide you with the best plan of care or course of treatment. Therefore, we ask that you please give us  2 business days to thoroughly review all your results before contacting the office for clarification. Should we see a critical lab result, you will be contacted sooner.   If You Need Anything After Your Visit  If you have any questions or concerns for your doctor, please call our main line at [phone] and press option 4 to reach your doctor's medical assistant. If no one answers, please leave a voicemail as directed and we will return your call as soon as possible. Messages left after 4 pm will be answered the following business day.   You may also send us  a message via MyChart. We typically respond to MyChart messages within 1-2 business days.  For prescription refills, please ask your pharmacy to contact our office. Our fax number is [phone].  If you have an urgent issue when the clinic is closed that cannot wait until the next business day, you can page your doctor at the number below.    Please note that while we do our best to be available for urgent issues outside of office hours, we are not available 24/7.   If you have an urgent issue and are unable to reach us , you may choose to  seek medical care at your doctor's office, retail clinic, urgent care center, or emergency room.  If you have a medical emergency, please immediately call 911 or go to the emergency department.  Pager Numbers  - Dr. Hester: [phone]  - Dr. Jackquline: [phone]  - Dr. Claudene: [phone]   In the event of inclement weather, please call our main line at [phone] for an update on the status of any delays or closures.  Dermatology Medication Tips: Please keep the boxes that topical medications come in in order to help keep track of the instructions about where and how to use these. Pharmacies typically print the medication instructions only on the boxes and not directly on the medication tubes.   If your medication is too expensive, please contact our office at [phone] option 4 or send us  a message through MyChart.   We are unable to tell what your co-pay for medications will be in advance as this is different depending on your insurance coverage. However, we may be able to find a substitute medication at lower cost or fill out paperwork to get insurance to cover a needed medication.   If a prior authorization is required to get your medication covered by your insurance company, please allow us  1-2 business days to complete this process.  Drug prices often vary depending on where the prescription is filled and some pharmacies may offer cheaper prices.  The website www.goodrx.com contains coupons for medications  through different pharmacies. The prices here do not account for what the cost may be with help from insurance (it may be cheaper with your insurance), but the website can give you the price if you did not use any insurance.  - You can print the associated coupon and take it with your prescription to the pharmacy.  - You may also stop by our office during regular business hours and pick up a GoodRx coupon card.  - If you need your prescription sent electronically to a  different pharmacy, notify our office through [facility] or by phone at [phone] option 4.     Si Usted Necesita Algo Despus de Su Visita  Tambin puede enviarnos un mensaje a travs de Clinical cytogeneticist. Por lo general respondemos a los mensajes de MyChart en el transcurso de 1 a 2 das hbiles.  Para renovar recetas, por favor pida a su farmacia que se ponga en contacto con nuestra oficina. Randi lakes de fax es [place] [phone].  Si tiene un asunto urgente cuando la clnica est cerrada y que no puede esperar hasta el siguiente da hbil, puede llamar/localizar a su doctor(a) al nmero que aparece a continuacin.   Por favor, tenga en cuenta que aunque hacemos todo lo posible para estar disponibles para asuntos urgentes fuera del horario de [place], no estamos disponibles las 24 horas del da, los 7 [address]  [address] de la [place].   Si tiene un problema urgente y no puede comunicarse con nosotros, puede optar por buscar atencin mdica  en el consultorio de su doctor(a), en una clnica privada, en un centro de atencin urgente o en una sala de emergencias.  Si tiene Engineer, drilling, por favor llame inmediatamente al 911 o vaya a la sala de emergencias.  Nmeros de bper  - Dr. Hester: [phone]  - Dra. Jackquline: [phone]  - Dr. Claudene: [phone]   En caso de inclemencias del tiempo, por favor llame a landry capes principal al [phone] para una actualizacin sobre el [place] de cualquier retraso o cierre.  Consejos para la medicacin en dermatologa: Por favor, guarde las cajas en las que vienen los medicamentos de uso tpico para ayudarle a seguir las instrucciones sobre dnde y cmo usarlos. Las farmacias generalmente imprimen las instrucciones del medicamento slo en las cajas y no directamente en los tubos del [place].   Si su medicamento es muy caro, por favor, pngase en contacto con landry rieger llamando al [phone] y presione la opcin 4 o envenos un  mensaje a travs de Clinical cytogeneticist.   No podemos decirle cul ser su copago por los medicamentos por adelantado ya que esto es diferente dependiendo de la cobertura de su seguro. Sin embargo, es posible que podamos encontrar un medicamento sustituto a Audiological scientist un formulario para que el seguro cubra el medicamento que se considera necesario.   Si se requiere una autorizacin previa para que su compaa de seguros malta su medicamento, por favor permtanos de 1 a 2 das hbiles para completar este proceso.  Los precios de los medicamentos varan con frecuencia dependiendo del Environmental consultant de dnde se surte la receta y alguna farmacias pueden ofrecer precios ms baratos.  El sitio web www.goodrx.com tiene cupones para medicamentos de Health and safety inspector. Los precios aqu no tienen en cuenta lo que podra costar con la ayuda del seguro (puede ser ms barato con su seguro), pero el sitio web puede darle el precio si no utiliz Tourist information centre manager.  - Puede imprimir el cupn correspondiente y  llevarlo con su receta a la farmacia.  - Tambin puede pasar por nuestra oficina durante el horario de atencin regular y Education officer, museum una tarjeta de cupones de GoodRx.  - Si necesita que su receta se enve electrnicamente a una farmacia diferente, informe a nuestra oficina a travs de MyChart de [place] o por telfono llamando al [phone] y presione la opcin 4.

## 2023-10-16 DIAGNOSIS — R058 Other specified cough: Secondary | ICD-10-CM | POA: Diagnosis not present

## 2023-10-16 DIAGNOSIS — R0981 Nasal congestion: Secondary | ICD-10-CM | POA: Diagnosis not present

## 2023-10-16 DIAGNOSIS — U071 COVID-19: Secondary | ICD-10-CM | POA: Diagnosis not present

## 2023-10-22 DIAGNOSIS — E1140 Type 2 diabetes mellitus with diabetic neuropathy, unspecified: Secondary | ICD-10-CM | POA: Diagnosis not present

## 2023-10-22 DIAGNOSIS — R058 Other specified cough: Secondary | ICD-10-CM | POA: Diagnosis not present

## 2023-10-22 DIAGNOSIS — Z1212 Encounter for screening for malignant neoplasm of rectum: Secondary | ICD-10-CM | POA: Diagnosis not present

## 2023-10-22 DIAGNOSIS — U071 COVID-19: Secondary | ICD-10-CM | POA: Diagnosis not present

## 2023-10-22 DIAGNOSIS — M109 Gout, unspecified: Secondary | ICD-10-CM | POA: Diagnosis not present

## 2023-10-22 DIAGNOSIS — E039 Hypothyroidism, unspecified: Secondary | ICD-10-CM | POA: Diagnosis not present

## 2023-10-22 DIAGNOSIS — R82998 Other abnormal findings in urine: Secondary | ICD-10-CM | POA: Diagnosis not present

## 2023-10-29 DIAGNOSIS — R059 Cough, unspecified: Secondary | ICD-10-CM | POA: Diagnosis not present

## 2023-10-29 DIAGNOSIS — Z1331 Encounter for screening for depression: Secondary | ICD-10-CM | POA: Diagnosis not present

## 2023-10-29 DIAGNOSIS — J029 Acute pharyngitis, unspecified: Secondary | ICD-10-CM | POA: Diagnosis not present

## 2023-10-29 DIAGNOSIS — U099 Post covid-19 condition, unspecified: Secondary | ICD-10-CM | POA: Diagnosis not present

## 2023-10-29 DIAGNOSIS — Z1339 Encounter for screening examination for other mental health and behavioral disorders: Secondary | ICD-10-CM | POA: Diagnosis not present

## 2023-10-29 DIAGNOSIS — Z0000 Encounter for general adult medical examination without abnormal findings: Secondary | ICD-10-CM | POA: Diagnosis not present

## 2023-11-19 ENCOUNTER — Ambulatory Visit: Admitting: Physician Assistant

## 2023-11-19 NOTE — Progress Notes (Unsigned)
 11/20/2023 [name] [id number] Aug 03, 1945  Referring provider: Shepard Ade, MD Primary GI doctor: Dr. Abran  ASSESSMENT AND PLAN:  Abdominal bloating/gas, diarrhea, nausea without vomiting, decreased appetite, 10 lbs weight loss since March, symptoms are waxing and weaning No blood in the stool -Get Sed rate, CRP, lipase, CBC, CMET - stat KUB to evaluate for obstruction, possible constipation with decreased movement -Will schedule for CT AB and pelvis with contrast to evaluate further, Prednisone  50 mg at 13 hours prior to procedure, 7 hours prior to procedure, and 1 hour prior to procedure. Benadryl  50 mg 1 hour prior to procedure.   -Consider SIBO testing, patient has several allergies to ABX - consider pelvic floor PT, information given  Constipation with urinary incontinence 08/12/2022 CTAP extensive diverticulosis no signs of inflammation, mild right 7 mm stone within right renal pelvis, lumbar scoliosis, DJD bilateral hips Linzess  72 cause diarrhea Currently on MiraLAX  2 capfuls daily but does not help all the time - continue miralax  BID - get KUB, consider bowel purge with amitiza 8 mcg - consider pelvic PT referral  Screening colonoscopy Colonoscopy [facility] 2022 showed only diverticulosis with rectosigmoid stenosis.   CAD S/p cath 10/2022 EF 55-65%, LAD stenosis not significant  Patient Care Team: Shepard Ade, MD as PCP - General (Internal Medicine) O'Neal, Darryle Ned, MD as PCP - Cardiology (Cardiology) Yvone Rush, MD as Consulting Physician (Orthopedic Surgery) Cesario Boer, MD as Attending Physician (Physical Medicine and Rehabilitation)  HISTORY OF PRESENT ILLNESS: 78 y.o. female with a past medical history listed below presents for evaluation of bloating/diarrhea.   Last seen in the office 09/24/2023 by myself for abdominal pain bloating and diarrhea.  Patient was having a week of fatigue bloating diarrhea nausea without vomiting symptoms  were resolving, consider KUB/cross-sectional imaging and stool studies if returns.  Discussed the use of AI scribe software for clinical note transcription with the patient, who gave verbal consent to proceed.  History of Present Illness   Jillian [name] is a 78 year old female who presents with recurrent abdominal bloating and nausea.  She experiences significant abdominal bloating and gas, describing her stomach as 'rumbling' loudly. She frequently feels nauseated and experiences early satiety, feeling full after eating small amounts such as half a sandwich and a cup of soup. There is a sensation of something being present in her abdomen.  She has a history of intermittent stomach issues for years, which she feels are worsening. She continues to use Miralax , though its effectiveness is inconsistent. No vomiting, but there is persistent nausea. She experiences watery stools but also has days without bowel movements, noting an attempt to defecate that was unsuccessful despite feeling the urge.  She has lost approximately seven to eight pounds since March, attributing this to decreased appetite and skipping dinner due to feeling full after lunch. She recalls a past CT scan revealing a mass in her lower right abdomen, which was later determined to be scar tissue. There is concern about the possibility of a misdiagnosis or growth of the mass.  She mentions a history of scoliosis and reduced physical activity. She also reports having had COVID-19, which lingered for three weeks, affecting her overall well-being. There is a history of kidney stones and an allergy  to certain dyes used in imaging procedures, which previously caused a rash.  There is a history of diverticulitis and multiple abdominal surgeries, including a previous bowel obstruction. She expresses concern about her symptoms and the possibility of a  recurrence of previous issues, given her medical history.       She  reports that  she has never smoked. She has never used smokeless tobacco. She reports current alcohol  use. She reports that she does not use drugs.  RELEVANT GI HISTORY, IMAGING AND LABS: Results   RADIOLOGY Abdominal CT: Fibrous tissue in the lower right abdomen (07/2022)      CBC    Component Value Date/Time   WBC 17.0 (H) 04/30/2023 0345   RBC 3.76 (L) 04/30/2023 0345   HGB 10.9 (L) 04/30/2023 0345   HCT 35.1 (L) 04/30/2023 0345   PLT 252 04/30/2023 0345   MCV 93.4 04/30/2023 0345   MCH 29.0 04/30/2023 0345   MCHC 31.1 04/30/2023 0345   RDW 14.1 04/30/2023 0345   Recent Labs    12/13/22 0944 04/21/23 1111 04/29/23 0343 04/30/23 0345  HGB 13.2 13.3 11.4* 10.9*    CMP     Component Value Date/Time   NA 136 04/29/2023 0343   K 4.6 04/29/2023 0343   CL 104 04/29/2023 0343   CO2 22 04/29/2023 0343   GLUCOSE 159 (H) 04/29/2023 0343   BUN 15 04/29/2023 0343   CREATININE 0.95 04/29/2023 0343   CALCIUM  9.3 04/29/2023 0343   PROT 7.0 10/30/2022 0111   PROT 7.5 07/09/2019 1025   ALBUMIN 3.9 10/30/2022 0111   AST 24 10/30/2022 0111   ALT 13 10/30/2022 0111   ALKPHOS 60 10/30/2022 0111   BILITOT 0.9 10/30/2022 0111   GFRNONAA >60 04/29/2023 0343   GFRAA >60 11/26/2018 0737      Latest Ref Rng & Units 10/30/2022    1:11 AM 07/09/2019   10:25 AM 06/26/2017    3:48 PM  Hepatic Function  Total Protein 6.5 - 8.1 g/dL 7.0  7.5  8.4   Albumin 3.5 - 5.0 g/dL 3.9   4.3   AST 15 - 41 U/L 24   24   ALT 0 - 44 U/L 13   16   Alk Phosphatase 38 - 126 U/L 60   88   Total Bilirubin 0.3 - 1.2 mg/dL 0.9   1.1       Current Medications:   Current Outpatient Medications (Endocrine & Metabolic):    EUTHYROX  137 MCG tablet, Take 137 mcg by mouth daily before breakfast.   metFORMIN (GLUCOPHAGE-XR) 500 MG 24 hr tablet, Take 1,000 mg by mouth 2 (two) times daily.   predniSONE  (DELTASONE ) 50 MG tablet, Give 13 hours, 7 hours, and 1 hour prior to contrast dye injection. Notify pharmacy to schedule  dose as appropriate based on procedure date/time.  Current Outpatient Medications (Cardiovascular):    furosemide (LASIX) 40 MG tablet, Take 40 mg by mouth as needed.   rosuvastatin  (CRESTOR ) 20 MG tablet, Take 20 mg by mouth daily.   telmisartan (MICARDIS) 80 MG tablet, Take 80 mg by mouth daily before breakfast.  Current Outpatient Medications (Respiratory):    cetirizine (ZYRTEC) 10 MG tablet, Take 10 mg by mouth daily.   fexofenadine (ALLEGRA) 180 MG tablet, Take 180 mg by mouth daily as needed for allergies or rhinitis.   fluticasone (FLONASE) 50 MCG/ACT nasal spray, 2 spray in each nostril Nasally daily; Duration: 30 days    Current Outpatient Medications (Other):    ALPRAZolam (XANAX) 0.25 MG tablet, Take 0.25 mg by mouth as needed.   ciclopirox (LOPROX) 0.77 % cream, Apply topically 2 (two) times daily.   cyclobenzaprine  (FLEXERIL ) 10 MG tablet, Take 10 mg by mouth 3 (  three) times daily as needed for muscle spasms.   famotidine (PEPCID) 20 MG tablet, Take 20 mg by mouth as needed.   magnesium  hydroxide (MILK OF MAGNESIA) 400 MG/5ML suspension, Take 15 mLs by mouth daily as needed for moderate constipation.   Menthol -Zinc Oxide (CALMOSEPTINE) 0.44-20.6 % OINT, Apply topically as needed.   miconazole  (MICOTIN) 2 % powder, Apply topically as needed for itching.   omeprazole  (PRILOSEC OTC) 20 MG tablet, Take 20 mg by mouth daily.   ondansetron  (ZOFRAN ) 4 MG tablet, Take 1 tablet (4 mg total) by mouth every 6 (six) hours as needed for nausea.   ondansetron  (ZOFRAN -ODT) 4 MG disintegrating tablet, Take 4 mg by mouth 2 (two) times daily.   polyethylene glycol powder (GLYCOLAX /MIRALAX ) 17 GM/SCOOP powder, Take 1 dose once to twice daily by mouth in 8 ounces of fluid (Patient taking differently: Take 34 g by mouth daily as needed for moderate constipation.)   Simethicone (GAS-X PO), Take by mouth.  Medical History:  Past Medical History:  Diagnosis Date   Allergy     Anxiety    Basal  cell carcinoma    Nose treated in the past w/MOHS   Bowel obstruction (HCC)    Cancer (HCC)    skin cancer had Mohs Surgery   Cataract    Coronary artery disease    Diabetes mellitus without complication (HCC)    Diverticulitis    GERD (gastroesophageal reflux disease)    Gout    H/O unstable angina    Headache    High cholesterol    History of kidney stones    Hypertension    Hypothyroidism    IBS (irritable bowel syndrome)    IBS (irritable bowel syndrome)    Neck pain    PONV (postoperative nausea and vomiting)    Renovascular hypertension    Spondylosis of lumbar spine    Allergies:  Allergies  Allergen Reactions   Hydrocodone-Acetaminophen  Itching   Nortriptyline Anxiety and Palpitations   Oxycodone  Itching   Metronidazole Nausea And Vomiting    Flu like symptoms    Sulfamethoxazole Other (See Comments)    Flu-like symptoms.   Amoxicillin     Facial Numbness   Chlorhexidine  Itching    Redness Burning   Luminal [Phenobarbital] Other (See Comments)    Blisters   Mobic [Meloxicam]    Onion     Raw onion causes headaches    Penicillins     Unknown reaction Tolerated Cephalosporin Date: 04/29/23.     Allopurinol Rash   Ciprofloxacin Rash   Doxycycline Rash   Iodinated Contrast Media Rash   Latex Rash   Tape Rash    Adhesive tape   Toradol [Ketorolac Tromethamine] Palpitations    sweating     Surgical History:  She  has a past surgical history that includes Abdominal hysterectomy; Tonsillectomy; Hernia repair; Cholecystectomy; Cardiac catheterization; Wrist fracture surgery (Right); Appendectomy; Radiofrequency ablation nerves (03/2020); Exploratory laparotomy; Breast biopsy; LEFT HEART CATH AND CORONARY ANGIOGRAPHY (N/A, 10/30/2022); CORONARY PRESSURE/FFR STUDY (N/A, 10/30/2022); Bladder surgery; Wrist ganglion excision (Right); Eye surgery (Bilateral); and Total hip arthroplasty (Left, 04/28/2023). Family History:  Her family history includes Breast  cancer in her maternal aunt; Colon cancer in her paternal grandmother; Heart attack in her father.  REVIEW OF SYSTEMS  : All other systems reviewed and negative except where noted in the History of Present Illness.  PHYSICAL EXAM: BP 124/60   Pulse 88   Ht 5' 3 (1.6 m)   Wt 174 lb  2 oz (79 kg)   BMI 30.84 kg/m  Physical Exam   GENERAL APPEARANCE: elderly appearing, in no apparent distress. HEENT: No cervical lymphadenopathy, unremarkable thyroid , sclerae anicteric, conjunctiva pink. RESPIRATORY: Respiratory effort normal, breath sounds equal bilaterally without rales, rhonchi, or wheezing. CARDIO: Regular rate and rhythm with no murmurs, rubs, or gallops, peripheral pulses intact. ABDOMEN: Slightly distended but soft, decreased bowel sounds, no tenderness to palpation, no rebound, no mass appreciated. RECTAL: Declines. MUSCULOSKELETAL: Full range of motion, antalgic gait but able to get on table with help, scoliosiswithout edema. SKIN: Dry, intact without rashes or lesions. No jaundice. NEURO: Alert, oriented, no focal deficits. PSYCH: Cooperative, normal mood and affect.      Alan JONELLE Coombs, PA-C 11:07 AM

## 2023-11-20 ENCOUNTER — Ambulatory Visit: Payer: Self-pay | Admitting: Physician Assistant

## 2023-11-20 ENCOUNTER — Ambulatory Visit (INDEPENDENT_AMBULATORY_CARE_PROVIDER_SITE_OTHER): Admitting: Physician Assistant

## 2023-11-20 ENCOUNTER — Encounter: Payer: Self-pay | Admitting: Physician Assistant

## 2023-11-20 ENCOUNTER — Ambulatory Visit (INDEPENDENT_AMBULATORY_CARE_PROVIDER_SITE_OTHER)
Admission: RE | Admit: 2023-11-20 | Discharge: 2023-11-20 | Disposition: A | Discharge status: Home / Self Care | Source: Ambulatory Visit | Attending: Physician Assistant | Admitting: Physician Assistant

## 2023-11-20 ENCOUNTER — Other Ambulatory Visit (INDEPENDENT_AMBULATORY_CARE_PROVIDER_SITE_OTHER)

## 2023-11-20 ENCOUNTER — Ambulatory Visit: Admitting: Physician Assistant

## 2023-11-20 VITALS — BP 124/60 | HR 88 | Ht 63.0 in | Wt 174.1 lb

## 2023-11-20 DIAGNOSIS — K5900 Constipation, unspecified: Secondary | ICD-10-CM

## 2023-11-20 DIAGNOSIS — R194 Change in bowel habit: Secondary | ICD-10-CM

## 2023-11-20 DIAGNOSIS — R140 Abdominal distension (gaseous): Secondary | ICD-10-CM

## 2023-11-20 DIAGNOSIS — K5790 Diverticulosis of intestine, part unspecified, without perforation or abscess without bleeding: Secondary | ICD-10-CM

## 2023-11-20 DIAGNOSIS — R1084 Generalized abdominal pain: Secondary | ICD-10-CM

## 2023-11-20 DIAGNOSIS — N200 Calculus of kidney: Secondary | ICD-10-CM | POA: Diagnosis not present

## 2023-11-20 DIAGNOSIS — R110 Nausea: Secondary | ICD-10-CM | POA: Diagnosis not present

## 2023-11-20 DIAGNOSIS — E119 Type 2 diabetes mellitus without complications: Secondary | ICD-10-CM

## 2023-11-20 DIAGNOSIS — R1032 Left lower quadrant pain: Secondary | ICD-10-CM

## 2023-11-20 DIAGNOSIS — R109 Unspecified abdominal pain: Secondary | ICD-10-CM | POA: Diagnosis not present

## 2023-11-20 DIAGNOSIS — R32 Unspecified urinary incontinence: Secondary | ICD-10-CM

## 2023-11-20 LAB — CBC WITH DIFFERENTIAL/PLATELET
Basophils Absolute: 0.1 K/uL (ref 0.0–0.1)
Basophils Relative: 0.8 % (ref 0.0–3.0)
Eosinophils Absolute: 0.1 K/uL (ref 0.0–0.7)
Eosinophils Relative: 1.9 % (ref 0.0–5.0)
HCT: 40.4 % (ref 36.0–46.0)
Hemoglobin: 13.2 g/dL (ref 12.0–15.0)
Lymphocytes Relative: 23.1 % (ref 12.0–46.0)
Lymphs Abs: 1.9 K/uL (ref 0.7–4.0)
MCHC: 32.8 g/dL (ref 30.0–36.0)
MCV: 88 fl (ref 78.0–100.0)
Monocytes Absolute: 0.6 K/uL (ref 0.1–1.0)
Monocytes Relative: 7.9 % (ref 3.0–12.0)
Neutro Abs: 5.4 K/uL (ref 1.4–7.7)
Neutrophils Relative %: 66.3 % (ref 43.0–77.0)
Platelets: 291 K/uL (ref 150.0–400.0)
RBC: 4.6 Mil/uL (ref 3.87–5.11)
RDW: 15 % (ref 11.5–15.5)
WBC: 8.1 K/uL (ref 4.0–10.5)

## 2023-11-20 LAB — COMPREHENSIVE METABOLIC PANEL WITH GFR
ALT: 12 U/L (ref 0–35)
AST: 22 U/L (ref 0–37)
Albumin: 4.6 g/dL (ref 3.5–5.2)
Alkaline Phosphatase: 76 U/L (ref 39–117)
BUN: 19 mg/dL (ref 6–23)
CO2: 27 meq/L (ref 19–32)
Calcium: 10.1 mg/dL (ref 8.4–10.5)
Chloride: 103 meq/L (ref 96–112)
Creatinine, Ser: 0.84 mg/dL (ref 0.40–1.20)
GFR: 67.64 mL/min (ref >60.00)
Glucose, Bld: 107 mg/dL — ABNORMAL HIGH (ref 70–99)
Potassium: 4.2 meq/L (ref 3.5–5.1)
Sodium: 140 meq/L (ref 135–145)
Total Bilirubin: 0.6 mg/dL (ref 0.2–1.2)
Total Protein: 8 g/dL (ref 6.0–8.3)

## 2023-11-20 LAB — SEDIMENTATION RATE: Sed Rate: 23 mm/h (ref 0–30)

## 2023-11-20 LAB — LIPASE: Lipase: 14 U/L (ref 11.0–59.0)

## 2023-11-20 MED ORDER — PREDNISONE 50 MG PO TABS: ORAL_TABLET | ORAL | 0 refills | Status: AC

## 2023-11-20 NOTE — Progress Notes (Signed)
 Noted

## 2023-11-20 NOTE — Patient Instructions (Addendum)
 Your provider has requested that you go to the basement level for lab work before leaving today. Press B on the elevator. The lab is located at the first door on the left as you exit the elevator.  Your provider has requested that you have an abdominal x ray before leaving today. Please go to the basement floor to our Radiology department for the test.  Prednisone  50 mg at 13 hours prior to procedure, 7 hours prior to procedure, and 1 hour prior to procedure. Benadryl  50 mg 1 hour prior to procedure.    You have been scheduled for a CT scan of the abdomen and pelvis at [facility] ([address] [place], [place], [place] [address]).   You are scheduled on 11-26-23 at 430pm. You should arrive by 215pm your appointment time for registration. Please follow the written instructions below on the day of your exam:  WARNING: IF YOU ARE ALLERGIC TO IODINE /X-RAY DYE, PLEASE NOTIFY RADIOLOGY IMMEDIATELY AT [phone]! YOU WILL BE GIVEN A 13 HOUR PREMEDICATION PREP.  1) Do not eat after 1230pm  (4 hours prior to your test) 2) You will be given 2 bottles of oral contrast to drink on site.    Drink 1 bottle of contrast at 230pm (2 hours prior to your exam)  Drink 1 bottle of contrast at 330pm (1 hour prior to your exam)  You may take any medications as prescribed with a small amount of water , if necessary. If you take any of the following medications: METFORMIN, GLUCOPHAGE, GLUCOVANCE, AVANDAMET, RIOMET, FORTAMET, ACTOPLUS MET, JANUMET, GLUMETZA or METAGLIP, you MAY be asked to HOLD this medication 48 hours AFTER the exam.  The purpose of you drinking the oral contrast is to aid in the visualization of your intestinal tract. The contrast solution may cause some diarrhea. Depending on your individual set of symptoms, you may also receive an intravenous injection of x-ray contrast/dye. Plan on being at [facility] for 30 minutes or longer, depending on the type of exam you are having  performed.  This test typically takes 30-45 minutes to complete.  If you have any questions regarding your exam or if you need to reschedule, you may call the CT department at [phone] between the hours of 8:00 am and 5:00 pm, Monday-Friday.    First do a trial off milk/lactose products if you use them.  Add fiber like benefiber or citracel once a day Increase activity Can do trial of IBGard which is over the counter for AB pain- Take 1-2 capsules once a day for maintence or twice a day during a flare    FODMAP stands for fermentable oligo-, di-, mono-saccharides and polyols (1). These are the scientific terms used to classify groups of carbs that are difficult for our body to digest and that are notorious for triggering digestive symptoms like bloating, gas, loose stools and stomach pain.   You can try low FODMAP diet  - start with eliminating just one column at a time that you feel may be a trigger for you. - the table at the very bottom contains foods that are low in FODMAPs   Sometimes trying to eliminate the FODMAP's from your diet is difficult or tricky, if you are stuggling with trying to do the elimination diet you can try an enzyme.  There is a food enzymes that you sprinkle in or on your food that helps break down the FODMAP. You can read more about the enzyme by going to this site: https://fodzyme.com/  Small intestinal bacterial overgrowth (SIBO) occurs when there is an abnormal increase in the overall bacterial population in the small intestine -- particularly types of bacteria not commonly found in that part of the digestive tract. Small intestinal bacterial overgrowth (SIBO) commonly results when a circumstance -- such as surgery or disease -- slows the passage of food and waste products in the digestive tract, creating a breeding ground for bacteria.  Signs and symptoms of SIBO often include: Loss of appetite Abdominal pain Nausea Bloating An uncomfortable  feeling of fullness after eating Diarrhea or constipation, depending on the type of gas produced  What foods trigger SIBO? While foods aren't the original cause of SIBO, certain foods do encourage the overgrowth of the wrong bacteria in your small intestine. If you're feeding them their favorite foods, they're going to grow more, and that will trigger more of your SIBO symptoms. By the same token, you can help reduce the overgrowth by starving the problematic bacteria of their favorite foods. This strategy has led to a number of proposed SIBO eating plans. The plans vary, and so do individual results. But in general, they tend to recommend limiting carbohydrates.  These include: Sugars and sweeteners. Fruits and starchy vegetables. Dairy products. Grains.  There is a test for this we can do called a breath test, if you are positive we will treat you with an antibiotic to see if it helps.  Your symptoms are very suspicious for this condition, as discussed, we will start you on an antibiotic to see if this helps.

## 2023-11-24 ENCOUNTER — Telehealth: Payer: Self-pay | Admitting: Physician Assistant

## 2023-11-24 NOTE — Telephone Encounter (Signed)
 Inbound call from patient stating that she is scheduled to have a CT on 10/1 and is requesting a call to discuss a few  questions she has regarding the test. Please advise.

## 2023-11-24 NOTE — Telephone Encounter (Signed)
 Inbound call from patient wanting to know if she needs to take a laxative before scheduled CT scan on 10/1 and if she needs to have her husband drive her.  Patient is requesting a call back  Please advise  Thank you

## 2023-11-25 NOTE — Telephone Encounter (Signed)
Patient questioned answered.

## 2023-11-26 ENCOUNTER — Ambulatory Visit (HOSPITAL_COMMUNITY)
Admission: RE | Admit: 2023-11-26 | Discharge: 2023-11-26 | Disposition: A | Discharge status: Home / Self Care | Source: Ambulatory Visit | Attending: Physician Assistant | Admitting: Physician Assistant

## 2023-11-26 ENCOUNTER — Other Ambulatory Visit

## 2023-11-26 DIAGNOSIS — R1032 Left lower quadrant pain: Secondary | ICD-10-CM | POA: Insufficient documentation

## 2023-11-26 DIAGNOSIS — K5730 Diverticulosis of large intestine without perforation or abscess without bleeding: Secondary | ICD-10-CM | POA: Diagnosis not present

## 2023-11-26 DIAGNOSIS — K439 Ventral hernia without obstruction or gangrene: Secondary | ICD-10-CM | POA: Diagnosis not present

## 2023-11-26 DIAGNOSIS — N132 Hydronephrosis with renal and ureteral calculous obstruction: Secondary | ICD-10-CM | POA: Diagnosis not present

## 2023-11-26 MED ORDER — IOHEXOL 300 MG/ML  SOLN
100.0000 mL | Freq: Once | INTRAMUSCULAR | Status: AC | PRN
  Administered 2023-11-26: 100 mL via INTRAVENOUS

## 2023-11-26 MED ORDER — SODIUM CHLORIDE (PF) 0.9 % IJ SOLN
INTRAMUSCULAR | Status: AC
  Filled 2023-11-26: qty 50

## 2023-11-26 MED ORDER — BARIUM SULFATE 2 % PO SUSP: 900.0000 mL | Freq: Once | ORAL | Status: DC

## 2023-11-26 MED ORDER — BARIUM SULFATE 2 % PO SUSP
900.0000 mL | Freq: Once | ORAL | Status: AC
  Administered 2023-11-26: 1 mL via ORAL

## 2023-12-02 NOTE — Telephone Encounter (Signed)
 Inbound call from patient requesting a call to discuss results. Please advise, thank you

## 2023-12-03 ENCOUNTER — Telehealth: Payer: Self-pay | Admitting: Cardiovascular Disease

## 2023-12-03 NOTE — Telephone Encounter (Signed)
 Pt returned call, please advise.

## 2023-12-03 NOTE — Telephone Encounter (Signed)
 Pt had a CT scan recently and it showed aortic atherosclerosis. The office performing CT advised she see her cardiologist very soon. Scheduled 10/29 but would like a call from the nurse to discuss diagnosis. Please advise.

## 2023-12-03 NOTE — Telephone Encounter (Signed)
 Left a message to call back.

## 2023-12-03 NOTE — Telephone Encounter (Signed)
 Patient is worried about the build up of plaque on her Aorta. She said that she had a cath a year ago and noone mentioned any plaque in her Aorta. She has a f/u appt on 12/24/23.   Informed her that I will send this information to her provider and call back with feedback. Given reassurance.

## 2023-12-04 ENCOUNTER — Ambulatory Visit

## 2023-12-09 ENCOUNTER — Telehealth: Payer: Self-pay | Admitting: Physician Assistant

## 2023-12-09 NOTE — Progress Notes (Deleted)
 Cardiology Clinic Note   Patient Name: Ryiah Renella Blush Date of Encounter: 12/09/2023  Primary Care Provider:  Shepard Ade, MD Primary Cardiologist:  Darryle ONEIDA Decent, MD  Patient Profile    Amaranta Tylan Briguglio 78 year old female presents the clinic today to review her aortic atherosclerosis and for follow-up of her CAD and hyperlipidemia.  Past Medical History    Past Medical History:  Diagnosis Date   Allergy     Anxiety    Basal cell carcinoma    Nose treated in the past w/MOHS   Bowel obstruction (HCC)    Cancer (HCC)    skin cancer had Mohs Surgery   Cataract    Coronary artery disease    Diabetes mellitus without complication (HCC)    Diverticulitis    GERD (gastroesophageal reflux disease)    Gout    H/O unstable angina    Headache    High cholesterol    History of kidney stones    Hypertension    Hypothyroidism    IBS (irritable bowel syndrome)    IBS (irritable bowel syndrome)    Neck pain    PONV (postoperative nausea and vomiting)    Renovascular hypertension    Spondylosis of lumbar spine    Past Surgical History:  Procedure Laterality Date   ABDOMINAL HYSTERECTOMY     APPENDECTOMY     BLADDER SURGERY     cryo-ablation   BREAST BIOPSY     unsure which breast   CARDIAC CATHETERIZATION     x 2   CHOLECYSTECTOMY     open   CORONARY PRESSURE/FFR STUDY N/A 10/30/2022   Procedure: CORONARY PRESSURE/FFR STUDY;  Surgeon: Darron Deatrice LABOR, MD;  Location: [facility];  Service: Cardiovascular;  Laterality: N/A;   EXPLORATORY LAPAROTOMY     EYE SURGERY Bilateral    cataract removal   HERNIA REPAIR     x 2,   umbilcal and abdominal incisional with 12 mesh   LEFT HEART CATH AND CORONARY ANGIOGRAPHY N/A 10/30/2022   Procedure: LEFT HEART CATH AND CORONARY ANGIOGRAPHY;  Surgeon: Darron Deatrice LABOR, MD;  Location: [facility];  Service: Cardiovascular;  Laterality: N/A;   RADIOFREQUENCY ABLATION NERVES  03/2020   on back,  trial for possible spinal cord stimulator   TONSILLECTOMY     TOTAL HIP ARTHROPLASTY Left 04/28/2023   Procedure: LEFT TOTAL HIP ARTHROPLASTY ANTERIOR APPROACH;  Surgeon: Melodi Lerner, MD;  Location: [facility];  Service: Orthopedics;  Laterality: Left;   WRIST FRACTURE SURGERY Right    WRIST GANGLION EXCISION Right     Allergies  Allergies  Allergen Reactions   Hydrocodone-Acetaminophen  Itching   Nortriptyline Anxiety and Palpitations   Oxycodone  Itching   Metronidazole Nausea And Vomiting    Flu like symptoms    Sulfamethoxazole Other (See Comments)    Flu-like symptoms.   Amoxicillin     Facial Numbness   Chlorhexidine  Itching    Redness Burning   Luminal [Phenobarbital] Other (See Comments)    Blisters   Mobic [Meloxicam]    Onion     Raw onion causes headaches    Penicillins     Unknown reaction Tolerated Cephalosporin Date: 04/29/23.     Allopurinol Rash   Ciprofloxacin Rash   Doxycycline Rash   Iodinated Contrast Media Rash   Latex Rash   Tape Rash    Adhesive tape   Toradol [Ketorolac Tromethamine] Palpitations    sweating    History of Present Illness  Lonetta Brendia Dampier has a PMH of coronary artery disease, diabetes, hypertension, and hyperlipidemia.  She was seen in follow-up by Dr. Barbaraann on 02/11/2023.  During that time her blood pressure was slightly elevated due to stress with traffic prior to her visit.  She reported that she had delayed planned surgery due to dermatological issues.  She was working with dermatology to resolve this.  She reported that her blood pressure would typically run in the 130s over 80s.  She was taking Micardis.  Her cholesterol was well-controlled.  She denied chest pain and changes with her breathing.  She did note some recent body aches.  She felt that this was related to/could attribute this to arthritis.  She denied cardiac concerns.  She contacted the cardiology clinic with concern about aortic atherosclerosis  10/25.  She presents to the clinic today for follow-up evaluation and states***.  *** denies chest pain, shortness of breath, lower extremity edema, fatigue, palpitations, melena, hematuria, hemoptysis, diaphoresis, weakness, presyncope, syncope, orthopnea, and PND.  Nonobstructive CAD-no chest pain today.  Denies exertional chest discomfort.  Previously noted to have mid LAD 60% stenosis and RFR of 0.94 on 10/30/2022. Heart healthy low-sodium diet Maintain physical activity Continue aspirin  and rosuvastatin   Aortic atherosclerosis, hyperlipidemia-patient reassured that her aortic atherosclerosis is an incidental finding.  Reviewed treatment for atherosclerosis.  She expressed understanding. Continue aspirin  and rosuvastatin   Essential hypertension-BP today***. Maintain blood pressure log Heart healthy low-sodium diet-salty 6 diet sheet given Continue telmisartan  Diabetes-A1c***. Carb modified diet Continue metformin Follows with PCP  Disposition: Follow-up with Dr. Barbaraann or me in 12 months.  Home Medications    Prior to Admission medications   Medication Sig Start Date End Date Taking? Authorizing Provider  ALPRAZolam (XANAX) 0.25 MG tablet Take 0.25 mg by mouth as needed. 07/28/23   [provider]  cetirizine (ZYRTEC) 10 MG tablet Take 10 mg by mouth daily.    [provider]  ciclopirox (LOPROX) 0.77 % cream Apply topically 2 (two) times daily. 11/05/23   [provider]  cyclobenzaprine  (FLEXERIL ) 10 MG tablet Take 10 mg by mouth 3 (three) times daily as needed for muscle spasms.    [provider]  EUTHYROX  137 MCG tablet Take 137 mcg by mouth daily before breakfast.    [provider]  famotidine (PEPCID) 20 MG tablet Take 20 mg by mouth as needed. 05/28/22   [provider]  fexofenadine (ALLEGRA) 180 MG tablet Take 180 mg by mouth daily as needed for allergies or rhinitis.    [provider]  fluticasone (FLONASE)  50 MCG/ACT nasal spray 2 spray in each nostril Nasally daily; Duration: 30 days    [provider]  furosemide (LASIX) 40 MG tablet Take 40 mg by mouth as needed. 10/02/16   [provider]  magnesium  hydroxide (MILK OF MAGNESIA) 400 MG/5ML suspension Take 15 mLs by mouth daily as needed for moderate constipation.    [provider]  Menthol -Zinc Oxide (CALMOSEPTINE) 0.44-20.6 % OINT Apply topically as needed.    [provider]  metFORMIN (GLUCOPHAGE-XR) 500 MG 24 hr tablet Take 1,000 mg by mouth 2 (two) times daily.    [provider]  miconazole  (MICOTIN) 2 % powder Apply topically as needed for itching.    [provider]  omeprazole  (PRILOSEC OTC) 20 MG tablet Take 20 mg by mouth daily.    [provider]  ondansetron  (ZOFRAN ) 4 MG tablet Take 1 tablet (4 mg total) by mouth  every 6 (six) hours as needed for nausea. 04/29/23   Edmisten, Kristie L, PA  ondansetron  (ZOFRAN -ODT) 4 MG disintegrating tablet Take 4 mg by mouth 2 (two) times daily. 09/22/23   [provider]  polyethylene glycol powder (GLYCOLAX /MIRALAX ) 17 GM/SCOOP powder Take 1 dose once to twice daily by mouth in 8 ounces of fluid Patient taking differently: Take 34 g by mouth daily as needed for moderate constipation. 12/23/19   Esterwood, Amy S, PA-C  predniSONE  (DELTASONE ) 50 MG tablet Give 13 hours, 7 hours, and 1 hour prior to contrast dye injection. Notify pharmacy to schedule dose as appropriate based on procedure date/time. 11/20/23   Craig Alan SAUNDERS, PA-C  rosuvastatin  (CRESTOR ) 20 MG tablet Take 20 mg by mouth daily.    [provider]  Simethicone (GAS-X PO) Take by mouth.    [provider]  telmisartan (MICARDIS) 80 MG tablet Take 80 mg by mouth daily before breakfast. 10/02/16   [provider]    Family History    Family History  Problem Relation Age of Onset   Heart attack Father    Breast cancer Maternal Aunt    Colon  cancer Paternal Grandmother    She indicated that her mother is deceased. She indicated that her father is deceased. She indicated that the status of her paternal grandmother is unknown. She indicated that the status of her maternal aunt is unknown.  Social History    Social History   Socioeconomic History   Marital status: Married    Spouse name: Not on file   Number of children: 1   Years of education: Not on file   Highest education level: Not on file  Occupational History   Occupation: Retired  Tobacco Use   Smoking status: Never   Smokeless tobacco: Never  Vaping Use   Vaping status: Never Used  Substance and Sexual Activity   Alcohol  use: Yes    Comment: occasional   Drug use: Never   Sexual activity: Yes    Birth control/protection: Surgical  Other Topics Concern   Not on file  Social History Narrative   Not on file   Social Drivers of Health   Financial Resource Strain: Not on file  Food Insecurity: No Food Insecurity (04/28/2023)   Hunger Vital Sign    Worried About Running Out of Food in the Last Year: Never true    Ran Out of Food in the Last Year: Never true  Transportation Needs: No Transportation Needs (04/28/2023)   PRAPARE - Administrator, Civil Service (Medical): No    Lack of Transportation (Non-Medical): No  Physical Activity: Not on file  Stress: Stress Concern Present (09/26/2020)   Received from [facility] - Occupational Stress Questionnaire    Feeling of Stress : To some extent  Social Connections: Socially Integrated (04/28/2023)   Social Connection and Isolation Panel    Frequency of Communication with Friends and Family: More than three times a week    Frequency of Social Gatherings with Friends and Family: More than three times a week    Attends Religious Services: More than 4 times per year    Active Member of [employer] or Organizations: Yes    Attends Banker Meetings: More  than 4 times per year    Marital Status: Married  Catering Manager Violence: Not At Risk (04/28/2023)   Humiliation, Afraid, Rape, and Kick questionnaire    Fear of Current  or Ex-Partner: No    Emotionally Abused: No    Physically Abused: No    Sexually Abused: No     Review of Systems    General:  No chills, fever, night sweats or weight changes.  Cardiovascular:  No chest pain, dyspnea on exertion, edema, orthopnea, palpitations, paroxysmal nocturnal dyspnea. Dermatological: No rash, lesions/masses Respiratory: No cough, dyspnea Urologic: No hematuria, dysuria Abdominal:   No nausea, vomiting, diarrhea, bright red blood per rectum, melena, or hematemesis Neurologic:  No visual changes, wkns, changes in mental status. All other systems reviewed and are otherwise negative except as noted above.  Physical Exam    VS:  There were no vitals taken for this visit. , BMI There is no height or weight on file to calculate BMI. GEN: Well nourished, well developed, in no acute distress. HEENT: normal. Neck: Supple, no JVD, carotid bruits, or masses. Cardiac: RRR, no murmurs, rubs, or gallops. No clubbing, cyanosis, edema.  Radials/DP/PT 2+ and equal bilaterally.  Respiratory:  Respirations regular and unlabored, clear to auscultation bilaterally. GI: Soft, nontender, nondistended, BS + x 4. MS: no deformity or atrophy. Skin: warm and dry, no rash. Neuro:  Strength and sensation are intact. Psych: Normal affect.  Accessory Clinical Findings    Recent Labs: 11/20/2023: ALT 12; BUN 19; Creatinine, Ser 0.84; Hemoglobin 13.2; Platelets 291.0; Potassium 4.2; Sodium 140   Recent Lipid Panel    Component Value Date/Time   CHOL 122 10/30/2022 0111   TRIG 90 10/30/2022 0111   HDL 55 10/30/2022 0111   CHOLHDL 2.2 10/30/2022 0111   VLDL 18 10/30/2022 0111   LDLCALC 49 10/30/2022 0111    No BP recorded.  {Refresh Note OR Click here to enter BP  :1}***    ECG personally reviewed by me  today- ***     Cardiac PET CT 10/24/2022   Small partially reversible perfusion defect at apex and apical inferior wall, with hypokinesis at apex, suggesting distal LAD territory infarct with peri-infarct ischemia.  Normal myocardial blood flow reserve globally and in LAD territory. Moderate LAD calcifications on CT. No high risk findings such as TID or drop in EF with stress.  Overall, study is low risk, as area of ischemia is small   EKG not recorded during study   LV perfusion is abnormal. There is evidence of ischemia. There is evidence of infarction. Defect 1: There is a small defect with moderate reduction in uptake present in the apical inferior and apex location(s) that is partially reversible. There is abnormal wall motion in the defect area. Consistent with peri-infarct ischemia.   Rest left ventricular function is normal. Rest EF: 63%. Stress left ventricular function is normal. Stress EF: 72%. End diastolic cavity size is normal. End systolic cavity size is normal.   Myocardial blood flow was computed to be 2.60ml/g/min at rest and 4.80ml/g/min at stress. Global myocardial blood flow reserve was 2.38 and was normal.   Coronary calcium  was present on the attenuation correction CT images. Moderate coronary calcifications were present. Coronary calcifications were present in the left anterior descending artery distribution(s).   Findings are consistent with infarction with peri-infarct ischemia. The study is low risk.   Electronically signed by Lonni Nanas, MD   CLINICAL DATA:  This over-read does not include interpretation of cardiac or coronary anatomy or pathology. The Cardiac PET CT interpretation by the cardiologist is attached.   COMPARISON:  None Available.   FINDINGS: Cardiovascular: Aortic atherosclerosis. Normal heart size. Left coronary artery calcifications.  No pericardial effusion.   Limited Mediastinum/Nodes: No enlarged mediastinal, hilar, or axillary lymph  nodes. Trachea and esophagus demonstrate no significant findings.   Limited Lungs/Pleura: Minimal bibasilar scarring or atelectasis. No pleural effusion or pneumothorax.   Upper Abdomen: No acute abnormality.   Musculoskeletal: No chest wall abnormality. No acute osseous findings.   IMPRESSION: 1. No acute CT findings of the chest. 2. Coronary artery disease.   Aortic Atherosclerosis (ICD10-I70.0).     Electronically Signed   By: Marolyn JONETTA Jaksch M.D.   On: 10/24/2022 11:59  [facility] 10/30/2022    Prox LAD to Mid LAD lesion is 60% stenosed.   1st Diag lesion is 60% stenosed.   Prox RCA lesion is 30% stenosed.   The left ventricular systolic function is normal.   LV end diastolic pressure is mildly elevated.   The left ventricular ejection fraction is 55-65% by visual estimate.   1.  Moderate bifurcation stenosis involving proximal/mid LAD at the origin of first diagonal which also has moderate stenosis.  The LAD stenosis was interrogated by flow reserve evaluation and was not significant with an RFR of 0.94. 2.  Normal LV systolic function. 3.  Mildly elevated left ventricular end-diastolic pressure at 20 mmHg in the setting of sinus tachycardia and significantly elevated blood pressure.   Recommendations: Recommend aggressive medical therapy.  No indication for PCI.  Diagnostic Dominance: Right  Intervention  Assessment & Plan   1.  ***   Josefa HERO. Jacklyn Branan NP-C     12/09/2023, 4:29 PM [facility] [address] [place], [place] [address] Office [phone]    Notice: This dictation was prepared with Dragon dictation along with smaller phrase technology. Any transcriptional errors that result from this process are unintentional and may not be corrected upon review.   I spent***minutes examining this patient, reviewing medications, and using patient centered shared decision making involving their cardiac care.   I spent  20  minutes reviewing past medical history,  medications, and prior cardiac tests.

## 2023-12-09 NOTE — Telephone Encounter (Signed)
 Patient came into the office today to ask about SIBO KIT.  She seen her results on mychart no one has contacted to date. Patient was in the area and thought she would pick the kit up.     [name]  Provided the patient with the kit and the patient is waiting for further instructions.. Patient states she needs a CPT code. [name] Seen this patient last, however she is a Technical sales engineer patient.   Amanda Cc. Please advise on patients' CT results.

## 2023-12-10 NOTE — Telephone Encounter (Signed)
 Left message for pt to call back

## 2023-12-11 ENCOUNTER — Ambulatory Visit: Admitting: General Practice

## 2023-12-11 NOTE — Telephone Encounter (Signed)
 Patient identification verified by 2 forms.   Relied Dr. Rosslyn message.

## 2023-12-11 NOTE — Telephone Encounter (Signed)
 Refer back to mychart message 11/20/23.

## 2023-12-11 NOTE — Telephone Encounter (Signed)
 Patient returning phone call. Please advise, thank you

## 2023-12-12 NOTE — Telephone Encounter (Signed)
 Spoke with patient & she requested codes for SIBO for insurance purposes. Provided codes: abdominal pain (R10.84), bloating (R14.0), and nausea (R11.0)

## 2023-12-12 NOTE — Progress Notes (Unsigned)
 Cardiology Clinic Note   Patient Name: Jillian [name] Date of Encounter: 12/15/2023  Primary Care Provider:  Shepard Ade, MD Primary Cardiologist:  Darryle ONEIDA Decent, MD  Patient Profile    Jillian [name] 78 year old female presents the clinic today to review her aortic atherosclerosis and for follow-up of her CAD and hyperlipidemia.  Past Medical History    Past Medical History:  Diagnosis Date   Allergy     Anxiety    Basal cell carcinoma    Nose treated in the past w/MOHS   Bowel obstruction (HCC)    Cancer (HCC)    skin cancer had Mohs Surgery   Cataract    Coronary artery disease    Diabetes mellitus without complication (HCC)    Diverticulitis    GERD (gastroesophageal reflux disease)    Gout    H/O unstable angina    Headache    High cholesterol    History of kidney stones    Hypertension    Hypothyroidism    IBS (irritable bowel syndrome)    IBS (irritable bowel syndrome)    Neck pain    PONV (postoperative nausea and vomiting)    Renovascular hypertension    Spondylosis of lumbar spine    Past Surgical History:  Procedure Laterality Date   ABDOMINAL HYSTERECTOMY     APPENDECTOMY     BLADDER SURGERY     cryo-ablation   BREAST BIOPSY     unsure which breast   CARDIAC CATHETERIZATION     x 2   CHOLECYSTECTOMY     open   CORONARY PRESSURE/FFR STUDY N/A 10/30/2022   Procedure: CORONARY PRESSURE/FFR STUDY;  Surgeon: Darron Deatrice LABOR, MD;  Location: [facility];  Service: Cardiovascular;  Laterality: N/A;   EXPLORATORY LAPAROTOMY     EYE SURGERY Bilateral    cataract removal   HERNIA REPAIR     x 2,   umbilcal and abdominal incisional with 12 mesh   LEFT HEART CATH AND CORONARY ANGIOGRAPHY N/A 10/30/2022   Procedure: LEFT HEART CATH AND CORONARY ANGIOGRAPHY;  Surgeon: Darron Deatrice LABOR, MD;  Location: [facility];  Service: Cardiovascular;  Laterality: N/A;   RADIOFREQUENCY ABLATION NERVES  03/2020   on back,  trial for possible spinal cord stimulator   TONSILLECTOMY     TOTAL HIP ARTHROPLASTY Left 04/28/2023   Procedure: LEFT TOTAL HIP ARTHROPLASTY ANTERIOR APPROACH;  Surgeon: Melodi Lerner, MD;  Location: [facility];  Service: Orthopedics;  Laterality: Left;   WRIST FRACTURE SURGERY Right    WRIST GANGLION EXCISION Right     Allergies  Allergies  Allergen Reactions   Hydrocodone-Acetaminophen  Itching   Nortriptyline Anxiety and Palpitations   Oxycodone  Itching   Metronidazole Nausea And Vomiting    Flu like symptoms    Sulfamethoxazole Other (See Comments)    Flu-like symptoms.   Amoxicillin     Facial Numbness   Chlorhexidine  Itching    Redness Burning   Luminal [Phenobarbital] Other (See Comments)    Blisters   Mobic [Meloxicam]    Onion     Raw onion causes headaches    Penicillins     Unknown reaction Tolerated Cephalosporin Date: 04/29/23.     Allopurinol Rash   Ciprofloxacin Rash   Doxycycline Rash   Iodinated Contrast Media Rash   Latex Rash   Tape Rash    Adhesive tape   Toradol [Ketorolac Tromethamine] Palpitations    sweating    History of Present Illness  Jillian [name] has a PMH of coronary artery disease, diabetes, hypertension, and hyperlipidemia.  She was seen in follow-up by Dr. Barbaraann on 02/11/2023.  During that time her blood pressure was slightly elevated due to stress with traffic prior to her visit.  She reported that she had delayed planned surgery due to dermatological issues.  She was working with dermatology to resolve this.  She reported that her blood pressure would typically run in the 130s over 80s.  She was taking Micardis.  Her cholesterol was well-controlled.  She denied chest pain and changes with her breathing.  She did note some recent body aches.  She felt that this was related to/could attribute this to arthritis.  She denied cardiac concerns.  She contacted the cardiology clinic with concern about aortic atherosclerosis  10/25.  She presents to the clinic today for follow-up evaluation and states she is limited in her physical activity due to hip pain.  She had a left hip replacement that got infected.  She now has watery stools.  This was the reason for her abdomen and pelvis CT.  We reviewed her aortic stenosis.  She expressed understanding.  She reports compliance with her medications.  Her blood pressure today initially is somewhat elevated at 142/80 and on recheck is 134/86.  I will continue her current medication regimen, request labs from PCP, and plan follow-up in 9 to 12 months..  Today she denies chest pain, shortness of breath, lower extremity edema, fatigue, palpitations, melena, hematuria, hemoptysis, diaphoresis, weakness, presyncope, syncope, orthopnea, and PND.    Home Medications    Prior to Admission medications   Medication Sig Start Date End Date Taking? Authorizing Provider  ALPRAZolam (XANAX) 0.25 MG tablet Take 0.25 mg by mouth as needed. 07/28/23   [provider]  cetirizine (ZYRTEC) 10 MG tablet Take 10 mg by mouth daily.    [provider]  ciclopirox (LOPROX) 0.77 % cream Apply topically 2 (two) times daily. 11/05/23   [provider]  cyclobenzaprine  (FLEXERIL ) 10 MG tablet Take 10 mg by mouth 3 (three) times daily as needed for muscle spasms.    [provider]  EUTHYROX  137 MCG tablet Take 137 mcg by mouth daily before breakfast.    [provider]  famotidine (PEPCID) 20 MG tablet Take 20 mg by mouth as needed. 05/28/22   [provider]  fexofenadine (ALLEGRA) 180 MG tablet Take 180 mg by mouth daily as needed for allergies or rhinitis.    [provider]  fluticasone (FLONASE) 50 MCG/ACT nasal spray 2 spray in each nostril Nasally daily; Duration: 30 days    [provider]  furosemide (LASIX) 40 MG tablet Take 40 mg by mouth as needed. 10/02/16   [provider]  magnesium  hydroxide (MILK OF MAGNESIA) 400  MG/5ML suspension Take 15 mLs by mouth daily as needed for moderate constipation.    [provider]  Menthol -Zinc Oxide (CALMOSEPTINE) 0.44-20.6 % OINT Apply topically as needed.    [provider]  metFORMIN (GLUCOPHAGE-XR) 500 MG 24 hr tablet Take 1,000 mg by mouth 2 (two) times daily.    [provider]  miconazole  (MICOTIN) 2 % powder Apply topically as needed for itching.    [provider]  omeprazole  (PRILOSEC OTC) 20 MG tablet Take 20 mg by mouth daily.    [provider]  ondansetron  (ZOFRAN ) 4 MG tablet Take 1 tablet (4 mg total) by mouth every 6 (six) hours as needed for nausea.  04/29/23   Edmisten, Kristie L, PA  ondansetron  (ZOFRAN -ODT) 4 MG disintegrating tablet Take 4 mg by mouth 2 (two) times daily. 09/22/23   [provider]  polyethylene glycol powder (GLYCOLAX /MIRALAX ) 17 GM/SCOOP powder Take 1 dose once to twice daily by mouth in 8 ounces of fluid Patient taking differently: Take 34 g by mouth daily as needed for moderate constipation. 12/23/19   Esterwood, Amy S, PA-C  predniSONE  (DELTASONE ) 50 MG tablet Give 13 hours, 7 hours, and 1 hour prior to contrast dye injection. Notify pharmacy to schedule dose as appropriate based on procedure date/time. 11/20/23   Craig Alan SAUNDERS, PA-C  rosuvastatin  (CRESTOR ) 20 MG tablet Take 20 mg by mouth daily.    [provider]  Simethicone (GAS-X PO) Take by mouth.    [provider]  telmisartan (MICARDIS) 80 MG tablet Take 80 mg by mouth daily before breakfast. 10/02/16   [provider]    Family History    Family History  Problem Relation Age of Onset   Heart attack Father    Breast cancer Maternal Aunt    Colon cancer Paternal Grandmother    She indicated that her mother is deceased. She indicated that her father is deceased. She indicated that the status of her paternal grandmother is unknown. She indicated that the status of her maternal aunt is  unknown.  Social History    Social History   Socioeconomic History   Marital status: Married    Spouse name: Not on file   Number of children: 1   Years of education: Not on file   Highest education level: Not on file  Occupational History   Occupation: Retired  Tobacco Use   Smoking status: Never   Smokeless tobacco: Never  Vaping Use   Vaping status: Never Used  Substance and Sexual Activity   Alcohol  use: Yes    Comment: occasional   Drug use: Never   Sexual activity: Yes    Birth control/protection: Surgical  Other Topics Concern   Not on file  Social History Narrative   Not on file   Social Drivers of Health   Financial Resource Strain: Not on file  Food Insecurity: No Food Insecurity (04/28/2023)   Hunger Vital Sign    Worried About Running Out of Food in the Last Year: Never true    Ran Out of Food in the Last Year: Never true  Transportation Needs: No Transportation Needs (04/28/2023)   PRAPARE - Administrator, Civil Service (Medical): No    Lack of Transportation (Non-Medical): No  Physical Activity: Not on file  Stress: Stress Concern Present (09/26/2020)   Received from [facility] of Occupational Health - Occupational Stress Questionnaire    Feeling of Stress : To some extent  Social Connections: Socially Integrated (04/28/2023)   Social Connection and Isolation Panel    Frequency of Communication with Friends and Family: More than three times a week    Frequency of Social Gatherings with Friends and Family: More than three times a week    Attends Religious Services: More than 4 times per year    Active Member of [employer] or Organizations: Yes    Attends Engineer, structural: More than 4 times per year    Marital Status: Married  Catering manager Violence: Not At Risk (04/28/2023)   Humiliation, Afraid, Rape, and Kick questionnaire    Fear of Current or Ex-Partner: No    Emotionally Abused: No  Physically Abused: No     Sexually Abused: No     Review of Systems    General:  No chills, fever, night sweats or weight changes.  Cardiovascular:  No chest pain, dyspnea on exertion, edema, orthopnea, palpitations, paroxysmal nocturnal dyspnea. Dermatological: No rash, lesions/masses Respiratory: No cough, dyspnea Urologic: No hematuria, dysuria Abdominal:   No nausea, vomiting, diarrhea, bright red blood per rectum, melena, or hematemesis Neurologic:  No visual changes, wkns, changes in mental status. All other systems reviewed and are otherwise negative except as noted above.  Physical Exam    VS:  BP 134/86   Pulse (!) 102   Ht 5' 4 (1.626 m)   Wt 171 lb 3.2 oz (77.7 kg)   SpO2 96%   BMI 29.39 kg/m  , BMI Body mass index is 29.39 kg/m. GEN: Well nourished, well developed, in no acute distress. HEENT: normal. Neck: Supple, no JVD, carotid bruits, or masses. Cardiac: RRR, no murmurs, rubs, or gallops. No clubbing, cyanosis, edema.  Radials/DP/PT 2+ and equal bilaterally.  Respiratory:  Respirations regular and unlabored, clear to auscultation bilaterally. GI: Soft, nontender, nondistended, BS + x 4. MS: no deformity or atrophy. Skin: warm and dry, no rash. Neuro:  Strength and sensation are intact. Psych: Normal affect.  Accessory Clinical Findings    Recent Labs: 11/20/2023: ALT 12; BUN 19; Creatinine, Ser 0.84; Hemoglobin 13.2; Platelets 291.0; Potassium 4.2; Sodium 140   Recent Lipid Panel    Component Value Date/Time   CHOL 122 10/30/2022 0111   TRIG 90 10/30/2022 0111   HDL 55 10/30/2022 0111   CHOLHDL 2.2 10/30/2022 0111   VLDL 18 10/30/2022 0111   LDLCALC 49 10/30/2022 0111         ECG personally reviewed by me today- EKG Interpretation Date/Time:  Monday December 15 2023 08:29:01 EDT Ventricular Rate:  102 PR Interval:  134 QRS Duration:  84 QT Interval:  334 QTC Calculation: 435 R Axis:   -26  Text Interpretation: Sinus tachycardia When compared with ECG of  06-Nov-2022 08:46, No significant change was found Confirmed by Emelia Hazy [phone]) on 12/15/2023 8:33:04 AM    Cardiac PET CT 10/24/2022   Small partially reversible perfusion defect at apex and apical inferior wall, with hypokinesis at apex, suggesting distal LAD territory infarct with peri-infarct ischemia.  Normal myocardial blood flow reserve globally and in LAD territory. Moderate LAD calcifications on CT. No high risk findings such as TID or drop in EF with stress.  Overall, study is low risk, as area of ischemia is small   EKG not recorded during study   LV perfusion is abnormal. There is evidence of ischemia. There is evidence of infarction. Defect 1: There is a small defect with moderate reduction in uptake present in the apical inferior and apex location(s) that is partially reversible. There is abnormal wall motion in the defect area. Consistent with peri-infarct ischemia.   Rest left ventricular function is normal. Rest EF: 63%. Stress left ventricular function is normal. Stress EF: 72%. End diastolic cavity size is normal. End systolic cavity size is normal.   Myocardial blood flow was computed to be 2.45ml/g/min at rest and 4.80ml/g/min at stress. Global myocardial blood flow reserve was 2.38 and was normal.   Coronary calcium  was present on the attenuation correction CT images. Moderate coronary calcifications were present. Coronary calcifications were present in the left anterior descending artery distribution(s).   Findings are consistent with infarction with peri-infarct ischemia. The study is low risk.  Electronically signed by Lonni Nanas, MD   CLINICAL DATA:  This over-read does not include interpretation of cardiac or coronary anatomy or pathology. The Cardiac PET CT interpretation by the cardiologist is attached.   COMPARISON:  None Available.   FINDINGS: Cardiovascular: Aortic atherosclerosis. Normal heart size. Left coronary artery calcifications. No  pericardial effusion.   Limited Mediastinum/Nodes: No enlarged mediastinal, hilar, or axillary lymph nodes. Trachea and esophagus demonstrate no significant findings.   Limited Lungs/Pleura: Minimal bibasilar scarring or atelectasis. No pleural effusion or pneumothorax.   Upper Abdomen: No acute abnormality.   Musculoskeletal: No chest wall abnormality. No acute osseous findings.   IMPRESSION: 1. No acute CT findings of the chest. 2. Coronary artery disease.   Aortic Atherosclerosis (ICD10-I70.0).     Electronically Signed   By: Marolyn JONETTA Jaksch M.D.   On: 10/24/2022 11:59  CT abdomen pelvis 11/26/2023  FINDINGS: Lower chest: Hypoventilatory change in the dependent lungs.   Hepatobiliary: No suspicious hepatic lesion. Gallbladder surgically absent. Mild prominence of the biliary tree is within normal limits for reservoir effect post cholecystectomy.   Pancreas: No pancreatic ductal dilation or evidence of acute inflammation.   Spleen: No splenomegaly.   Adrenals/Urinary Tract: No suspicious adrenal nodule/mass. Mild bilateral pelvocaliectasis. Nonobstructive 10 mm stone in the right renal pelvis and a nonobstructive 2 mm stone in the right lower pole kidney. Kidneys demonstrate symmetric enhancement. Urinary bladder is unremarkable for degree of distension.   Stomach/Bowel: Radiopaque enteric contrast material traverses distal loops of small bowel. Stomach is unremarkable for degree of distension. No pathologic dilation of small or large bowel. Colonic diverticulosis without findings of acute diverticulitis.   Vascular/Lymphatic: Aortic atherosclerosis. No enlarged abdominal or pelvic lymph nodes.   Reproductive: Status post hysterectomy. No adnexal masses.   Other: Ventral abdominal wall hernia repair mesh.   Musculoskeletal: Dextroconvex curvature of the lumbar spine with associated degenerative change. Moderate to advanced right hip degenerative change. Left  total hip arthroplasty. Degenerative change of the bilateral SI joints. Chronic osseous changes at the pubic symphysis.   IMPRESSION: 1. No acute abnormality in the abdomen or pelvis. 2. Colonic diverticulosis without findings of acute diverticulitis. 3. Nonobstructive 10 mm stone in the right renal pelvis and a nonobstructive 2 mm stone in the right lower pole kidney. 4. Mild bilateral pelvocaliectasis. 5. Aortic atherosclerosis.   Aortic Atherosclerosis (ICD10-I70.0).     Electronically Signed   By: Reyes Holder M.D.   On: 12/02/2023 07:00  [facility] 10/30/2022    Prox LAD to Mid LAD lesion is 60% stenosed.   1st Diag lesion is 60% stenosed.   Prox RCA lesion is 30% stenosed.   The left ventricular systolic function is normal.   LV end diastolic pressure is mildly elevated.   The left ventricular ejection fraction is 55-65% by visual estimate.   1.  Moderate bifurcation stenosis involving proximal/mid LAD at the origin of first diagonal which also has moderate stenosis.  The LAD stenosis was interrogated by flow reserve evaluation and was not significant with an RFR of 0.94. 2.  Normal LV systolic function. 3.  Mildly elevated left ventricular end-diastolic pressure at 20 mmHg in the setting of sinus tachycardia and significantly elevated blood pressure.   Recommendations: Recommend aggressive medical therapy.  No indication for PCI.  Diagnostic Dominance: Right  Intervention  Assessment & Plan   1.  Nonobstructive CAD-no chest pain today.  Denies exertional chest discomfort.  Previously noted to have mid LAD 60% stenosis and RFR of  0.94 on 10/30/2022. Heart healthy low-sodium diet Maintain physical activity Continue aspirin  and rosuvastatin   Aortic atherosclerosis, hyperlipidemia-patient reassured that her aortic atherosclerosis is an incidental finding.  Reviewed treatment for atherosclerosis.  She expressed understanding. Continue aspirin  and rosuvastatin   Essential  hypertension-BP today 134/86. Maintain blood pressure log Heart healthy low-sodium diet-salty 6 diet sheet given Continue telmisartan  Diabetes-A1c 5.6 on 04/16/23. Carb modified diet Continue metformin Follows with PCP Request labs from pcp  Disposition: Follow-up with Dr. Barbaraann or me in 9-12 months.   Josefa HERO. Leesha Veno NP-C     12/15/2023, 8:53 AM [facility] [address] [place], [place] [address] Office [phone]    Notice: This dictation was prepared with Dragon dictation along with smaller phrase technology. Any transcriptional errors that result from this process are unintentional and may not be corrected upon review.   I spent 14 minutes examining this patient, reviewing medications, and using patient centered shared decision making involving their cardiac care.   I spent  20 minutes reviewing past medical history,  medications, and prior cardiac tests.

## 2023-12-12 NOTE — Telephone Encounter (Signed)
 Spoke with [employer] for several minutes & was informed SIBO should be covered. Pt informed.

## 2023-12-12 NOTE — Telephone Encounter (Signed)
 Per patient, Jillian [name] is requesting a call from our office to discuss SIBO order.   [phone]

## 2023-12-15 ENCOUNTER — Ambulatory Visit

## 2023-12-15 ENCOUNTER — Encounter: Payer: Self-pay | Admitting: General Practice

## 2023-12-15 ENCOUNTER — Ambulatory Visit: Attending: General Practice | Admitting: General Practice

## 2023-12-15 VITALS — BP 134/86 | HR 102 | Ht 64.0 in | Wt 171.2 lb

## 2023-12-15 DIAGNOSIS — R072 Precordial pain: Secondary | ICD-10-CM

## 2023-12-15 DIAGNOSIS — I2510 Atherosclerotic heart disease of native coronary artery without angina pectoris: Secondary | ICD-10-CM

## 2023-12-15 DIAGNOSIS — I700 Atherosclerosis of aorta: Secondary | ICD-10-CM

## 2023-12-15 DIAGNOSIS — Z8249 Family history of ischemic heart disease and other diseases of the circulatory system: Secondary | ICD-10-CM

## 2023-12-15 DIAGNOSIS — E782 Mixed hyperlipidemia: Secondary | ICD-10-CM | POA: Diagnosis not present

## 2023-12-15 NOTE — Patient Instructions (Addendum)
 Medication Instructions:  Your physician recommends that you continue on your current medications as directed. Please refer to the Current Medication list given to you today.  *If you need a refill on your cardiac medications before your next appointment, please call your pharmacy*  Labs Fasting Lipids   Follow-Up: At [facility], you and your health needs are our priority.  As part of our continuing mission to provide you with exceptional heart care, our providers are all part of one team.  This team includes your primary Cardiologist (physician) and Advanced Practice Providers or APPs (Physician Assistants and Nurse Practitioners) who all work together to provide you with the care you need, when you need it.  Your next appointment:   9-12 month(s)  Provider:   Darryle ONEIDA Decent, MD    We recommend signing up for the patient portal called MyChart.  Sign up information is provided on this After Visit Summary.  MyChart is used to connect with patients for Virtual Visits (Telemedicine).  Patients are able to view lab/test results, encounter notes, upcoming appointments, etc.  Non-urgent messages can be sent to your provider as well.   To learn more about what you can do with MyChart, go to [email or web address].   Other Instructions    PLEASE READ AND FOLLOW ATTACHED  SALTY 6

## 2023-12-18 DIAGNOSIS — E782 Mixed hyperlipidemia: Secondary | ICD-10-CM | POA: Diagnosis not present

## 2023-12-19 ENCOUNTER — Ambulatory Visit: Payer: Self-pay | Admitting: General Practice

## 2023-12-19 LAB — LIPID PANEL
Chol/HDL Ratio: 2.1 ratio (ref 0.0–4.4)
Cholesterol, Total: 153 mg/dL (ref 100–199)
HDL: 72 mg/dL (ref >39)
LDL Chol Calc (NIH): 63 mg/dL (ref 0–99)
Triglycerides: 101 mg/dL (ref 0–149)
VLDL Cholesterol Cal: 18 mg/dL (ref 5–40)

## 2023-12-22 DIAGNOSIS — K080 Exfoliation of teeth due to systemic causes: Secondary | ICD-10-CM | POA: Diagnosis not present

## 2023-12-23 NOTE — Telephone Encounter (Signed)
 Inbound call from patient stating that she has more question about SIBO breathe test. Patient is requesting a call back. Please advise.

## 2023-12-24 ENCOUNTER — Ambulatory Visit: Admitting: General Practice

## 2023-12-25 NOTE — Telephone Encounter (Signed)
 Returned call to patient & answered all questions regarding SIBO.

## 2023-12-29 ENCOUNTER — Other Ambulatory Visit: Payer: Self-pay

## 2023-12-29 ENCOUNTER — Ambulatory Visit

## 2023-12-29 DIAGNOSIS — Z85828 Personal history of other malignant neoplasm of skin: Secondary | ICD-10-CM

## 2023-12-29 DIAGNOSIS — D229 Melanocytic nevi, unspecified: Secondary | ICD-10-CM

## 2023-12-29 DIAGNOSIS — L821 Other seborrheic keratosis: Secondary | ICD-10-CM | POA: Diagnosis not present

## 2023-12-29 DIAGNOSIS — L900 Lichen sclerosus et atrophicus: Secondary | ICD-10-CM

## 2023-12-29 DIAGNOSIS — D1801 Hemangioma of skin and subcutaneous tissue: Secondary | ICD-10-CM

## 2023-12-29 DIAGNOSIS — Z1283 Encounter for screening for malignant neoplasm of skin: Secondary | ICD-10-CM

## 2023-12-29 DIAGNOSIS — L814 Other melanin hyperpigmentation: Secondary | ICD-10-CM | POA: Diagnosis not present

## 2023-12-29 DIAGNOSIS — W908XXA Exposure to other nonionizing radiation, initial encounter: Secondary | ICD-10-CM

## 2023-12-29 DIAGNOSIS — L578 Other skin changes due to chronic exposure to nonionizing radiation: Secondary | ICD-10-CM

## 2023-12-29 DIAGNOSIS — L304 Erythema intertrigo: Secondary | ICD-10-CM

## 2023-12-29 MED ORDER — CICLOPIROX 0.77 % EX GEL: CUTANEOUS | 5 refills | Status: AC

## 2023-12-29 MED ORDER — TACROLIMUS 0.1 % EX OINT: TOPICAL_OINTMENT | CUTANEOUS | 5 refills | Status: DC

## 2023-12-29 NOTE — Progress Notes (Signed)
 Subjective   Jillian [name] is a 78 y.o. female who presents for the following: Total body skin exam for skin cancer screening and mole check. The patient has spots, moles and lesions to be evaluated, some may be new or changing and the patient may have concern these could be cancer.. Patient is established patient   Today patient reports: Continued rash in the groin area. Patient has not been using Clobetasol ointment. Thinning skin on the buttock. Patient has been using Balmex as barrier cream on the area.   Review of Systems:    No other skin or systemic complaints except as noted in HPI or Assessment and Plan.  The following portions of the chart were reviewed this encounter and updated as appropriate: medications, allergies, medical history  Relevant Medical History:  Personal history of non melanoma skin cancer - see medical history for full details   Objective  Well appearing patient in no apparent distress; mood and affect are within normal limits. Examination was performed of the: Full Skin Examination: scalp, head, eyes, ears, nose, lips, neck, chest, axillae, abdomen, back, buttocks, bilateral upper extremities, bilateral lower extremities, hands, feet, fingers, toes, fingernails, and toenails.  Genital exam preformed; chaperone present.  Examination notable for: SKIN EXAM, Angioma(s): Scattered red vascular papule(s)  , Lentigo/lentigines: Scattered pigmented macules that are tan to brown in color and are somewhat non-uniform in shape and concentrated in the sun-exposed areas, Nevus/nevi: Scattered well-demarcated, regular, pigmented macule(s) and/or papule(s)  , Seborrheic Keratosis(es): Stuck-on appearing keratotic papule(s) on the trunk, none  irritated with redness, crusting, edema, and/or partial avulsion, Actinic Damage/Elastosis: chronic sun damage: dyspigmentation, telangiectasia, and wrinkling - Hypopigmented plaques with red border with an atrophic appearance on  the vulvar and rectal areas, infrapannus   Examination limited by: Undergarments and Patient deferred removal       Assessment & Plan   SKIN CANCER SCREENING PERFORMED TODAY.  BENIGN SKIN FINDINGS  - Lentigines  - Seborrheic keratoses  - Hemangiomas   - Nevus/Multiple Benign Nevi - Reassurance provided regarding the benign appearance of lesions noted on exam today; no treatment is indicated in the absence of symptoms/changes. - Reinforced importance of photoprotective strategies including liberal and frequent sunscreen use of a broad-spectrum SPF 30 or greater, use of protective clothing, and sun avoidance for prevention of cutaneous malignancy and photoaging.  Counseled patient on the importance of regular self-skin monitoring as well as routine clinical skin examinations as scheduled.   ACTINIC DAMAGE - Chronic condition, secondary to cumulative UV/sun exposure - Recommend daily broad spectrum sunscreen SPF 30+ to sun-exposed areas, reapply every 2 hours as needed.  - Staying in the shade or wearing long sleeves, sun glasses (UVA+UVB protection) and wide brim hats (4-inch brim around the entire circumference of the hat) are also recommended for sun protection.  - Call for new or changing lesions.  Personal history of non melanoma skin cancer  - Reviewed medical history for full details  - Reviewed sun protective measures as above - Encouraged full body skin exams     Lichen sclerosus of rectovaginal area, infrapannus  Chronic and persistent condition with duration or expected duration over one year. Condition is symptomatic/ bothersome to patient. Not currently at goal. - Reports prior bx from [facility] consistent with LS. She is hesitant to do topical steroids d/t fear of skin thinning, did not start after last visit. Limited genital exam today d/t pt mobility but do see features of LS on infrapannus, rectal  area.  - Discussed the nature of the condition and treatment  options with the patient.   - Advised the patient of an increased risk of squamous cell carcinoma of the genital area (~5%) in patients with lichen sclerosus.  She was advised to periodically palpate the labia majora for any new bumps.  We discussed the important of using the topical corticosteroids to minimize this risk. Start tacrolimus ointment 0.1% twice daily on the affected areas Educated about black box warning (and also that recent studies show no increased malignancy risk) and common adverse effects such as burning with application (advised to cool in fridge and only apply to bone-dry skin). Given location of eruption, unsafe to use daily topical CS to area. Patient requires topical calcineurin inhibitor given high risk of dyspigmentation and atrophy from topical CS use in sensitive area.  Intertrigo infrapannus  - Continue Ciclopirox 0.77% gel apply to affected areas twice daily  - continue zeozorb - tacrolimus ointment prn for redness, irritation   Level of service outlined above   Procedures, orders, diagnosis for this visit:  SKIN EXAM FOR MALIGNANT NEOPLASM   LENTIGO   SEBORRHEIC KERATOSIS   CHERRY ANGIOMA   MULTIPLE BENIGN NEVI   ACTINIC SKIN DAMAGE   LICHEN SCLEROSUS ET ATROPHICUS    Skin exam for malignant neoplasm  Lentigo  Seborrheic keratosis  Cherry angioma  Multiple benign nevi  Actinic skin damage  Lichen sclerosus et atrophicus  Other orders -     Tacrolimus; Apply 2 grams twice daily to affected areas of skin  Dispense: 100 g; Refill: 5 -     Ciclopirox; Apply to affected area twice daily  Dispense: 45 g; Refill: 5    Return to clinic: Return in about 6 months (around 06/27/2024) for LS.  I, Emerick Ege, CMA am acting as scribe for Lauraine JAYSON Kanaris, MD.   Documentation: I have reviewed the above documentation for accuracy and completeness, and I agree with the above.  Lauraine JAYSON Kanaris, MD

## 2023-12-29 NOTE — Patient Instructions (Addendum)
 Moisturizer: Apply a moisturizer throughout the day and after bathing.  When you moisturize after bathing, this locks in the moisture.  This can lead to softer and smoother skin.  Body moisturizers come in ointments, creams, and lotions.  If you have dry skin, we recommend the use of ointments or creams rather than lotions.  In other words, something you scoop out of a jar rather than squirted out.  Ointments and creams are thicker and thus provide better moisturization.      Moisturizers Apply a moisturizer to your skin at least once a day (even if you do not bathe).   Cool moisturizers on your skin help with itching (to accomplish this, place your moisturizers and medicated creams in the refrigerator). In general, people with dry skin need a moisturizer that is scooped and not squirted.  - Ointments (petrolatum ointment): greasy, but are the best moisturizers Vaseline, Aquaphor  - Creams (thick, white cream that comes in a jar and is scooped with your hand) Cerave, Cetaphil, Eucerin, Vanicream  - Lotions (comes in a pump) is the weakest moisturizer but is an acceptable choice for the face if you have an oily face Cetaphil, Cerave, Curel, Neutrogena, Lubriderm, Aveeno     Due to recent changes in healthcare laws, you may see results of your pathology and/or laboratory studies on MyChart before the doctors have had a chance to review them. We understand that in some cases there may be results that are confusing or concerning to you. Please understand that not all results are received at the same time and often the doctors may need to interpret multiple results in order to provide you with the best plan of care or course of treatment. Therefore, we ask that you please give us  2 business days to thoroughly review all your results before contacting the office for clarification. Should we see a critical lab result, you will be contacted sooner.   If You Need Anything After Your Visit  If  you have any questions or concerns for your doctor, please call our main line at [phone] and press option 4 to reach your doctor's medical assistant. If no one answers, please leave a voicemail as directed and we will return your call as soon as possible. Messages left after 4 pm will be answered the following business day.   You may also send us  a message via MyChart. We typically respond to MyChart messages within 1-2 business days.  For prescription refills, please ask your pharmacy to contact our office. Our fax number is [phone].  If you have an urgent issue when the clinic is closed that cannot wait until the next business day, you can page your doctor at the number below.    Please note that while we do our best to be available for urgent issues outside of office hours, we are not available 24/7.   If you have an urgent issue and are unable to reach us , you may choose to seek medical care at your doctor's office, retail clinic, urgent care center, or emergency room.  If you have a medical emergency, please immediately call 911 or go to the emergency department.  Pager Numbers  - Dr. Hester: [phone]  - Dr. Jackquline: [phone]  - Dr. Claudene: [phone]   - Dr. Raymund: [phone]  In the event of inclement weather, please call our main line at [phone] for an update on the status of any delays or closures.  Dermatology Medication Tips: Please keep the boxes  that topical medications come in in order to help keep track of the instructions about where and how to use these. Pharmacies typically print the medication instructions only on the boxes and not directly on the medication tubes.   If your medication is too expensive, please contact our office at [phone] option 4 or send us  a message through MyChart.   We are unable to tell what your co-pay for medications will be in advance as this is different depending on your insurance coverage. However, we may  be able to find a substitute medication at lower cost or fill out paperwork to get insurance to cover a needed medication.   If a prior authorization is required to get your medication covered by your insurance company, please allow us  1-2 business days to complete this process.  Drug prices often vary depending on where the prescription is filled and some pharmacies may offer cheaper prices.  The website www.goodrx.com contains coupons for medications through different pharmacies. The prices here do not account for what the cost may be with help from insurance (it may be cheaper with your insurance), but the website can give you the price if you did not use any insurance.  - You can print the associated coupon and take it with your prescription to the pharmacy.  - You may also stop by our office during regular business hours and pick up a GoodRx coupon card.  - If you need your prescription sent electronically to a different pharmacy, notify our office through [facility] or by phone at [phone] option 4.     Si Usted Necesita Algo Despus de Su Visita  Tambin puede enviarnos un mensaje a travs de Clinical cytogeneticist. Por lo general respondemos a los mensajes de MyChart en el transcurso de 1 a 2 das hbiles.  Para renovar recetas, por favor pida a su farmacia que se ponga en contacto con nuestra oficina. Randi lakes de fax es [place] [phone].  Si tiene un asunto urgente cuando la clnica est cerrada y que no puede esperar hasta el siguiente da hbil, puede llamar/localizar a su doctor(a) al nmero que aparece a continuacin.   Por favor, tenga en cuenta que aunque hacemos todo lo posible para estar disponibles para asuntos urgentes fuera del horario de [place], no estamos disponibles las 24 horas del da, los 7 [address]  [address] de la [place].   Si tiene un problema urgente y no puede comunicarse con nosotros, puede optar por buscar atencin mdica  en el consultorio de su doctor(a), en una  clnica privada, en un centro de atencin urgente o en una sala de emergencias.  Si tiene Engineer, drilling, por favor llame inmediatamente al 911 o vaya a la sala de emergencias.  Nmeros de bper  - Dr. Hester: [phone]  - Dra. Jackquline: [phone]  - Dr. Claudene: [phone]  - Dra. Kitts: [phone]  En caso de inclemencias del [place], por favor llame a nuestra lnea principal al [phone] para una actualizacin sobre el estado de cualquier retraso o cierre.  Consejos para la medicacin en dermatologa: Por favor, guarde las cajas en las que vienen los medicamentos de uso tpico para ayudarle a seguir las instrucciones sobre dnde y cmo usarlos. Las farmacias generalmente imprimen las instrucciones del medicamento slo en las cajas y no directamente en los tubos del [place].   Si su medicamento es muy caro, por favor, pngase en contacto con landry rieger llamando al [phone] y presione la opcin 4 o envenos un mensaje a  travs de MyChart.   No podemos decirle cul ser su copago por los medicamentos por adelantado ya que esto es diferente dependiendo de la cobertura de su seguro. Sin embargo, es posible que podamos encontrar un medicamento sustituto a Audiological scientist un formulario para que el seguro cubra el medicamento que se considera necesario.   Si se requiere una autorizacin previa para que su compaa de seguros malta su medicamento, por favor permtanos de 1 a 2 das hbiles para completar este proceso.  Los precios de los medicamentos varan con frecuencia dependiendo del Environmental consultant de dnde se surte la receta y alguna farmacias pueden ofrecer precios ms baratos.  El sitio web www.goodrx.com tiene cupones para medicamentos de Health and safety inspector. Los precios aqu no tienen en cuenta lo que podra costar con la ayuda del seguro (puede ser ms barato con su seguro), pero el sitio web puede darle el precio si no utiliz Tourist information centre manager.  - Puede  imprimir el cupn correspondiente y llevarlo con su receta a la farmacia.  - Tambin puede pasar por nuestra oficina durante el horario de atencin regular y Education officer, museum una tarjeta de cupones de GoodRx.  - Si necesita que su receta se enve electrnicamente a una farmacia diferente, informe a nuestra oficina a travs de MyChart de [place] o por telfono llamando al [phone] y presione la opcin 4.

## 2024-01-05 ENCOUNTER — Telehealth: Payer: Self-pay

## 2024-01-05 NOTE — Telephone Encounter (Signed)
 Patient called the office asking for Clobetasol refill. Advised patient she was to start using Tacrolimus Ointment. Patient states she has done research on Tacrolimus and does not want to use due to potential side effects.   Please advise.

## 2024-01-06 ENCOUNTER — Other Ambulatory Visit: Payer: Self-pay

## 2024-01-06 MED ORDER — CLOBETASOL PROPIONATE 0.05 % EX OINT: TOPICAL_OINTMENT | CUTANEOUS | 1 refills | Status: DC

## 2024-01-06 NOTE — Telephone Encounter (Signed)
 Clobetasol sent in and left message for patient to return my call. aw

## 2024-01-06 NOTE — Telephone Encounter (Signed)
 Patient advised of information per Dr. Raymund. aw

## 2024-01-13 ENCOUNTER — Other Ambulatory Visit: Payer: Self-pay

## 2024-01-13 MED ORDER — CLOBETASOL PROPIONATE 0.05 % EX OINT: TOPICAL_OINTMENT | CUTANEOUS | 1 refills | Status: AC

## 2024-01-13 NOTE — Telephone Encounter (Signed)
 Vernell with [employer] is calling to get the order form for SIBO test.  Please fax to [phone]

## 2024-01-14 NOTE — Telephone Encounter (Signed)
 Patient called requesting a call from the nurse. She has questions regarding insurance with the SIBO. Please advise.

## 2024-01-14 NOTE — Telephone Encounter (Signed)
 [facility] SIBO order form completed and faxed back with patient's insurance information (F: [phone]) as requested.

## 2024-01-14 NOTE — Telephone Encounter (Signed)
 Returned call to patient. She could not remember if Nyla, RN spoke with [facility] about coverage for SIBO test. Informed patient that based on 12/12/23 note, Kiara, RN did speak with [employer] and stated that SIBO test should be covered. Patient had no other concerns.

## 2024-01-30 ENCOUNTER — Telehealth: Payer: Self-pay | Admitting: Physician Assistant

## 2024-01-30 NOTE — Telephone Encounter (Signed)
 Discussed results with patient & she still continues to have gas daily and loose bowel movements. She's taking miralax  BID in order to have a bowel movement & gas X OTC. She would like to schedule an OV, but prefers to call back and schedule once she gets home.

## 2024-01-30 NOTE — Telephone Encounter (Signed)
 Area of diagnostic test shows hydrogen to PPM, methane 1 ppm and increasing combined hydrogen and methane 3 ppm which is normal.  Bacterial overgrowth is not suspected in this study. 11/26/2023 CT abdomen pelvis unremarkable  11/20/2023 KUB small volume formed stool in colon, sed rate, lipase, CBC normal  Please see how patient is doing currently, schedule follow-up with Dr. Abran pod or Dr. Abran if she is continuing to have symptoms.

## 2024-02-16 ENCOUNTER — Encounter: Payer: Self-pay | Admitting: Physician Assistant

## 2024-03-01 ENCOUNTER — Other Ambulatory Visit: Payer: Self-pay | Admitting: Internal Medicine

## 2024-03-01 DIAGNOSIS — Z1231 Encounter for screening mammogram for malignant neoplasm of breast: Secondary | ICD-10-CM

## 2024-03-09 NOTE — Progress Notes (Unsigned)
 "    03/10/2024 Zoriana Tesha Archambeau [id number] [birth year]  Referring provider: Shepard Ade, MD Primary GI doctor: Dr. Abran  ASSESSMENT AND PLAN:  nausea without vomiting worse with eating too much, appetite improved, rare GERD, bloating/gas 07/2020 Colonoscopy diverticulosis with rectosigmoid stenosis 77/25/2025 if KUB small volume formed stool in the colon no bowel obstruction right renal calculus 77/02/2023 CTAP W no acute abnormality abdomen pelvis, diverticulosis without diverticulitis nonobstructing 10 mm stone in right renal pelvis and nonobstructing 2 mm stone right lower pole kidney 77/06/2023 SIBO testing negative Most likely from constipation/pelvic floor/possible GERD - add on pepcid nightly - will optimize bowels - consider pelvic floor PT - if this does not help consider EGD/and or colon, ideally follow up with Dr. Abran - consider GES  Constipation with stress urinary incontinence, rectosigmoid stenosis on colonoscopy 2022 due to diverticulosis, worsened with hip surgery in march and decreased movement Negative FOBT, mix of soft and hard stool but no fecal impaction on rectal exam today, decreased rectal tone 77/17/2024 CTAP extensive diverticulosis no signs of inflammation, mild right 7 mm stone within right renal pelvis, lumbar scoliosis, DJD bilateral hips Failed: MiraLAX  2 capfuls daily without help, has not had BM x 4 days, linzess  caused diarrhea I still think this is likely constipation with overflow versus/and pelvic floor dysfunction and/or rectosigmoid stenosis, worsening with decreased activity from her back, no signs of obstruction. - do trial of citracel ( can not tolerate metamucil/benefiber) - bowel purge with amitiza  8 mcg BID - strongly suggest pelvic PT referral, declines at this time  Screening colonoscopy Colonoscopy [place] 2022 showed only diverticulosis with rectosigmoid stenosis.   CAD S/p cath 77/2024 EF 55-65%, LAD stenosis not  significant  Patient Care Team: Shepard Ade, MD as PCP - General (Internal Medicine) O'Neal, Darryle Ned, MD as PCP - Cardiology (Cardiology) Yvone Rush, MD as Consulting Physician (Orthopedic Surgery) Cesario Boer, MD as Attending Physician (Physical Medicine and Rehabilitation)  HISTORY OF PRESENT ILLNESS: 79 y.o. female with a past medical history listed below presents for evaluation of bloating/diarrhea.   Last seen in the office 11/20/2023 by myself for abdominal pain bloating and diarrhea.  Patient was having a week of fatigue bloating diarrhea nausea without vomiting symptoms were resolving, consider KUB/cross-sectional imaging and stool studies if returns.  Discussed the use of AI scribe software for clinical note transcription with the patient, who gave verbal consent to proceed.  History of Present Illness   Hiyab Ornella Coderre is a 79 year old female with chronic idiopathic constipation and pelvic floor dysfunction who presents for follow-up of refractory constipation.  Constipation has been longstanding with recent worsening, characterized by up to four days without a bowel movement followed by a day of multiple episodes of loose, watery stools. Weight gain of five pounds has been noted. MiraLAX  at two capfuls has been ineffective; prior use of Linzess  resulted in immediate watery diarrhea and incontinence. Milk of magnesia produces watery stools but does not provide satisfactory relief. Previous trials of fiber supplements include Metamucil, Benefiber,; Benefiber caused a choking sensation, and tolerance to metamucil is uncertain. No recent bowel purge, though one was performed in 2024. No blood in stool.  Frequent passage of gas, especially when standing, is socially embarrassing. Significant bloating and abdominal distension are present, with the abdomen described as very hard. Gas passage is possible, and there is no vomiting. Nausea occurs frequently, particularly after  eating more than a small meal. Early satiety and decreased appetite have been noted, though  appetite has recently improved. Abdominal pain and tenderness are present, especially when pushing during bowel movements, and the abdomen is very touchy to palpation.  History of two hernia repairs with mesh placement; concern exists that mesh may contribute to symptoms. Prior CT imaging shows diverticulosis without evidence of diverticulitis, inflammation, or obstruction. Lichen sclerosus is present, with frequent skin breakdown and bleeding in the groin area, managed with mupirocin.  Occasional difficulty swallowing pills is intermittent and not associated with food. No consistent dysphagia and no food or pills getting consistently stuck in the throat.  Stress urinary incontinence occurs with coughing or sneezing. Sleep is in a lift chair due to back pain and scoliosis. Mobility is limited since hip replacement in March, making walking and exercise difficult. Water  intake is low due to dislike. Weight gain since the holidays is attributed to increased food intake while hosting family.  Concern about memory loss with increasing forgetfulness regarding words and dates. History of elevated B12 levels led to hematology workup, which was negative; B12 supplementation is avoided. Chronic stress and poor sleep are reported.      She  reports that she has never smoked. She has never used smokeless tobacco. She reports current alcohol  use. She reports that she does not use drugs.  RELEVANT GI HISTORY, IMAGING AND LABS: Results   Labs Vitamin B12 (2021): 800  Radiology Abdominal MRI: No evidence of bowel obstruction. Abdominal x-ray: Fecal retention; no evidence of bowel obstruction; surgical mesh present. Abdominal CT (October 2025): Diverticulosis; two non-obstructing nephroliths; no evidence of inflammation or obstruction.      CBC    Component Value Date/Time   WBC 8.1 11/20/2023 1147   RBC 4.60  11/20/2023 1147   HGB 13.2 11/20/2023 1147   HCT 40.4 11/20/2023 1147   PLT 291.0 11/20/2023 1147   MCV 88.0 11/20/2023 1147   MCH 29.0 04/30/2023 0345   MCHC 32.8 11/20/2023 1147   RDW 15.0 11/20/2023 1147   LYMPHSABS 1.9 11/20/2023 1147   MONOABS 0.6 11/20/2023 1147   EOSABS 0.1 11/20/2023 1147   BASOSABS 0.1 11/20/2023 1147   Recent Labs    04/21/23 1111 04/29/23 0343 04/30/23 0345 11/20/23 1147  HGB 13.3 11.4* 10.9* 13.2    CMP     Component Value Date/Time   NA 140 11/20/2023 1147   K 4.2 11/20/2023 1147   CL 103 11/20/2023 1147   CO2 27 11/20/2023 1147   GLUCOSE 107 (H) 11/20/2023 1147   BUN 19 11/20/2023 1147   CREATININE 0.84 11/20/2023 1147   CALCIUM  10.1 11/20/2023 1147   PROT 8.0 11/20/2023 1147   PROT 7.5 07/09/2019 1025   ALBUMIN 4.6 11/20/2023 1147   AST 22 11/20/2023 1147   ALT 12 11/20/2023 1147   ALKPHOS 76 11/20/2023 1147   BILITOT 0.6 11/20/2023 1147   GFRNONAA >60 04/29/2023 0343   GFRAA >60 11/26/2018 0737      Latest Ref Rng & Units 11/20/2023   11:47 AM 10/30/2022    1:11 AM 07/09/2019   10:25 AM  Hepatic Function  Total Protein 6.0 - 8.3 g/dL 8.0  7.0  7.5   Albumin 3.5 - 5.2 g/dL 4.6  3.9    AST 0 - 37 U/L 22  24    ALT 0 - 35 U/L 12  13    Alk Phosphatase 39 - 117 U/L 76  60    Total Bilirubin 0.2 - 1.2 mg/dL 0.6  0.9        Current Medications:  Current Outpatient Medications (Endocrine & Metabolic):    metFORMIN (GLUCOPHAGE-XR) 500 MG 24 hr tablet, Take 1,000 mg by mouth 2 (two) times daily.   EUTHYROX  137 MCG tablet, Take 137 mcg by mouth daily before breakfast. (Patient not taking: Reported on 03/10/2024)   predniSONE  (DELTASONE ) 50 MG tablet, Give 13 hours, 7 hours, and 1 hour prior to contrast dye injection. Notify pharmacy to schedule dose as appropriate based on procedure date/time. (Patient not taking: Reported on 03/10/2024)  Current Outpatient Medications (Cardiovascular):    rosuvastatin  (CRESTOR ) 20 MG tablet, Take 20  mg by mouth daily.   telmisartan (MICARDIS) 80 MG tablet, Take 80 mg by mouth daily before breakfast.   furosemide (LASIX) 40 MG tablet, Take 40 mg by mouth as needed. (Patient not taking: Reported on 03/10/2024)  Current Outpatient Medications (Respiratory):    fexofenadine (ALLEGRA) 180 MG tablet, Take 180 mg by mouth daily as needed for allergies or rhinitis.   cetirizine (ZYRTEC) 10 MG tablet, Take 10 mg by mouth daily. (Patient not taking: Reported on 03/10/2024)   fluticasone (FLONASE) 50 MCG/ACT nasal spray, 2 spray in each nostril Nasally daily; Duration: 30 days (Patient not taking: Reported on 03/10/2024)  Current Outpatient Medications (Analgesics):    aspirin  81 MG chewable tablet, Chew by mouth daily.  Current Outpatient Medications (Other):    ALPRAZolam (XANAX) 0.25 MG tablet, Take 0.25 mg by mouth as needed.   cyclobenzaprine  (FLEXERIL ) 10 MG tablet, Take 10 mg by mouth 3 (three) times daily as needed for muscle spasms.   lubiprostone  (AMITIZA ) 8 MCG capsule, Take 1 capsule (8 mcg total) by mouth 2 (two) times daily with a meal.   magnesium  hydroxide (MILK OF MAGNESIA) 400 MG/5ML suspension, Take 15 mLs by mouth daily as needed for moderate constipation.   ciclopirox  (LOPROX ) 0.77 % cream, Apply topically 2 (two) times daily. (Patient not taking: Reported on 03/10/2024)   Ciclopirox  0.77 % gel, Apply to affected area twice daily (Patient not taking: Reported on 03/10/2024)   clobetasol  ointment (TEMOVATE ) 0.05 %, APPLY 1 gram TO VULVAR AREA TWICE DAILY FOR FOUR TO SIX WEEKS. THEN TRANSITION TO THREE TIMES WEEKLY. CAN INCREASE AS NEEDED FOR FLARES (Patient not taking: Reported on 03/10/2024)   famotidine (PEPCID) 20 MG tablet, Take 20 mg by mouth as needed. (Patient not taking: Reported on 03/10/2024)   Menthol -Zinc Oxide (CALMOSEPTINE) 0.44-20.6 % OINT, Apply topically as needed. (Patient not taking: Reported on 03/10/2024)   miconazole  (MICOTIN) 2 % powder, Apply topically as needed  for itching. (Patient not taking: Reported on 03/10/2024)   omeprazole  (PRILOSEC OTC) 20 MG tablet, Take 20 mg by mouth daily. (Patient not taking: Reported on 03/10/2024)   ondansetron  (ZOFRAN ) 4 MG tablet, Take 1 tablet (4 mg total) by mouth every 6 (six) hours as needed for nausea. (Patient not taking: Reported on 03/10/2024)   ondansetron  (ZOFRAN -ODT) 4 MG disintegrating tablet, Take 4 mg by mouth 2 (two) times daily. (Patient not taking: Reported on 03/10/2024)   polyethylene glycol powder (GLYCOLAX /MIRALAX ) 17 GM/SCOOP powder, Take 1 dose once to twice daily by mouth in 8 ounces of fluid (Patient not taking: Reported on 03/10/2024)   Simethicone (GAS-X PO), Take by mouth. (Patient not taking: Reported on 03/10/2024)   tacrolimus  (PROTOPIC ) 0.1 % ointment, APPLY TWO GRAM TO THE AFFECTED AREAS OF SKIN TWICE DAILY FOR A 30 DAY SUPPLY (Patient not taking: Reported on 03/10/2024)  Medical History:  Past Medical History:  Diagnosis Date   Allergy     Anxiety  Basal cell carcinoma    Nose treated in the past w/MOHS   Bowel obstruction (HCC)    Cancer (HCC)    skin cancer had Mohs Surgery   Cataract    Coronary artery disease    Diabetes mellitus without complication (HCC)    Diverticulitis    GERD (gastroesophageal reflux disease)    Gout    H/O unstable angina    Headache    High cholesterol    History of kidney stones    Hypertension    Hypothyroidism    IBS (irritable bowel syndrome)    IBS (irritable bowel syndrome)    Neck pain    PONV (postoperative nausea and vomiting)    Renovascular hypertension    Spondylosis of lumbar spine    Allergies:  Allergies  Allergen Reactions   Hydrocodone-Acetaminophen  Itching   Nortriptyline Anxiety and Palpitations   Oxycodone  Itching   Metronidazole Nausea And Vomiting    Flu like symptoms    Sulfamethoxazole Other (See Comments)    Flu-like symptoms.   Amoxicillin     Facial Numbness   Chlorhexidine  Itching    Redness Burning    Luminal [Phenobarbital] Other (See Comments)    Blisters   Mobic [Meloxicam]    Onion     Raw onion causes headaches    Penicillins     Unknown reaction Tolerated Cephalosporin Date: 04/29/23.     Allopurinol Rash   Ciprofloxacin Rash   Doxycycline Rash   Iodinated Contrast Media Rash   Latex Rash   Tape Rash    Adhesive tape   Toradol [Ketorolac Tromethamine] Palpitations    sweating     Surgical History:  She  has a past surgical history that includes Abdominal hysterectomy; Tonsillectomy; Hernia repair; Cholecystectomy; Cardiac catheterization; Wrist fracture surgery (Right); Appendectomy; Radiofrequency ablation nerves (03/2020); Exploratory laparotomy; Breast biopsy; LEFT HEART CATH AND CORONARY ANGIOGRAPHY (N/A, 10/30/2022); CORONARY PRESSURE/FFR STUDY (N/A, 10/30/2022); Bladder surgery; Wrist ganglion excision (Right); Eye surgery (Bilateral); and Total hip arthroplasty (Left, 04/28/2023). Family History:  Her family history includes Breast cancer in her maternal aunt; Colon cancer in her paternal grandmother; Heart attack in her father.  REVIEW OF SYSTEMS  : All other systems reviewed and negative except where noted in the History of Present Illness.  PHYSICAL EXAM: BP 122/70   Pulse 92   Ht 5' 4 (1.626 m)   Wt 178 lb (80.7 kg)   BMI 30.55 kg/m  Physical Exam   GENERAL APPEARANCE: elderly appearing, in no apparent distress. HEENT: No cervical lymphadenopathy, unremarkable thyroid , sclerae anicteric, conjunctiva pink. RESPIRATORY: Respiratory effort normal, breath sounds equal bilaterally without rales, rhonchi, or wheezing. CARDIO: Regular rate and rhythm with no murmurs, rubs, or gallops, peripheral pulses intact. ABDOMEN: Slightly distended but soft, decreased bowel sounds, no tenderness to palpation, no rebound, no mass appreciated. RECTAL: Stool present in rectum, hard stool present, small internal hemorrhoid noted. Negative FOBT MUSCULOSKELETAL: Full range of  motion, antalgic gait but able to get on table with help, scoliosiswithout edema. SKIN: Dry, intact without rashes or lesions. No jaundice. NEURO: Alert, oriented, no focal deficits. PSYCH: Cooperative, normal mood and affect.           Alan JONELLE Coombs, PA-C 11:50 AM   "

## 2024-03-10 ENCOUNTER — Encounter: Payer: Self-pay | Admitting: Physician Assistant

## 2024-03-10 ENCOUNTER — Ambulatory Visit: Admitting: Physician Assistant

## 2024-03-10 VITALS — BP 122/70 | HR 92 | Ht 64.0 in | Wt 178.0 lb

## 2024-03-10 DIAGNOSIS — R110 Nausea: Secondary | ICD-10-CM

## 2024-03-10 DIAGNOSIS — K5790 Diverticulosis of intestine, part unspecified, without perforation or abscess without bleeding: Secondary | ICD-10-CM

## 2024-03-10 DIAGNOSIS — I2510 Atherosclerotic heart disease of native coronary artery without angina pectoris: Secondary | ICD-10-CM | POA: Diagnosis not present

## 2024-03-10 DIAGNOSIS — R194 Change in bowel habit: Secondary | ICD-10-CM

## 2024-03-10 DIAGNOSIS — R140 Abdominal distension (gaseous): Secondary | ICD-10-CM

## 2024-03-10 DIAGNOSIS — K5904 Chronic idiopathic constipation: Secondary | ICD-10-CM | POA: Diagnosis not present

## 2024-03-10 DIAGNOSIS — K219 Gastro-esophageal reflux disease without esophagitis: Secondary | ICD-10-CM | POA: Diagnosis not present

## 2024-03-10 DIAGNOSIS — R1084 Generalized abdominal pain: Secondary | ICD-10-CM

## 2024-03-10 MED ORDER — LUBIPROSTONE 8 MCG PO CAPS: 8.0000 ug | ORAL_CAPSULE | Freq: Two times a day (BID) | ORAL | 0 refills | Status: DC

## 2024-03-10 NOTE — Patient Instructions (Addendum)
 " BOWEL PURGE:  I am recommending a bowel purge to aid in cleaning out your bowels.   OVER THE COUNTER SHOPPING GUIDE:  Purchase 1 (one) 119 GRAM bottle of Miralax  Purchase 1 (one) box of 5 mg Dulcolax tablets Purchase 1 (one) 32 ounce bottle of Gatorade  STEPS:  In the morning, mix the entire bottle of Miralax  in the 32 ounces of Gatorade, then refrigerate to dissolve completely. Take 4 dulcolax tablets, then wait 1 hour After 1 hour, drink the Miralax  solution you have prepared over the next 2-3 hours. You should expect results within 1 to 6 hours after completing this bowel purge. Go to the ER if you have severe AB pain, can not pass gas or stool in over 12 hours, can not hold down any food.  BOWEL PURGE:  This will aid in cleaning out your bowels   Add on the citracel 1-2 TBSP a day The day after the bowel purge, start the amitiza  8 mcg twice a day, take with or after food.  Do trial of the famotidine 20 mg every night before bed for 30 days or one month  Toileting tips to help with your constipation - Drink at least 64-80 ounces of water /liquid per day. - Establish a time to try to move your bowels every day.  For many people, this is after a cup of coffee or after a meal such as breakfast. - Sit all of the way back on the toilet keeping your back fairly straight and while sitting up, try to rest the tops of your forearms on your upper thighs.   - Raising your feet with a step stool/squatty potty can be helpful to improve the angle that allows your stool to pass through the rectum. - Relax the rectum feeling it bulge toward the toilet water .  If you feel your rectum raising toward your body, you are contracting rather than relaxing. - Breathe in and slowly exhale. Belly breath by expanding your belly towards your belly button. Keep belly expanded as you gently direct pressure down and back to the anus.  A low pitched GRRR sound can assist with increasing intra-abdominal  pressure.  (Can also trying to blow on a pinwheel and make it move, this helps with the same belly breathing) - Repeat 3-4 times. If unsuccessful, contract the pelvic floor to restore normal tone and get off the toilet.  Avoid excessive straining. - To reduce excessive wiping by teaching your anus to normally contract, place hands on outer aspect of knees and resist knee movement outward.  Hold 5-10 second then place hands just inside of knees and resist inward movement of knees.  Hold 5 seconds.  Repeat a few times each way.  Go to the ER if unable to pass gas, severe AB pain, unable to hold down food, any shortness of breath of chest pain.   Here some information about pelvic floor dysfunction. This may be contributing to some of your symptoms. We could also refer to pelvic floor physical therapy.   Pelvic Floor Dysfunction, Female Pelvic floor dysfunction (PFD) is a condition that results when the group of muscles and connective tissues that support the organs in the pelvis (pelvic floor muscles) do not work well. These muscles and their connections form a sling that supports the colon and bladder. In women, they also support the uterus. PFD causes pelvic floor muscles to be too weak, too tight, or both. In PFD, muscle movements are not coordinated. This may cause bowel  or bladder problems. It may also cause pain. What are the causes? This condition may be caused by an injury to the pelvic area or by a weakening of pelvic muscles. This often results from pregnancy and childbirth or other types of strain. In many cases, the exact cause is not known. What increases the risk? The following factors may make you more likely to develop this condition: Having chronic bladder tissue inflammation (interstitial cystitis). Being an older person. Being overweight. History of radiation treatment for cancer in the pelvic region. Previous pelvic surgery, such as removal of the uterus  (hysterectomy). What are the signs or symptoms? Symptoms of this condition vary and may include: Bladder symptoms, such as: Trouble starting urination and emptying the bladder. Frequent urinary tract infections. Leaking urine when coughing, laughing, or exercising (stress incontinence). Having to pass urine urgently or frequently. Pain when passing urine. Bowel symptoms, such as: Constipation. Urgent or frequent bowel movements. Incomplete bowel movements. Painful bowel movements. Leaking stool or gas. Unexplained genital or rectal pain. Genital or rectal muscle spasms. Low back pain. Other symptoms may include: A heavy, full, or aching feeling in the vagina. A bulge that protrudes into the vagina. Pain during or after sex. How is this diagnosed? This condition may be diagnosed based on: Your symptoms and medical history. A physical exam. During the exam, your health care provider may check your pelvic muscles for tightness, spasm, pain, or weakness. This may include a rectal exam and a pelvic exam. In some cases, you may have diagnostic tests, such as: Electrical muscle function tests. Urine flow testing. X-ray tests of bowel function. Ultrasound of the pelvic organs. How is this treated? Treatment for this condition depends on the symptoms. Treatment options include: Physical therapy. This may include Kegel exercises to help relax or strengthen the pelvic floor muscles. Biofeedback. This type of therapy provides feedback on how tight your pelvic floor muscles are so that you can learn to control them. Internal or external massage therapy. A treatment that involves electrical stimulation of the pelvic floor muscles to help control pain (transcutaneous electrical nerve stimulation, or TENS). Sound wave therapy (ultrasound) to reduce muscle spasms. Medicines, such as: Muscle relaxants. Bladder control medicines. Surgery to reconstruct or support pelvic floor muscles may be an  option if other treatments do not help. Follow these instructions at home: Activity Do your usual activities as told by your health care provider. Ask your health care provider if you should modify any activities. Do pelvic floor strengthening or relaxing exercises at home as told by your physical therapist. Lifestyle Maintain a healthy weight. Eat foods that are high in fiber, such as beans, whole grains, and fresh fruits and vegetables. Limit foods that are high in fat and processed sugars, such as fried or sweet foods. Manage stress with relaxation techniques such as yoga or meditation. General instructions If you have problems with leakage: Use absorbable pads or wear padded underwear. Wash frequently with mild soap. Keep your genital and anal area as clean and dry as possible. Ask your health care provider if you should try a barrier cream to prevent skin irritation. Take warm baths to relieve pelvic muscle tension or spasms. Take over-the-counter and prescription medicines only as told by your health care provider. Keep all follow-up visits. How is this prevented? The cause of PFD is not always known, but there are a few things you can do to reduce the risk of developing this condition, including: Staying at a healthy weight. Getting  regular exercise. Managing stress. Contact a health care provider if: Your symptoms are not improving with home care. You have signs or symptoms of PFD that get worse at home. You develop new signs or symptoms. You have signs of a urinary tract infection, such as: Fever. Chills. Increased urinary frequency. A burning feeling when urinating. You have not had a bowel movement in 3 days (constipation). Summary Pelvic floor dysfunction results when the muscles and connective tissues in your pelvic floor do not work well. These muscles and their connections form a sling that supports your colon and bladder. In women, they also support the  uterus. PFD may be caused by an injury to the pelvic area or by a weakening of pelvic muscles. PFD causes pelvic floor muscles to be too weak, too tight, or a combination of both. Symptoms may vary from person to person. In most cases, PFD can be treated with physical therapies and medicines. Surgery may be an option if other treatments do not help. This information is not intended to replace advice given to you by your health care provider. Make sure you discuss any questions you have with your health care provider. Document Revised: 06/21/2020 Document Reviewed: 06/21/2020 Elsevier Patient Education  2022 [employer]  _______________________________________________________  If your blood pressure at your visit was 140/90 or greater, please contact your primary care physician to follow up on this.  _______________________________________________________  If you are age 77 or older, your body mass index should be between 23-30. Your Body mass index is 30.55 kg/m. If this is out of the aforementioned range listed, please consider follow up with your Primary Care Provider.  If you are age 52 or younger, your body mass index should be between 19-25. Your Body mass index is 30.55 kg/m. If this is out of the aformentioned range listed, please consider follow up with your Primary Care Provider.   ________________________________________________________  The [facility] providers would like to encourage you to use MYCHART to communicate with providers for non-urgent requests or questions.  Due to long hold times on the telephone, sending your provider a message by [facility] may be a faster and more efficient way to get a response.  Please allow 48 business hours for a response.  Please remember that this is for non-urgent requests.  _______________________________________________________  [facility] is using a team-based approach to care.  Your team is made up of your doctor and two  to three APPS. Our APPS (Nurse Practitioners and Physician Assistants) work with your physician to ensure care continuity for you. They are fully qualified to address your health concerns and develop a treatment plan. They communicate directly with your gastroenterologist to care for you. Seeing the Advanced Practice Practitioners on your physician's team can help you by facilitating care more promptly, often allowing for earlier appointments, access to diagnostic testing, procedures, and other specialty referrals.   Thank you for trusting me with your gastrointestinal care. Alan Coombs, PA-C  "

## 2024-03-10 NOTE — Progress Notes (Signed)
 Noted

## 2024-03-15 ENCOUNTER — Telehealth: Payer: Self-pay | Admitting: Physician Assistant

## 2024-03-15 NOTE — Telephone Encounter (Signed)
 Incoming call from pt regarding rx lubiprostone  (AMITIZA ). Pt stated the medication is too expensive and is requesting samples or cheaper treatment. Please advise. Thank you.

## 2024-03-16 ENCOUNTER — Other Ambulatory Visit: Payer: Self-pay | Admitting: Gastroenterology

## 2024-03-16 DIAGNOSIS — K5904 Chronic idiopathic constipation: Secondary | ICD-10-CM

## 2024-03-16 MED ORDER — TRULANCE 3 MG PO TABS: 3.0000 mg | ORAL_TABLET | Freq: Every day | ORAL | 0 refills | Status: AC

## 2024-03-16 NOTE — Progress Notes (Signed)
 Trulance  3mg  po daily sent in

## 2024-03-23 ENCOUNTER — Telehealth: Payer: Self-pay | Admitting: Physician Assistant

## 2024-03-23 DIAGNOSIS — R1032 Left lower quadrant pain: Secondary | ICD-10-CM

## 2024-03-23 DIAGNOSIS — K5732 Diverticulitis of large intestine without perforation or abscess without bleeding: Secondary | ICD-10-CM

## 2024-03-23 MED ORDER — METRONIDAZOLE 500 MG PO TABS: 500.0000 mg | ORAL_TABLET | Freq: Three times a day (TID) | ORAL | 0 refills | Status: AC

## 2024-03-23 MED ORDER — CEPHALEXIN 500 MG PO CAPS: 500.0000 mg | ORAL_CAPSULE | Freq: Three times a day (TID) | ORAL | 0 refills | Status: AC

## 2024-03-23 NOTE — Telephone Encounter (Signed)
 Please advise, Jillian [name] is off. Patient having moderate-severe lower left abdominal pain. No fever, nausea or vomiting. Did a bowel purge Saturday, multiple BMs all day Sunday. Today- LLQ pain, even with coughing, and gassy.  Took Gas-x with no improvement. Concerned possible diverticular flare.

## 2024-03-23 NOTE — Telephone Encounter (Signed)
 No mention of Flagyl  being an issue.  She isn't the best historian. Her last flare was  in the summertime  a few years ago sent by Dr Shepard.  Wasn't able to find anything.

## 2024-03-23 NOTE — Telephone Encounter (Signed)
 Spoke with patient. Advised patient medications sent to her pharmacy. Provided instructions that if she starts to have more pain, fever, nausea, vomiting, worsening bloating to be seen at ED. Provided instructions for patient on taking medications and to seek emergency care if she starts developing signs of reaction such as severe rash, shortness of breath, throat swelling.  Patient verbalized understanding of all.

## 2024-03-23 NOTE — Telephone Encounter (Signed)
Thanks for your assist!

## 2024-03-23 NOTE — Telephone Encounter (Signed)
 Inbound call from patient stating she is having severe LLQ abdominal pain. States she thought it was gas and took Gas X but it hs not helped. Requesting a call back. Please advise, thank you

## 2024-03-23 NOTE — Telephone Encounter (Signed)
 Spoke with patient.  She has essentially has experienced rash with Amoxicillin , Cipro, Augmentin .  Bactrim she only recalls body aches. Is Bactim an option. Or should I send her to ED?

## 2024-03-25 ENCOUNTER — Emergency Department (HOSPITAL_BASED_OUTPATIENT_CLINIC_OR_DEPARTMENT_OTHER)

## 2024-03-25 ENCOUNTER — Other Ambulatory Visit: Payer: Self-pay

## 2024-03-25 ENCOUNTER — Emergency Department (HOSPITAL_BASED_OUTPATIENT_CLINIC_OR_DEPARTMENT_OTHER): Admission: EM | Admit: 2024-03-25 | Discharge: 2024-03-25 | Disposition: A | Discharge status: Home / Self Care

## 2024-03-25 DIAGNOSIS — E119 Type 2 diabetes mellitus without complications: Secondary | ICD-10-CM | POA: Diagnosis not present

## 2024-03-25 DIAGNOSIS — Z85828 Personal history of other malignant neoplasm of skin: Secondary | ICD-10-CM | POA: Diagnosis not present

## 2024-03-25 DIAGNOSIS — I10 Essential (primary) hypertension: Secondary | ICD-10-CM | POA: Insufficient documentation

## 2024-03-25 DIAGNOSIS — E039 Hypothyroidism, unspecified: Secondary | ICD-10-CM | POA: Insufficient documentation

## 2024-03-25 DIAGNOSIS — I2510 Atherosclerotic heart disease of native coronary artery without angina pectoris: Secondary | ICD-10-CM | POA: Insufficient documentation

## 2024-03-25 DIAGNOSIS — Z7984 Long term (current) use of oral hypoglycemic drugs: Secondary | ICD-10-CM | POA: Diagnosis not present

## 2024-03-25 DIAGNOSIS — Z79899 Other long term (current) drug therapy: Secondary | ICD-10-CM | POA: Diagnosis not present

## 2024-03-25 DIAGNOSIS — Z7982 Long term (current) use of aspirin: Secondary | ICD-10-CM | POA: Diagnosis not present

## 2024-03-25 DIAGNOSIS — K5792 Diverticulitis of intestine, part unspecified, without perforation or abscess without bleeding: Secondary | ICD-10-CM

## 2024-03-25 DIAGNOSIS — Z91040 Latex allergy status: Secondary | ICD-10-CM | POA: Insufficient documentation

## 2024-03-25 DIAGNOSIS — K5732 Diverticulitis of large intestine without perforation or abscess without bleeding: Secondary | ICD-10-CM | POA: Insufficient documentation

## 2024-03-25 DIAGNOSIS — R1030 Lower abdominal pain, unspecified: Secondary | ICD-10-CM | POA: Diagnosis present

## 2024-03-25 LAB — COMPREHENSIVE METABOLIC PANEL WITH GFR
ALT: 35 U/L (ref 0–44)
AST: 67 U/L — ABNORMAL HIGH (ref 15–41)
Albumin: 4.1 g/dL (ref 3.5–5.0)
Alkaline Phosphatase: 111 U/L (ref 38–126)
Anion gap: 13 (ref 5–15)
BUN: 14 mg/dL (ref 8–23)
CO2: 23 mmol/L (ref 22–32)
Calcium: 10.5 mg/dL — ABNORMAL HIGH (ref 8.9–10.3)
Chloride: 102 mmol/L (ref 98–111)
Creatinine, Ser: 0.74 mg/dL (ref 0.44–1.00)
GFR, Estimated: >60 mL/min
Glucose, Bld: 110 mg/dL — ABNORMAL HIGH (ref 70–99)
Potassium: 5.5 mmol/L — ABNORMAL HIGH (ref 3.5–5.1)
Sodium: 138 mmol/L (ref 135–145)
Total Bilirubin: 0.6 mg/dL (ref 0.0–1.2)
Total Protein: 8.1 g/dL (ref 6.5–8.1)

## 2024-03-25 LAB — URINALYSIS, ROUTINE W REFLEX MICROSCOPIC
Bilirubin Urine: NEGATIVE
Glucose, UA: NEGATIVE mg/dL
Hgb urine dipstick: NEGATIVE
Ketones, ur: NEGATIVE mg/dL
Leukocytes,Ua: NEGATIVE
Nitrite: NEGATIVE
Protein, ur: NEGATIVE mg/dL
Specific Gravity, Urine: 1.011 (ref 1.005–1.030)
pH: 6 (ref 5.0–8.0)

## 2024-03-25 LAB — CBC
HCT: 38.7 % (ref 36.0–46.0)
Hemoglobin: 12.6 g/dL (ref 12.0–15.0)
MCH: 28.9 pg (ref 26.0–34.0)
MCHC: 32.6 g/dL (ref 30.0–36.0)
MCV: 88.8 fL (ref 80.0–100.0)
Platelets: 198 10*3/uL (ref 150–400)
RBC: 4.36 MIL/uL (ref 3.87–5.11)
RDW: 13.3 % (ref 11.5–15.5)
WBC: 10.8 10*3/uL — ABNORMAL HIGH (ref 4.0–10.5)
nRBC: 0 % (ref 0.0–0.2)

## 2024-03-25 LAB — LIPASE, BLOOD: Lipase: 26 U/L (ref 11–51)

## 2024-03-25 MED ORDER — AMOXICILLIN-POT CLAVULANATE 875-125 MG PO TABS: 1.0000 | ORAL_TABLET | Freq: Two times a day (BID) | ORAL | 0 refills | Status: AC

## 2024-03-25 MED ORDER — ONDANSETRON HCL 4 MG PO TABS: 4.0000 mg | ORAL_TABLET | Freq: Four times a day (QID) | ORAL | 0 refills | Status: AC

## 2024-03-25 MED ORDER — METHYLPREDNISOLONE SODIUM SUCC 40 MG IJ SOLR
40.0000 mg | Freq: Once | INTRAMUSCULAR | Status: DC
  Filled 2024-03-25: qty 1

## 2024-03-25 MED ORDER — ONDANSETRON HCL 4 MG/2ML IJ SOLN
4.0000 mg | Freq: Once | INTRAMUSCULAR | Status: AC
  Administered 2024-03-25: 4 mg via INTRAVENOUS
  Filled 2024-03-25: qty 2

## 2024-03-25 MED ORDER — AMOXICILLIN-POT CLAVULANATE 875-125 MG PO TABS
1.0000 | ORAL_TABLET | Freq: Once | ORAL | Status: AC
  Administered 2024-03-25: 1 via ORAL
  Filled 2024-03-25: qty 1

## 2024-03-25 MED ORDER — DICYCLOMINE HCL 20 MG PO TABS: 20.0000 mg | ORAL_TABLET | Freq: Two times a day (BID) | ORAL | 0 refills | Status: AC

## 2024-03-25 MED ORDER — MORPHINE SULFATE (PF) 4 MG/ML IV SOLN
4.0000 mg | Freq: Once | INTRAVENOUS | Status: AC
  Administered 2024-03-25: 4 mg via INTRAVENOUS
  Filled 2024-03-25: qty 1

## 2024-03-25 MED ORDER — DIPHENHYDRAMINE HCL 50 MG/ML IJ SOLN: 50.0000 mg | Freq: Once | INTRAMUSCULAR | Status: DC

## 2024-03-25 MED ORDER — HYDROCODONE-ACETAMINOPHEN 5-325 MG PO TABS: 2.0000 | ORAL_TABLET | ORAL | 0 refills | Status: AC | PRN

## 2024-03-25 MED ORDER — DIPHENHYDRAMINE HCL 25 MG PO CAPS: 50.0000 mg | ORAL_CAPSULE | Freq: Once | ORAL | Status: DC

## 2024-03-25 NOTE — ED Triage Notes (Signed)
 Pt caox4 ambulatory c/o lower abd pain x4 days, recently tx for diverticulitis and constipation. Still on abx. 100.7 temp last night. Has been taking Keflex  but did not take the Flagyl  because she doesn't like how it makes her feel.

## 2024-03-25 NOTE — Discharge Instructions (Signed)
 ### Acute Uncomplicated Diverticulitis: Patient Information and Treatment Instructions     ## What is Diverticulitis?        Diverticulitis is inflammation of small pouches (called diverticula) in your colon. You have been diagnosed with **uncomplicated diverticulitis**, which means the inflammation is localized without complications like abscesses or perforations. Most patients recover fully with proper treatment at home.[1]      ## Your Medications        You have been prescribed the following medications:      **Antibiotics (oral)**: Take exactly as prescribed for the full course, even if you start feeling better. These fight the infection causing your diverticulitis.[1][2]      **Dicyclomine  (Bentyl )**: This medication helps relieve abdominal cramping and pain by relaxing the muscles in your intestines.      **Ondansetron  (Zofran )**: This medication prevents nausea and vomiting. Take as needed when you feel nauseated.      **Hydrocodone **: This pain medication should be taken as prescribed for pain control. Do not drive or operate machinery while taking this medication.      ## Diet Instructions        **Days 1-2: Clear Liquid Diet**      Start with a clear liquid diet for the first 1-2 days to give your bowel a chance to rest.[1][2] Clear liquids include:      - Water , clear broth, or bouillon      - Clear fruit juices (apple, white grape)      - Plain gelatin (Jell-O)      - Popsicles without fruit pieces      - Tea or coffee without milk or cream      - Sports drinks (Gatorade, Pedialyte)      **Days 3-5: Advancing Your Diet**      As your symptoms improve (less pain, no fever, improved appetite), you can slowly advance your diet:[1][3]      - Start adding low-fiber foods like white rice, white bread, cooked vegetables without skins      - Gradually add soft foods like eggs, yogurt, cottage cheese      - Continue to avoid high-fiber foods, nuts, seeds, and  raw vegetables during the acute phase      **After Recovery**      Once your symptoms have completely resolved, you can return to your normal diet. If you cannot advance your diet after 3-5 days, contact your doctor immediately.[2]      ## What to Expect        Most patients with uncomplicated diverticulitis improve within 48-72 hours of starting treatment, with complete resolution usually within 1-2 weeks.[1] You should notice:      - Decreasing abdominal pain      - Improved appetite      - Resolution of fever (if present)      - Ability to tolerate more foods      ## When to Seek Immediate Medical Care        **Go to the emergency room or call 911 if you experience:**      - Severe or worsening abdominal pain, especially if it becomes constant or spreads across your abdomen[1][4]      - Abdominal rigidity or hardness when you press on your belly[4]      - High fever (temperature above 101.68F or 38.6C) or persistent fever despite treatment[3][1]      - Signs of shock: rapid heartbeat, low blood pressure, dizziness, cold and clammy skin[1]      -  Persistent vomiting that prevents you from keeping down liquids or medications[2]      - No improvement or worsening symptoms after 3 days of treatment[1]      - Blood in your stool (this is uncommon with diverticulitis and may indicate another problem)[4]      - Inability to urinate or severe decrease in urination      ## Medication Allergic Reactions        **Stop taking the medication immediately and seek emergency care if you develop:**      - **Severe allergic reaction (anaphylaxis)**: difficulty breathing, wheezing, throat tightness or swelling, swelling of face/lips/tongue, severe rash or hives      - **Severe skin reactions**: widespread rash, blistering, peeling skin, painful red or purple skin      - **Severe abdominal symptoms**: severe diarrhea (especially if bloody or watery), severe abdominal cramping       **Contact your doctor promptly if you experience:**      - Mild to moderate rash or itching      - Persistent nausea, vomiting, or diarrhea      - Severe headache or dizziness      - Confusion or unusual drowsiness (especially with hydrocodone )      - Yellowing of skin or eyes      - Dark urine or pale stools      - Any other concerning symptoms      ## Important Safety Information        - **Antibiotics**: Complete the full course even if you feel better. Stopping early can lead to antibiotic resistance or recurrence of infection.      - **Hydrocodone **: This is a narcotic pain medication. Use only as prescribed. It can cause drowsiness, constipation, and dependence. Do not drink alcohol  while taking this medication.      - **Dicyclomine **: May cause dry mouth, blurred vision, or drowsiness. Use caution when driving.      - **Ondansetron **: Generally well-tolerated but may cause constipation or headache.      ## Follow-Up Care        - You should be reevaluated by your doctor within 7 days to ensure your symptoms are resolving[1]      - If you are not improving after 3-5 days of treatment, contact your doctor sooner[1]      - Your doctor may recommend a colonoscopy 6-8 weeks after your symptoms resolve to ensure there are no other problems      ## Questions?        If you have any questions or concerns about your treatment, do not hesitate to contact your healthcare provider.      ### References  1. Diverticulitis. Delores CHARLYNN Wilfred MARLA Claudene BILLI Carlin RONAL JAMA. [phone]. doi:10.1001/jama.[id number]. 2. [facility] Clinical Practice Update on Medical Management of Colonic Diverticulitis: Expert Review. Peery AF, Shaukat A, Strate LL. Gastroenterology. 2021;160(3):906-911.e1. doi:10.1053/j.[email or web address].[email or web address]. 3. Diverticulitis. Young-Fadok TM. The [place] Journal of Medicine. 2018;379(17):1635-1642. doi:10.1056/[id number]. 4. Colorectal Cancer Screening and  Surveillance and Other Colon Conditions in the Older Adult. Calderwood AH, Shaukat A. The American Journal of Gastroenterology. 2025;120(Suppl 10):S8-S16. doi:10.14309/ajg.[email or web address].

## 2024-03-25 NOTE — ED Provider Notes (Signed)
 " [facility] AT [facility] Provider Note   CSN: [id number] Arrival date & time: 03/25/24  1128     Patient presents with: Abdominal Pain   Jillian [name] is a 79 y.o. female who presents with complaint of abdominal pain.  She has an extensive allergy  list.  Patient reports that she has been having lower abdominal pain and constipation.  She was seen by her GI PA last week had a digital rectal exam that showed firm stool in her rectal vault.  She was given a cleanout with MiraLAX  and Dulcolax.  She had multiple watery stools.  She reports that after that she began having pain all across her abdomen and left side.  It is now worse when she coughs or moves.  Patient reports that she called the PA back and was given Keflex  and Flagyl  for treatment of suspected diverticulitis which she has a history of.  Patient reports that she only took the Keflex  because she does not like the way the Flagyl  makes her feel.  She reports that she has an allergy  to amoxicillin  and penicillin because it made my face go numb on one side.  She denies any history of hives, throat swelling, loss of consciousness, blistering rash, vomiting, sudden onset abdominal pain or wheezing.    Abdominal Pain      Prior to Admission medications  Medication Sig Start Date End Date Taking? Authorizing Provider  ALPRAZolam (XANAX) 0.25 MG tablet Take 0.25 mg by mouth as needed. 07/28/23   [provider]  aspirin  81 MG chewable tablet Chew by mouth daily.    [provider]  cephALEXin  (KEFLEX ) 500 MG capsule Take 1 capsule (500 mg total) by mouth 3 (three) times daily. 03/23/24   Zehr, Jessica D, PA-C  cetirizine (ZYRTEC) 10 MG tablet Take 10 mg by mouth daily. Patient not taking: Reported on 03/10/2024    [provider]  ciclopirox  (LOPROX ) 0.77 % cream Apply topically 2 (two) times daily. Patient not taking: Reported on 03/10/2024 11/05/23   [provider]   Ciclopirox  0.77 % gel Apply to affected area twice daily Patient not taking: Reported on 03/10/2024 12/29/23   Raymund Lauraine BROCKS, MD  clobetasol  ointment (TEMOVATE ) 0.05 % APPLY 1 gram TO VULVAR AREA TWICE DAILY FOR FOUR TO SIX WEEKS. THEN TRANSITION TO THREE TIMES WEEKLY. CAN INCREASE AS NEEDED FOR FLARES Patient not taking: Reported on 03/10/2024 01/13/24   Raymund Lauraine BROCKS, MD  cyclobenzaprine  (FLEXERIL ) 10 MG tablet Take 10 mg by mouth 3 (three) times daily as needed for muscle spasms.    [provider]  EUTHYROX  137 MCG tablet Take 137 mcg by mouth daily before breakfast. Patient not taking: Reported on 03/10/2024    [provider]  famotidine (PEPCID) 20 MG tablet Take 20 mg by mouth as needed. Patient not taking: Reported on 03/10/2024 05/28/22   [provider]  fexofenadine (ALLEGRA) 180 MG tablet Take 180 mg by mouth daily as needed for allergies or rhinitis.    [provider]  fluticasone (FLONASE) 50 MCG/ACT nasal spray 2 spray in each nostril Nasally daily; Duration: 30 days Patient not taking: Reported on 03/10/2024    [provider]  furosemide (LASIX) 40 MG tablet Take 40 mg by mouth as needed. Patient not taking: Reported on 03/10/2024 10/02/16   [provider]  magnesium  hydroxide (MILK OF MAGNESIA) 400 MG/5ML suspension Take 15 mLs by mouth daily as needed for moderate constipation.  [provider]  Menthol -Zinc Oxide (CALMOSEPTINE) 0.44-20.6 % OINT Apply topically as needed. Patient not taking: Reported on 03/10/2024    [provider]  metFORMIN (GLUCOPHAGE-XR) 500 MG 24 hr tablet Take 1,000 mg by mouth 2 (two) times daily.    [provider]  metroNIDAZOLE  (FLAGYL ) 500 MG tablet Take 1 tablet (500 mg total) by mouth 3 (three) times daily. 03/23/24   Zehr, Jessica D, PA-C  miconazole  (MICOTIN) 2 % powder Apply topically as needed for itching. Patient not taking: Reported on 03/10/2024    [provider]  omeprazole  (PRILOSEC OTC) 20 MG tablet Take 20 mg by mouth daily. Patient not taking: Reported on 03/10/2024    [provider]  ondansetron  (ZOFRAN ) 4 MG tablet Take 1 tablet (4 mg total) by mouth every 6 (six) hours as needed for nausea. Patient not taking: Reported on 03/10/2024 04/29/23   Edmisten, Roxie L, PA  ondansetron  (ZOFRAN -ODT) 4 MG disintegrating tablet Take 4 mg by mouth 2 (two) times daily. Patient not taking: Reported on 03/10/2024 09/22/23   [provider]  Plecanatide  (TRULANCE ) 3 MG TABS Take 1 tablet (3 mg total) by mouth daily. 03/16/24 06/14/24  May, Deanna J, NP  polyethylene glycol powder (GLYCOLAX /MIRALAX ) 17 GM/SCOOP powder Take 1 dose once to twice daily by mouth in 8 ounces of fluid Patient not taking: Reported on 03/10/2024 12/23/19   Esterwood, Amy S, PA-C  predniSONE  (DELTASONE ) 50 MG tablet Give 13 hours, 7 hours, and 1 hour prior to contrast dye injection. Notify pharmacy to schedule dose as appropriate based on procedure date/time. Patient not taking: Reported on 03/10/2024 11/20/23   Craig Alan SAUNDERS, PA-C  rosuvastatin  (CRESTOR ) 20 MG tablet Take 20 mg by mouth daily.    [provider]  Simethicone (GAS-X PO) Take by mouth. Patient not taking: Reported on 03/10/2024    [provider]  tacrolimus  (PROTOPIC ) 0.1 % ointment APPLY TWO GRAM TO THE AFFECTED AREAS OF SKIN TWICE DAILY FOR A 30 DAY SUPPLY Patient not taking: Reported on 03/10/2024 12/29/23   Raymund Lauraine BROCKS, MD  telmisartan (MICARDIS) 80 MG tablet Take 80 mg by mouth daily before breakfast. 10/02/16   [provider]    Allergies: Hydrocodone -acetaminophen , Nortriptyline, Oxycodone , Metronidazole , Sulfamethoxazole, Amoxicillin , Chlorhexidine , Luminal [phenobarbital], Mobic [meloxicam], Onion, Penicillins, Allopurinol, Ciprofloxacin, Doxycycline, Iodinated contrast media, Latex, Tape, and Toradol [ketorolac tromethamine]    Review of Systems   Gastrointestinal:  Positive for abdominal pain.    Updated Vital Signs BP (!) 147/80   Pulse 90   Temp 98.2 F (36.8 C) (Oral)   Resp 16   Ht 5' 4 (1.626 m)   Wt 76.7 kg   SpO2 97%   BMI 29.01 kg/m   Physical Exam Vitals and nursing note reviewed.  Constitutional:      General: She is not in acute distress.    Appearance: She is well-developed. She is not diaphoretic.  HENT:     Head: Normocephalic and atraumatic.     Right Ear: External ear normal.     Left Ear: External ear normal.     Nose: Nose normal.     Mouth/Throat:     Mouth: Mucous membranes are moist.  Eyes:     General: No scleral icterus.    Conjunctiva/sclera: Conjunctivae normal.  Cardiovascular:     Rate and Rhythm: Normal rate and regular rhythm.     Heart sounds: Normal heart sounds. No murmur heard.    No friction rub. No  gallop.  Pulmonary:     Effort: Pulmonary effort is normal. No respiratory distress.     Breath sounds: Normal breath sounds.  Abdominal:     General: Abdomen is protuberant. Bowel sounds are normal. There is distension.     Palpations: Abdomen is soft. There is no mass.     Tenderness: There is abdominal tenderness in the right lower quadrant, suprapubic area and left lower quadrant. There is no guarding.  Musculoskeletal:     Cervical back: Normal range of motion.  Skin:    General: Skin is warm and dry.  Neurological:     Mental Status: She is alert and oriented to person, place, and time.  Psychiatric:        Behavior: Behavior normal.     (all labs ordered are listed, but only abnormal results are displayed) Labs Reviewed  COMPREHENSIVE METABOLIC PANEL WITH GFR - Abnormal; Notable for the following components:      Result Value   Potassium 5.5 (*)    Glucose, Bld 110 (*)    Calcium  10.5 (*)    AST 67 (*)    All other components within normal limits  CBC - Abnormal; Notable for the following components:   WBC 10.8 (*)    All other components within normal  limits  LIPASE, BLOOD  URINALYSIS, ROUTINE W REFLEX MICROSCOPIC  LIPASE, BLOOD    EKG: None  Radiology: No results found.   Procedures   Medications Ordered in the ED  morphine  (PF) 4 MG/ML injection 4 mg (4 mg Intravenous Given 03/25/24 1539)  ondansetron  (ZOFRAN ) injection 4 mg (4 mg Intravenous Given 03/25/24 1539)                                    Medical Decision Making Amount and/or Complexity of Data Reviewed Labs: ordered. Radiology: ordered.  Risk Prescription drug management.   This patient presents to the ED for concern of abdominal pain , this involves an extensive number of treatment options, and is a complaint that carries with it a high risk of complications and morbidity.  The differential diagnosis for generalized abdominal pain includes, but is not limited to AAA, gastroenteritis, appendicitis, Bowel obstruction, Bowel perforation. Gastroparesis, DKA, Hernia, Inflammatory bowel disease, mesenteric ischemia, pancreatitis, peritonitis SBP, volvulus.   Co morbidities:  Past Medical History:  Diagnosis Date   Allergy     Anxiety    Basal cell carcinoma    Nose treated in the past w/MOHS   Bowel obstruction (HCC)    Cancer (HCC)    skin cancer had Mohs Surgery   Cataract    Coronary artery disease    Diabetes mellitus without complication (HCC)    Diverticulitis    GERD (gastroesophageal reflux disease)    Gout    H/O unstable angina    Headache    High cholesterol    History of kidney stones    Hypertension    Hypothyroidism    IBS (irritable bowel syndrome)    IBS (irritable bowel syndrome)    Neck pain    PONV (postoperative nausea and vomiting)    Renovascular hypertension    Spondylosis of lumbar spine      Social Determinants of Health:   SDOH Screenings   Food Insecurity: No Food Insecurity (04/28/2023)  Housing: Low Risk (04/28/2023)  Transportation Needs: No Transportation Needs (04/28/2023)  Utilities: Not At Risk (04/28/2023)   Social Connections:  Socially Integrated (04/28/2023)  Tobacco Use: Low Risk (12/15/2023)     Additional history:  {Additional history obtained from husband Records reviewed including previous CT abd and pelvis  Lab Tests:  I Ordered, and personally interpreted labs.  The pertinent results include:   Labs show potassium 5.5 appears to be secondary to hemolysis.  Glucose 110 with history of diabetes, AST slightly elevated at 67 calcium  10.5. White blood cell count 10.8 urine is negative.   Imaging Studies:  I ordered imaging studies including CT abdomen pelvis without contrast I independently visualized and interpreted imaging which showed acute uncomplicated sigmoid diverticulitis I agree with the radiologist interpretation  Cardiac Monitoring/ECG:  The patient was maintained on a cardiac monitor.  I personally viewed and interpreted the cardiac monitored which showed an underlying rhythm of:   I reviewed the patient's EKG which showed normal sinus rhythm  Medicines ordered and prescription drug management:  I ordered medication including  Medications  amoxicillin -clavulanate (AUGMENTIN ) 875-125 MG per tablet 1 tablet (has no administration in time range)  morphine  (PF) 4 MG/ML injection 4 mg (4 mg Intravenous Given 03/25/24 1539)  ondansetron  (ZOFRAN ) injection 4 mg (4 mg Intravenous Given 03/25/24 1539)   for pain in her abdomen and Reevaluation of the patient after these medicines showed that the patient improved I have reviewed the patients home medicines and have made adjustments as needed  Test Considered:   I considered a abdomen pelvis with contrast however she has a contrast allergy  and given her body habitus a noncontrast CT should suffice  Critical Interventions:    Consultations Obtained:   Problem List / ED Course:     ICD-10-CM   1. Acute diverticulitis  K57.92       MDM: Patient here with a history of diverticulitis increasing pain.  She was  only taking the Keflex  she was prescribed did not take Flagyl  she was therefore not actually covering the bacteria that could be causing her diverticulitis.  Patient reports that she had an episode of facial numbness 1 time when taking amoxicillin .  Do not think this qualifies as an allergic reaction.  She was given a dose of Augmentin  here.  She be discharged on Augmentin  and she will be given liquid diet and strict return precautions.  Pain meds ordered, PDMP reviewed.  Discussed outpatient follow-up and return precautions.  She is appropriate for discharge at this time    Dispostion:  After consideration of the diagnostic results and the patients response to treatment, I feel that the patent would benefit from discharge with strict return precautions.      Final diagnoses:  None    ED Discharge Orders     None          Arloa Chroman, PA-C 03/25/24 ARDELLA Doretha Folks, MD 03/25/24 2329  "

## 2024-04-09 ENCOUNTER — Ambulatory Visit: Admitting: Internal Medicine

## 2024-04-15 ENCOUNTER — Ambulatory Visit

## 2024-06-28 ENCOUNTER — Ambulatory Visit
